# Patient Record
Sex: Female | Born: 1960
Health system: Southern US, Community
[De-identification: ages and names within clinical notes are randomized; demographics above are authoritative.]

## PROBLEM LIST (undated history)

## (undated) DIAGNOSIS — K222 Esophageal obstruction: Secondary | ICD-10-CM

## (undated) DIAGNOSIS — Z973 Presence of spectacles and contact lenses: Secondary | ICD-10-CM

## (undated) DIAGNOSIS — F32A Depression, unspecified: Secondary | ICD-10-CM

## (undated) DIAGNOSIS — J45909 Unspecified asthma, uncomplicated: Secondary | ICD-10-CM

## (undated) DIAGNOSIS — M549 Dorsalgia, unspecified: Secondary | ICD-10-CM

## (undated) DIAGNOSIS — K219 Gastro-esophageal reflux disease without esophagitis: Secondary | ICD-10-CM

## (undated) DIAGNOSIS — K6389 Other specified diseases of intestine: Secondary | ICD-10-CM

## (undated) DIAGNOSIS — K224 Dyskinesia of esophagus: Secondary | ICD-10-CM

## (undated) DIAGNOSIS — M419 Scoliosis, unspecified: Secondary | ICD-10-CM

## (undated) DIAGNOSIS — K638219 Small intestinal bacterial overgrowth, unspecified: Secondary | ICD-10-CM

## (undated) DIAGNOSIS — N200 Calculus of kidney: Secondary | ICD-10-CM

## (undated) DIAGNOSIS — R51 Headache: Secondary | ICD-10-CM

## (undated) DIAGNOSIS — K200 Eosinophilic esophagitis: Secondary | ICD-10-CM

## (undated) DIAGNOSIS — F419 Anxiety disorder, unspecified: Secondary | ICD-10-CM

## (undated) DIAGNOSIS — M810 Age-related osteoporosis without current pathological fracture: Secondary | ICD-10-CM

## (undated) DIAGNOSIS — K2970 Gastritis, unspecified, without bleeding: Secondary | ICD-10-CM

## (undated) DIAGNOSIS — M8580 Other specified disorders of bone density and structure, unspecified site: Secondary | ICD-10-CM

## (undated) DIAGNOSIS — R519 Headache, unspecified: Secondary | ICD-10-CM

## (undated) DIAGNOSIS — E559 Vitamin D deficiency, unspecified: Secondary | ICD-10-CM

## (undated) DIAGNOSIS — H1010 Acute atopic conjunctivitis, unspecified eye: Secondary | ICD-10-CM

## (undated) DIAGNOSIS — T7840XA Allergy, unspecified, initial encounter: Secondary | ICD-10-CM

## (undated) DIAGNOSIS — G8929 Other chronic pain: Secondary | ICD-10-CM

## (undated) DIAGNOSIS — K589 Irritable bowel syndrome without diarrhea: Secondary | ICD-10-CM

## (undated) DIAGNOSIS — J302 Other seasonal allergic rhinitis: Secondary | ICD-10-CM

## (undated) DIAGNOSIS — F329 Major depressive disorder, single episode, unspecified: Secondary | ICD-10-CM

## (undated) HISTORY — DX: Vitamin D deficiency, unspecified: E55.9

## (undated) HISTORY — DX: Dyskinesia of esophagus: K22.4

## (undated) HISTORY — DX: Other specified disorders of bone density and structure, unspecified site: M85.80

## (undated) HISTORY — PX: APPENDECTOMY: SHX54

## (undated) HISTORY — PX: ESOPHAGEAL DILATION: SHX303

## (undated) HISTORY — DX: Headache: R51

## (undated) HISTORY — PX: BREAST EXCISIONAL BIOPSY: SUR124

## (undated) HISTORY — DX: Depression, unspecified: F32.A

## (undated) HISTORY — DX: Acute atopic conjunctivitis, unspecified eye: H10.10

## (undated) HISTORY — DX: Esophageal obstruction: K22.2

## (undated) HISTORY — DX: Allergy, unspecified, initial encounter: T78.40XA

## (undated) HISTORY — DX: Headache, unspecified: R51.9

## (undated) HISTORY — DX: Presence of spectacles and contact lenses: Z97.3

## (undated) HISTORY — DX: Other seasonal allergic rhinitis: J30.2

## (undated) HISTORY — DX: Age-related osteoporosis without current pathological fracture: M81.0

## (undated) HISTORY — PX: LITHOTRIPSY: SUR834

## (undated) HISTORY — DX: Dorsalgia, unspecified: M54.9

## (undated) HISTORY — DX: Anxiety disorder, unspecified: F41.9

## (undated) HISTORY — DX: Other specified diseases of intestine: K63.89

## (undated) HISTORY — DX: Other chronic pain: G89.29

## (undated) HISTORY — DX: Scoliosis, unspecified: M41.9

## (undated) HISTORY — DX: Gastritis, unspecified, without bleeding: K29.70

## (undated) HISTORY — DX: Eosinophilic esophagitis: K20.0

## (undated) HISTORY — DX: Small intestinal bacterial overgrowth, unspecified: K63.8219

---
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
## 1984-03-24 HISTORY — PX: KIDNEY STONE SURGERY: SHX686

## 2004-06-18 ENCOUNTER — Emergency Department (HOSPITAL_COMMUNITY): Admission: EM | Admit: 2004-06-18 | Discharge: 2004-06-18 | Payer: Self-pay | Admitting: Emergency Medicine

## 2005-03-17 ENCOUNTER — Emergency Department (HOSPITAL_COMMUNITY): Admission: EM | Admit: 2005-03-17 | Discharge: 2005-03-17 | Payer: Self-pay | Admitting: Emergency Medicine

## 2010-12-28 ENCOUNTER — Emergency Department (HOSPITAL_COMMUNITY)
Admission: EM | Admit: 2010-12-28 | Discharge: 2010-12-28 | Disposition: A | Discharge status: Home / Self Care | Payer: 59 | Attending: Emergency Medicine | Admitting: Emergency Medicine

## 2010-12-28 DIAGNOSIS — R42 Dizziness and giddiness: Secondary | ICD-10-CM | POA: Insufficient documentation

## 2010-12-28 DIAGNOSIS — R55 Syncope and collapse: Secondary | ICD-10-CM | POA: Insufficient documentation

## 2010-12-28 DIAGNOSIS — F411 Generalized anxiety disorder: Secondary | ICD-10-CM | POA: Insufficient documentation

## 2010-12-28 LAB — POCT I-STAT, CHEM 8
Calcium, Ion: 1.19 mmol/L (ref 1.12–1.32)
Creatinine, Ser: 0.9 mg/dL (ref 0.50–1.10)
Hemoglobin: 12.6 g/dL (ref 12.0–15.0)
Sodium: 138 mEq/L (ref 135–145)
TCO2: 23 mmol/L (ref 0–100)

## 2010-12-28 LAB — URINALYSIS, ROUTINE W REFLEX MICROSCOPIC
Bilirubin Urine: NEGATIVE
Ketones, ur: 15 mg/dL — AB
Protein, ur: NEGATIVE mg/dL
Urobilinogen, UA: 0.2 mg/dL (ref 0.0–1.0)

## 2010-12-28 LAB — URINE MICROSCOPIC-ADD ON

## 2011-03-25 HISTORY — PX: COLONOSCOPY: SHX174

## 2013-03-24 DIAGNOSIS — N200 Calculus of kidney: Secondary | ICD-10-CM

## 2013-03-24 HISTORY — DX: Calculus of kidney: N20.0

## 2013-06-03 ENCOUNTER — Other Ambulatory Visit (HOSPITAL_COMMUNITY): Payer: Self-pay | Admitting: Chiropractic Medicine

## 2013-06-03 ENCOUNTER — Ambulatory Visit (HOSPITAL_COMMUNITY)
Admission: RE | Admit: 2013-06-03 | Discharge: 2013-06-03 | Disposition: A | Discharge status: Home / Self Care | Payer: BC Managed Care – PPO | Source: Ambulatory Visit | Attending: Chiropractic Medicine | Admitting: Chiropractic Medicine

## 2013-06-03 DIAGNOSIS — M419 Scoliosis, unspecified: Secondary | ICD-10-CM

## 2013-06-03 DIAGNOSIS — M4120 Other idiopathic scoliosis, site unspecified: Secondary | ICD-10-CM | POA: Insufficient documentation

## 2013-06-30 ENCOUNTER — Emergency Department (HOSPITAL_BASED_OUTPATIENT_CLINIC_OR_DEPARTMENT_OTHER)
Admission: EM | Admit: 2013-06-30 | Discharge: 2013-06-30 | Disposition: A | Discharge status: Home / Self Care | Payer: BC Managed Care – PPO | Attending: Emergency Medicine | Admitting: Emergency Medicine

## 2013-06-30 ENCOUNTER — Encounter (HOSPITAL_BASED_OUTPATIENT_CLINIC_OR_DEPARTMENT_OTHER): Payer: Self-pay | Admitting: Emergency Medicine

## 2013-06-30 DIAGNOSIS — K219 Gastro-esophageal reflux disease without esophagitis: Secondary | ICD-10-CM | POA: Insufficient documentation

## 2013-06-30 DIAGNOSIS — R1013 Epigastric pain: Secondary | ICD-10-CM | POA: Insufficient documentation

## 2013-06-30 DIAGNOSIS — R197 Diarrhea, unspecified: Secondary | ICD-10-CM | POA: Insufficient documentation

## 2013-06-30 DIAGNOSIS — R319 Hematuria, unspecified: Secondary | ICD-10-CM | POA: Insufficient documentation

## 2013-06-30 DIAGNOSIS — R110 Nausea: Secondary | ICD-10-CM | POA: Insufficient documentation

## 2013-06-30 DIAGNOSIS — Z87442 Personal history of urinary calculi: Secondary | ICD-10-CM | POA: Insufficient documentation

## 2013-06-30 DIAGNOSIS — J45909 Unspecified asthma, uncomplicated: Secondary | ICD-10-CM | POA: Insufficient documentation

## 2013-06-30 DIAGNOSIS — Z3202 Encounter for pregnancy test, result negative: Secondary | ICD-10-CM | POA: Insufficient documentation

## 2013-06-30 DIAGNOSIS — Z79899 Other long term (current) drug therapy: Secondary | ICD-10-CM | POA: Insufficient documentation

## 2013-06-30 HISTORY — DX: Calculus of kidney: N20.0

## 2013-06-30 HISTORY — DX: Gastro-esophageal reflux disease without esophagitis: K21.9

## 2013-06-30 HISTORY — DX: Irritable bowel syndrome, unspecified: K58.9

## 2013-06-30 HISTORY — DX: Unspecified asthma, uncomplicated: J45.909

## 2013-06-30 LAB — CBC
HEMATOCRIT: 36.4 % (ref 36.0–46.0)
HEMOGLOBIN: 12.6 g/dL (ref 12.0–15.0)
MCH: 32.8 pg (ref 26.0–34.0)
MCHC: 34.6 g/dL (ref 30.0–36.0)
MCV: 94.8 fL (ref 78.0–100.0)
Platelets: 325 10*3/uL (ref 150–400)
RBC: 3.84 MIL/uL — AB (ref 3.87–5.11)
RDW: 11.7 % (ref 11.5–15.5)
WBC: 11.9 10*3/uL — ABNORMAL HIGH (ref 4.0–10.5)

## 2013-06-30 LAB — COMPREHENSIVE METABOLIC PANEL
ALT: 13 U/L (ref 0–35)
AST: 19 U/L (ref 0–37)
Albumin: 4 g/dL (ref 3.5–5.2)
Alkaline Phosphatase: 73 U/L (ref 39–117)
BUN: 13 mg/dL (ref 6–23)
CALCIUM: 9.4 mg/dL (ref 8.4–10.5)
CO2: 26 meq/L (ref 19–32)
CREATININE: 0.7 mg/dL (ref 0.50–1.10)
Chloride: 102 mEq/L (ref 96–112)
GLUCOSE: 111 mg/dL — AB (ref 70–99)
Potassium: 4.5 mEq/L (ref 3.7–5.3)
SODIUM: 140 meq/L (ref 137–147)
Total Bilirubin: 0.5 mg/dL (ref 0.3–1.2)
Total Protein: 7.1 g/dL (ref 6.0–8.3)

## 2013-06-30 LAB — URINALYSIS, ROUTINE W REFLEX MICROSCOPIC
Bilirubin Urine: NEGATIVE
Glucose, UA: NEGATIVE mg/dL
KETONES UR: 15 mg/dL — AB
LEUKOCYTES UA: NEGATIVE
NITRITE: NEGATIVE
PROTEIN: NEGATIVE mg/dL
Specific Gravity, Urine: 1.026 (ref 1.005–1.030)
UROBILINOGEN UA: 0.2 mg/dL (ref 0.0–1.0)
pH: 5.5 (ref 5.0–8.0)

## 2013-06-30 LAB — PREGNANCY, URINE: Preg Test, Ur: NEGATIVE

## 2013-06-30 LAB — URINE MICROSCOPIC-ADD ON

## 2013-06-30 LAB — LIPASE, BLOOD: Lipase: 32 U/L (ref 11–59)

## 2013-06-30 MED ORDER — GI COCKTAIL ~~LOC~~
30.0000 mL | Freq: Once | ORAL | Status: AC
Start: 2013-06-30 — End: 2013-06-30
  Administered 2013-06-30: 30 mL via ORAL
  Filled 2013-06-30: qty 30

## 2013-06-30 NOTE — ED Notes (Signed)
Patient asked to provide clean catch urine sample & to change into a gown.

## 2013-06-30 NOTE — ED Notes (Signed)
Pt c/o upper abdominal pain since this morning that radiated around right flank "one time". Pt reports 1 episode of diarrhea today, denies n/v. Reports h/o kidney stones and reflux. GI Dr is [facility].

## 2013-06-30 NOTE — ED Provider Notes (Signed)
CSN: [id number]     Arrival date & time 06/30/13  1209 History   First MD Initiated Contact with Patient 06/30/13 1335     Chief Complaint  Patient presents with  . Abdominal Pain     (Consider location/radiation/quality/duration/timing/severity/associated sxs/prior Treatment) HPI 53 year old female presents with acute epigastric abdominal pain that started this morning. She states that she woke up normal and then after her couple sips of coffee had acute epigastric pain. She states the pain is been constant since then. His mostly been mild, like a 1/10 pain, but fluctuates into a severe 7/10 pain. Patient states that when the pain is bad she's been getting nauseous. She denies any constipation. She did have one loose, watery bowel movements today but otherwise has not had any diarrhea. She states one time the pain was bad seem to radiate across her abdomen to the right. She currently has no right upper quadrant pain now. No urinary symptoms. She got off her period last week. She has a history of kidney stones states this is totally different than her normal kidney stones. At this time the pain is very mild 1/10 and she's not sure she wants any medicines. Denies any current nausea.  Past Medical History  Diagnosis Date  . IBS (irritable bowel syndrome)   . Asthma   . GERD (gastroesophageal reflux disease)   . Kidney stone    Past Surgical History  Procedure Laterality Date  . Appendectomy     No family history on file. History  Substance Use Topics  . Smoking status: Never Smoker   . Smokeless tobacco: Not on file  . Alcohol Use: 0.6 oz/week    1 Glasses of wine per week   OB History   Grav Para Term Preterm Abortions TAB SAB Ect Mult Living                 Review of Systems  Constitutional: Negative for fever.  Respiratory: Negative for shortness of breath.   Cardiovascular: Negative for chest pain.  Gastrointestinal: Positive for nausea, abdominal pain and diarrhea. Negative  for vomiting and constipation.  Genitourinary: Negative for dysuria, hematuria, decreased urine volume and menstrual problem.  Musculoskeletal: Negative for back pain.  All other systems reviewed and are negative.     Allergies  Review of patient's allergies indicates no known allergies.  Home Medications   Current Outpatient Rx  Name  Route  Sig  Dispense  Refill  . albuterol (PROVENTIL HFA;VENTOLIN HFA) 108 (90 BASE) MCG/ACT inhaler   Inhalation   Inhale into the lungs every 6 (six) hours as needed for wheezing or shortness of breath.         . Esomeprazole Magnesium (NEXIUM PO)   Oral   Take by mouth.          BP 120/64  Pulse 70  Temp(Src) 97.9 F (36.6 C) (Oral)  Resp 16  Ht 5\' 2"  (1.575 m)  Wt 105 lb (47.628 kg)  BMI 19.20 kg/m2  SpO2 98%  LMP 06/22/2013 Physical Exam  Nursing note and vitals reviewed. Constitutional: She is oriented to person, place, and time. She appears well-developed and well-nourished. No distress.  HENT:  Head: Normocephalic and atraumatic.  Right Ear: External ear normal.  Left Ear: External ear normal.  Nose: Nose normal.  Eyes: Right eye exhibits no discharge. Left eye exhibits no discharge.  Cardiovascular: Normal rate, regular rhythm and normal heart sounds.   Pulmonary/Chest: Effort normal and breath sounds normal.  Abdominal: Soft.  She exhibits no distension. There is tenderness (very mild).  Neurological: She is alert and oriented to person, place, and time.  Skin: Skin is warm and dry.    ED Course  Procedures (including critical care time) Labs Review Labs Reviewed  URINALYSIS, ROUTINE W REFLEX MICROSCOPIC - Abnormal; Notable for the following:    Hgb urine dipstick TRACE (*)    Ketones, ur 15 (*)    All other components within normal limits  URINE MICROSCOPIC-ADD ON - Abnormal; Notable for the following:    Squamous Epithelial / LPF FEW (*)    Bacteria, UA MANY (*)    All other components within normal limits   CBC - Abnormal; Notable for the following:    WBC 11.9 (*)    RBC 3.84 (*)    All other components within normal limits  COMPREHENSIVE METABOLIC PANEL - Abnormal; Notable for the following:    Glucose, Bld 111 (*)    All other components within normal limits  PREGNANCY, URINE  LIPASE, BLOOD   Imaging Review No results found.   EKG Interpretation None      MDM   Final diagnoses:  Epigastric abdominal pain    Patient has trace hematuria on urinalysis but no other signs of a kidney stone. She states she's had multiple ureteral stones and not similar. Her abdominal exam is benign with minimal to no tenderness on exam. Her pain could have been coming from gallbladder but as she has no right upper quadrant tenderness now do not feel imaging is needed. She has no urinary symptoms. GI cocktail provided minimal relief. At this time she feels well to go home and there is no significant or obvious cause of her pain. As she is somewhat tender but her symptoms started right after eating and drinking I have very low suspicion that this of chest pathology. Discussed strict return precautions.    Ephraim Hamburger, MD 06/30/13 [phone]

## 2013-06-30 NOTE — Discharge Instructions (Signed)

## 2014-03-09 LAB — HM MAMMOGRAPHY

## 2014-05-30 ENCOUNTER — Telehealth: Payer: Self-pay | Admitting: Medical

## 2014-05-30 ENCOUNTER — Encounter: Payer: Self-pay | Admitting: Medical

## 2014-05-30 ENCOUNTER — Ambulatory Visit (INDEPENDENT_AMBULATORY_CARE_PROVIDER_SITE_OTHER): Payer: 59 | Admitting: Medical

## 2014-05-30 VITALS — BP 100/80 | HR 59 | Resp 16 | Wt 106.0 lb

## 2014-05-30 DIAGNOSIS — M545 Low back pain, unspecified: Secondary | ICD-10-CM

## 2014-05-30 DIAGNOSIS — G8929 Other chronic pain: Secondary | ICD-10-CM

## 2014-05-30 DIAGNOSIS — M4120 Other idiopathic scoliosis, site unspecified: Secondary | ICD-10-CM

## 2014-05-30 DIAGNOSIS — M8580 Other specified disorders of bone density and structure, unspecified site: Secondary | ICD-10-CM | POA: Diagnosis not present

## 2014-05-30 MED ORDER — NAPROXEN 375 MG PO TABS: ORAL_TABLET | ORAL | Status: DC

## 2014-05-30 MED ORDER — METHOCARBAMOL 500 MG PO TABS: ORAL_TABLET | ORAL | Status: DC

## 2014-05-30 NOTE — Telephone Encounter (Signed)
Refer to [facility] for scoliosis, chronic back pain, but worse of recent months.   Send copy of my notes, 2015 xray in chart

## 2014-05-30 NOTE — Telephone Encounter (Signed)
I fax over the patients information to [facility]. The new patient coordinator will contact the patient for the appointment  [facility] [address] [place], [place] # [phone]

## 2014-05-30 NOTE — Progress Notes (Signed)
Subjective Here as a new patient to establish.  Was seeing primary care in [facility], but PCM retired.   Usually not sick much, so no major prior medical problems.    Here for c/o chronic back pain, hx/o scoliosis.  As she has gotten older, back gets worse, interferes with sleep and Yoga.  Does Yoga 2 times per week,works out with trainer twice weekly.  Lately the pain is so bad can't do what she wants.  Pain even aggravates her ability to do Yoga.  Mattress is relatively new.  Not a medication person, but sometimes uses Advil.  Saw back specialist years ago.   Last time for specialist 1990s.   Works as an Optometrist, Engineer, maintenance (IT), sits a lot.  Gets massage, Lenore Manner, body therapy routinely.  Never has seen PT.  Has seen chiropractor from time to time.  Wants to avoid medication daily, and wants to get some new recommendations.  No other aggravating or relieving factors.   Eats healthy, nonsmoker.  Gets mild asthma flares in spring and fall, uses Flovent during seasons, occasional use of albuterol.  No other complaint.   Objective: BP 100/80 mmHg  Pulse 59  Resp 16  Wt 106 lb (48.081 kg)  Gen: wd, wn, lean white female Skin: warm, dry Back: rightward scoliosis, mild tenderness along right paraspinal region, full ROM but with some pain with ROM MSK: legs nontneder, normal ROM, no deformity Ext: no edema Pulses normal  Neuro: normal LE strength, sensation and DTRs    Assessment:  Encounter Diagnoses  Name Primary?  . Idiopathic scoliosis Yes  . Chronic low back pain   . Osteopenia     Plan: Reviewed 2015 xray.  Discussed her concerns.  Given her degrees of scoliosis, long hx/o pain, and desire for options, referral to [facility].  Gave robaxin and Naprosyn for prn use.  discussed risks/benefits of medication.   F/u soon for physical.

## 2014-06-01 ENCOUNTER — Telehealth: Payer: Self-pay | Admitting: Medical

## 2014-06-01 NOTE — Telephone Encounter (Signed)
Records received from previous provider. I am sending back for review.

## 2014-06-02 NOTE — Telephone Encounter (Signed)
Records was sorted and given to [employer]

## 2014-06-05 ENCOUNTER — Telehealth: Payer: Self-pay | Admitting: Family Medicine

## 2014-06-05 NOTE — Telephone Encounter (Signed)
Patient has an appointment at [facility] on 06/20/14 @ 200 pm. Patient is aware of her appointment. [address] [place], [place] [address] [phone]

## 2014-06-06 NOTE — Telephone Encounter (Signed)
error 

## 2014-06-20 ENCOUNTER — Telehealth: Payer: Self-pay | Admitting: Medical

## 2014-06-20 ENCOUNTER — Encounter: Payer: Self-pay | Admitting: Medical

## 2014-06-20 ENCOUNTER — Ambulatory Visit (INDEPENDENT_AMBULATORY_CARE_PROVIDER_SITE_OTHER): Payer: 59 | Admitting: Medical

## 2014-06-20 VITALS — BP 120/80 | HR 63 | Temp 97.7°F | Wt 109.0 lb

## 2014-06-20 DIAGNOSIS — M8580 Other specified disorders of bone density and structure, unspecified site: Secondary | ICD-10-CM | POA: Diagnosis not present

## 2014-06-20 DIAGNOSIS — J309 Allergic rhinitis, unspecified: Secondary | ICD-10-CM | POA: Diagnosis not present

## 2014-06-20 DIAGNOSIS — K219 Gastro-esophageal reflux disease without esophagitis: Secondary | ICD-10-CM

## 2014-06-20 DIAGNOSIS — Z9889 Other specified postprocedural states: Secondary | ICD-10-CM | POA: Diagnosis not present

## 2014-06-20 DIAGNOSIS — E559 Vitamin D deficiency, unspecified: Secondary | ICD-10-CM

## 2014-06-20 DIAGNOSIS — Z0000 Encounter for general adult medical examination without abnormal findings: Secondary | ICD-10-CM

## 2014-06-20 DIAGNOSIS — J4530 Mild persistent asthma, uncomplicated: Secondary | ICD-10-CM | POA: Diagnosis not present

## 2014-06-20 DIAGNOSIS — Z2821 Immunization not carried out because of patient refusal: Secondary | ICD-10-CM

## 2014-06-20 LAB — LIPID PANEL
Cholesterol: 209 mg/dL — ABNORMAL HIGH (ref 0–200)
HDL: 80 mg/dL (ref >=46)
LDL CALC: 110 mg/dL — AB (ref 0–99)
TRIGLYCERIDES: 93 mg/dL (ref <150)
Total CHOL/HDL Ratio: 2.6 Ratio
VLDL: 19 mg/dL (ref 0–40)

## 2014-06-20 LAB — POCT URINALYSIS DIPSTICK
Bilirubin, UA: NEGATIVE
Glucose, UA: NEGATIVE
KETONES UA: NEGATIVE
Leukocytes, UA: NEGATIVE
Nitrite, UA: NEGATIVE
Protein, UA: NEGATIVE
Spec Grav, UA: 1.025
Urobilinogen, UA: NEGATIVE
pH, UA: 6

## 2014-06-20 NOTE — Telephone Encounter (Signed)
Set up for bone density scan, send copy of 2010 study

## 2014-06-20 NOTE — Progress Notes (Signed)
Subjective:   HPI  [name] is a 54 y.o. female who presents for a complete physical.  Medical care team includes:  Dr. Margaretmary Bayley, GI  Dr. Posey Pronto, gynecology  Dr. Denton Lank, optometry  Dr. Jeannetta Ellis, dentist Glade Lloyd, DAVID Audelia Acton, PA-C here for primary care   Preventative care: Last ophthalmology visit:yes Dr. Denton Lank Last dental visit:yes Dr. Jeannetta Ellis Last colonoscopy:yes 3 to 5 years  Last mammogram:yes 11/ 2015 Last gynecological exam: Dr. Posey Pronto, 09/2014 Last EKG:12/2010 Last labs: 07/2013  Prior vaccinations: TD or Tdap:with in the last 10 years Influenza:never Pneumococcal:n/a Shingles/Zostavax:n/a  Concerns: None in particular.  Her history is significant this past year for hospitalization for esophageal stricture, dilatated and then subsequent rupture with repair, long history of GERD, long history of intermittent kidney stones. The past year has been in good health without much problem  Last period 4 months ago, starting slow down, reaching menopause age. no current significant other  No recent flare of asthma but typically gets some flareup in spring or fall, uses albuterol intermittently, will start Zyrtec soon for allergies. Has used Flonase and Flovent in the past Reviewed their medical, surgical, family, social, medication, and allergy history and updated chart as appropriate.  Past Medical History  Diagnosis Date  . IBS (irritable bowel syndrome)   . Asthma   . GERD (gastroesophageal reflux disease)   . Scoliosis   . Kidney stone 2015    gets every few years, hx/o calcium oxalate stones  . Seasonal allergic rhinitis   . Seasonal allergic conjunctivitis   . Wears glasses   . Anxiety   . Headache     occasional  . Chronic back pain   . Osteopenia   . Vitamin D deficiency     Past Surgical History  Procedure Laterality Date  . Appendectomy    . Esophageal dilation      [place], [place]  . Esophagoplasty  06/7338    complication of  esophageal dilitation, hospitalization at [facility]  . Colonoscopy  2013    [place], [place]  . Kidney stone surgery  1986  . Lithotripsy      4x prior as of 05/2014    History   Social History  . Marital Status: Single    Spouse Name: N/A  . Number of Children: N/A  . Years of Education: N/A   Occupational History  . Not on file.   Social History Main Topics  . Smoking status: Never Smoker   . Smokeless tobacco: Not on file  . Alcohol Use: 4.2 oz/week    7 Glasses of wine per week  . Drug Use: No  . Sexual Activity: Not on file   Other Topics Concern  . 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 Diabetes Father   . Kidney Stones Father   . Diabetes Sister   . Kidney Stones Brother   . Migraines Brother   . Heart disease Maternal Grandfather   . Cancer Neg Hx   . Stroke Neg Hx   . Hypertension Neg Hx      Current outpatient prescriptions:  .  albuterol (PROVENTIL HFA;VENTOLIN HFA) 108 (90 BASE) MCG/ACT inhaler, Inhale into the lungs every 6 (six) hours as needed for wheezing or shortness of breath., Disp: , Rfl:  .  methocarbamol (ROBAXIN) 500  MG tablet, 1 tablet up to TID prn, Disp: 30 tablet, Rfl: 0 .  naproxen (NAPROSYN) 375 MG tablet, 1 tablet po up to BID prn, Disp: 60 tablet, Rfl: 0  No Known Allergies   Review of Systems Constitutional: -fever, -chills, -sweats, -unexpected weight change, -decreased appetite, -fatigue Allergy: -sneezing, +itching, -congestion Dermatology: -changing moles, --rash, -lumps ENT: -runny nose, -ear pain, -sore throat, -hoarseness, -sinus pain, -teeth pain, +ringing in ears(seen ear doctor), -hearing loss, -nosebleeds Cardiology: -chest pain, -palpitations, -swelling, -difficulty breathing when lying flat, -waking up short of breath Respiratory: -cough, -shortness of breath, -difficulty  breathing with exercise or exertion, -wheezing, -coughing up blood Gastroenterology: -abdominal pain, -nausea, -vomiting, -diarrhea, -constipation, -blood in stool, -changes in bowel movement, -difficulty swallowing or eating Hematology: -bleeding, -bruising  Musculoskeletal: -joint aches, -muscle aches, -joint swelling, +back pain, -neck pain, -cramping, -changes in gait Ophthalmology: denies vision changes, eye redness, itching, discharge Urology: -burning with urination, -difficulty urinating, -blood in urine, -urinary frequency, -urgency, -incontinence Neurology: -headache, -weakness, -tingling, -numbness, -memory loss, -falls, -dizziness Psychology: -depressed mood, -agitation, -sleep problems     Objective:   Physical Exam  Blood pressure 120/80, pulse 63, temperature 97.7 F (36.5 C), temperature source Oral, weight 109 lb (49.442 kg).   General appearance: alert, no distress, WD/WN, lean white female Skin: Scattered macules no particular worrisome lesions HEENT: normocephalic, conjunctiva/corneas normal, sclerae anicteric, PERRLA, EOMi, nares patent, no discharge or erythema, pharynx normal Oral cavity: MMM, tongue normal, teeth normal Neck: supple, no lymphadenopathy, no thyromegaly, no masses, normal ROM, no bruits Chest: non tender, normal shape and expansion Heart: RRR, normal S1, S2, no murmurs Lungs: CTA bilaterally, no wheezes, rhonchi, or rales Abdomen: +bs, soft, non tender, non distended, no masses, no hepatomegaly, no splenomegaly, no bruits Back: rightward scoliosis, mild tenderness along right paraspinal region, full ROM but with some pain with ROM Musculoskeletal: upper extremities non tender, no obvious deformity, normal ROM throughout, lower extremities non tender, no obvious deformity, normal ROM throughout Extremities: no edema, no cyanosis, no clubbing Pulses: 2+ symmetric, upper and lower extremities, normal cap refill Neurological: alert, oriented x 3, CN2-12  intact, strength normal upper extremities and lower extremities, sensation normal throughout, DTRs 2+ throughout, no cerebellar signs, gait normal Psychiatric: normal affect, behavior normal, pleasant  Breast/gyn/rectal-deferred to gynecology     Assessment and Plan :    Encounter Diagnoses  Name Primary?  . Encounter for health maintenance examination in adult Yes  . Asthma, mild persistent, uncomplicated   . Allergic rhinitis, unspecified allergic rhinitis type   . Gastroesophageal reflux disease without esophagitis   . Vitamin D deficiency   . Osteopenia   . 23-polyvalent pneumococcal polysaccharide vaccine declined   . Influenza vaccination declined     Physical exam - discussed healthy lifestyle, diet, exercise, preventative care, vaccinations, and addressed their concerns.  She is up to date on mammogram, pap, colonsocopy.   We will refer back for updated bone density scan.  Last one was 2010 showing osteopenia. Reviewed 2015 and prior comprehensive labs. Lipid and Vit D level today Will update chart record, reviewed length prior records and old chart. Asthma - albuterol prn.   She has used flovent in the past, usually allergies are a trigger call return if worsening symptoms Allergic rhinitis and conjunctiva - restart Zyrtec GERD-discussed risk and benefits of neck him, and she will try Zantac over-the-counter for the next several weeks to see if this works okay to potentially come off Nexium Osteopenia-discussed the need for continued  good healthy diet and exercise including weight-bearing exercise, needs to get vitamin D in the diet Declines flu and pneumococcal vaccines Follow-up pending labs

## 2014-06-20 NOTE — Telephone Encounter (Signed)
Fax orders over to [facility] and they will contact the patient

## 2014-06-21 LAB — VITAMIN D 25 HYDROXY (VIT D DEFICIENCY, FRACTURES): Vit D, 25-Hydroxy: 60 ng/mL (ref 30–100)

## 2014-06-29 LAB — HM DEXA SCAN

## 2014-07-03 ENCOUNTER — Other Ambulatory Visit: Payer: Self-pay | Admitting: Orthopaedic Surgery

## 2014-07-03 DIAGNOSIS — M47817 Spondylosis without myelopathy or radiculopathy, lumbosacral region: Secondary | ICD-10-CM

## 2014-07-05 ENCOUNTER — Encounter: Payer: Self-pay | Admitting: Internal Medicine

## 2014-07-14 ENCOUNTER — Ambulatory Visit
Admission: RE | Admit: 2014-07-14 | Discharge: 2014-07-14 | Disposition: A | Discharge status: Home / Self Care | Payer: 59 | Source: Ambulatory Visit | Attending: Orthopaedic Surgery | Admitting: Orthopaedic Surgery

## 2014-07-14 DIAGNOSIS — M47817 Spondylosis without myelopathy or radiculopathy, lumbosacral region: Secondary | ICD-10-CM

## 2014-07-19 ENCOUNTER — Telehealth: Payer: Self-pay | Admitting: Family Medicine

## 2014-07-19 NOTE — Telephone Encounter (Signed)
I don't see results in the computer so please check the scan file or find the result

## 2014-07-19 NOTE — Telephone Encounter (Signed)
I called over over to [facility] to get another copy of her bone density scan because I look up front and i didn't see a copy anywhwere I called the patient and told her that I got her message and that I was calling [facility] to get a copy of the bone density and we would contact her before the week is out

## 2014-07-19 NOTE — Telephone Encounter (Signed)
Patient called inquiring about her Bone density scan she had done a few weeks ago.

## 2014-07-20 NOTE — Telephone Encounter (Signed)
I'll await the result

## 2014-07-23 HISTORY — PX: ESOPHAGOPLASTY: SUR459

## 2014-07-25 ENCOUNTER — Telehealth: Payer: Self-pay | Admitting: Medical

## 2014-07-25 ENCOUNTER — Encounter: Payer: Self-pay | Admitting: Medical

## 2014-07-25 NOTE — Telephone Encounter (Signed)
LMOM TO CB. CLS 

## 2014-07-25 NOTE — Telephone Encounter (Signed)
I reviewed the new bone density scan from 06/29/14.   Although it shows low bone mass/osteopenia, the 10 year risk of major osteoporosis fracture is 6.4% .  So the treatment recommendations at this point is to maintain a normal or stable weight, eat a good variety of foods, get 1200mg  of calcium daily, get at least 1000 IU of Vitamin D daily, exercise regularly including weight bearing exercise, and consider repeat testing in 5 years.

## 2014-07-25 NOTE — Telephone Encounter (Signed)
Patient is aware of her BOne density results in detail and she understood.

## 2014-07-31 ENCOUNTER — Encounter: Payer: Self-pay | Admitting: Internal Medicine

## 2014-08-16 ENCOUNTER — Ambulatory Visit: Payer: Self-pay | Admitting: Internal Medicine

## 2014-09-01 ENCOUNTER — Telehealth: Payer: Self-pay | Admitting: Medical

## 2014-09-01 NOTE — Telephone Encounter (Signed)
Pt. Called requesting referrals for a  Dermatologist and a chiropractor. Pt. Last OV was 06-20-14.    Dermatologist: Dr. Wende Bushy [id number] [address] Phone# [phone]   Chiropractor:  Dr. Estill Batten [address] [facility] Phone # [phone]

## 2014-09-05 NOTE — Telephone Encounter (Signed)
Are these 2 referral okay to do?

## 2014-09-06 NOTE — Telephone Encounter (Signed)
pls handle referrals. I believe she already sees these people but needs the official referral.

## 2014-09-06 NOTE — Telephone Encounter (Signed)
Both referAL's are done and complete.  Dr. Bobby Rumpf REFERENCE # [id number] FAX TO [facility]  DR. Gilpin #[id number] FAX # [phone] [employer] ONLINE  PATIENT IS AWARE THAT BOTH REFERRALS ARE DONE AND FAX OVER

## 2014-10-06 ENCOUNTER — Encounter: Payer: Self-pay | Admitting: Internal Medicine

## 2014-10-18 ENCOUNTER — Encounter: Payer: Self-pay | Admitting: *Deleted

## 2015-02-14 ENCOUNTER — Telehealth: Payer: Self-pay

## 2015-02-14 MED ORDER — ALBUTEROL SULFATE HFA 108 (90 BASE) MCG/ACT IN AERS: 2.0000 | INHALATION_SPRAY | Freq: Four times a day (QID) | RESPIRATORY_TRACT | Status: DC | PRN

## 2015-02-14 NOTE — Telephone Encounter (Signed)
Tell her that I refill the Proventil but I see no use of Flovent recently. If she needs a medication, then have her set up an appointment

## 2015-02-14 NOTE — Telephone Encounter (Signed)
I will call in the albuterol  However I do not see Flovent been used recently. Recommend she return here for follow-up visit if she needs that refilled

## 2015-02-14 NOTE — Telephone Encounter (Signed)
Pt said she will call next week and make appt about other inhaler

## 2015-02-14 NOTE — Telephone Encounter (Signed)
Refill requested for flovent and proventil inhaler. [name] is not in and she said they are empty

## 2015-03-13 ENCOUNTER — Encounter: Payer: Self-pay | Admitting: Family Medicine

## 2015-03-13 ENCOUNTER — Ambulatory Visit (INDEPENDENT_AMBULATORY_CARE_PROVIDER_SITE_OTHER): Payer: 59 | Admitting: Family Medicine

## 2015-03-13 VITALS — BP 118/68 | HR 64 | Resp 16 | Wt 118.6 lb

## 2015-03-13 DIAGNOSIS — J4531 Mild persistent asthma with (acute) exacerbation: Secondary | ICD-10-CM

## 2015-03-13 DIAGNOSIS — J45909 Unspecified asthma, uncomplicated: Secondary | ICD-10-CM

## 2015-03-13 MED ORDER — FLUTICASONE PROPIONATE HFA 220 MCG/ACT IN AERO: 2.0000 | INHALATION_SPRAY | Freq: Two times a day (BID) | RESPIRATORY_TRACT | Status: DC

## 2015-03-13 MED ORDER — ALBUTEROL SULFATE (2.5 MG/3ML) 0.083% IN NEBU
2.5000 mg | INHALATION_SOLUTION | Freq: Once | RESPIRATORY_TRACT | Status: AC
  Administered 2015-03-13: 2.5 mg via RESPIRATORY_TRACT

## 2015-03-13 NOTE — Progress Notes (Signed)
   Subjective:    Patient ID: [name], female    DOB: [birth year], 54 y.o.   MRN: [id number]  HPI Chief Complaint  Patient presents with  . asthma    asthma- having to use inhaler more. coughing some.    She is here with an acute complaint. She reports having an asthma flare that has worsened over the past 4-5 days. She has been coughing, tightness in chest and wheezing. States she is having to use her albuterol approximately twice daily starting a week ago and has been having to use it every 4 hours for past 2 days.  States she typically has a flare in the month of November but she did not have one last month. Denies recent illness, fever, chills, body aches, congestion, sore throat. She does not smoke. Unknown  States she was diagnosed with asthma at age 82. Mildew, mold, dust are triggers and sometimes in spring and fall (November). Has taken Flovent in past as needed during those times. She has been out of Flovent inhaler for past several weeks but states she has not needed it until this past week. States she does not take ICS daily, only when she notices that she needs to use her rescue inhaler.    Reviewed allergies, medications, past medical and social history.   Review of Systems Pertinent positives and negatives in the history of present illness.    Objective:   Physical Exam BP 118/68 mmHg  Pulse 64  Resp 16  Wt 118 lb 9.6 oz (53.797 kg)  SpO2 97% Alert and in no distress. Tympanic membranes and canals are normal. Pharyngeal area is normal. Neck is supple without adenopathy.  Cardiac exam shows a regular sinus rhythm without murmurs or gallops. Lung fields diffuse wheezing and diminished breath sounds, right lung fields greater than left. Patient unable to take deep breaths prior to breathing treatment without coughing. Post breathing treatment lungs clear without wheezing.   Peak flow meter:  220, 235, 250 prior to breathing treatment 330, 340, 330 after breathing  treatment     Assessment & Plan:  Asthma with exacerbation, mild persistent - Plan: fluticasone (FLOVENT HFA) 220 MCG/ACT inhaler, albuterol (PROVENTIL) (2.5 MG/3ML) 0.083% nebulizer solution 2.5 mg  Discussed that she is moving air much better after the breathing treatment and is no longer wheezing. Offered steroid injection, patient states she does not feel like she needs it. Offered oral steroids, patient declined and states she would like to try using her Flovent and will call if she is not improving or continues to need her albuterol inhaler. Encouraged her to start using her peak flow meter at home so she would know when she is having a flare.

## 2015-03-13 NOTE — Patient Instructions (Signed)
Asthma, Adult Asthma is a recurring condition in which the airways tighten and narrow. Asthma can make it difficult to breathe. It can cause coughing, wheezing, and shortness of breath. Asthma episodes, also called asthma attacks, range from minor to life-threatening. Asthma cannot be cured, but medicines and lifestyle changes can help control it. CAUSES Asthma is believed to be caused by inherited (genetic) and environmental factors, but its exact cause is unknown. Asthma may be triggered by allergens, lung infections, or irritants in the air. Asthma triggers are different for each person. Common triggers include:   Animal dander.  Dust mites.  Cockroaches.  Pollen from trees or grass.  Mold.  Smoke.  Air pollutants such as dust, household cleaners, hair sprays, aerosol sprays, paint fumes, strong chemicals, or strong odors.  Cold air, weather changes, and winds (which increase molds and pollens in the air).  Strong emotional expressions such as crying or laughing hard.  Stress.  Certain medicines (such as aspirin) or types of drugs (such as beta-blockers).  Sulfites in foods and drinks. Foods and drinks that may contain sulfites include dried fruit, potato chips, and sparkling grape juice.  Infections or inflammatory conditions such as the flu, a cold, or an inflammation of the nasal membranes (rhinitis).  Gastroesophageal reflux disease (GERD).  Exercise or strenuous activity. SYMPTOMS Symptoms may occur immediately after asthma is triggered or many hours later. Symptoms include:  Wheezing.  Excessive nighttime or early morning coughing.  Frequent or severe coughing with a common cold.  Chest tightness.  Shortness of breath. DIAGNOSIS  The diagnosis of asthma is made by a review of your medical history and a physical exam. Tests may also be performed. These may include:  Lung function studies. These tests show how much air you breathe in and out.  Allergy  tests.  Imaging tests such as X-rays. TREATMENT  Asthma cannot be cured, but it can usually be controlled. Treatment involves identifying and avoiding your asthma triggers. It also involves medicines. There are 2 classes of medicine used for asthma treatment:   Controller medicines. These prevent asthma symptoms from occurring. They are usually taken every day.  Reliever or rescue medicines. These quickly relieve asthma symptoms. They are used as needed and provide short-term relief. Your health care provider will help you create an asthma action plan. An asthma action plan is a written plan for managing and treating your asthma attacks. It includes a list of your asthma triggers and how they may be avoided. It also includes information on when medicines should be taken and when their dosage should be changed. An action plan may also involve the use of a device called a peak flow meter. A peak flow meter measures how well the lungs are working. It helps you monitor your condition. HOME CARE INSTRUCTIONS   Take medicines only as directed by your health care provider. Speak with your health care provider if you have questions about how or when to take the medicines.  Use a peak flow meter as directed by your health care provider. Record and keep track of readings.  Understand and use the action plan to help minimize or stop an asthma attack without needing to seek medical care.  Control your home environment in the following ways to help prevent asthma attacks:  Do not smoke. Avoid being exposed to secondhand smoke.  Change your heating and air conditioning filter regularly.  Limit your use of fireplaces and wood stoves.  Get rid of pests (such as roaches   and mice) and their droppings.  Throw away plants if you see mold on them.  Clean your floors and dust regularly. Use unscented cleaning products.  Try to have someone else vacuum for you regularly. Stay out of rooms while they are  being vacuumed and for a short while afterward. If you vacuum, use a dust mask from a hardware store, a double-layered or microfilter vacuum cleaner bag, or a vacuum cleaner with a HEPA filter.  Replace carpet with wood, tile, or vinyl flooring. Carpet can trap dander and dust.  Use allergy-proof pillows, mattress covers, and box spring covers.  Wash bed sheets and blankets every week in hot water and dry them in a dryer.  Use blankets that are made of polyester or cotton.  Clean bathrooms and kitchens with bleach. If possible, have someone repaint the walls in these rooms with mold-resistant paint. Keep out of the rooms that are being cleaned and painted.  Wash hands frequently. SEEK MEDICAL CARE IF:   You have wheezing, shortness of breath, or a cough even if taking medicine to prevent attacks.  The colored mucus you cough up (sputum) is thicker than usual.  Your sputum changes from clear or white to yellow, green, gray, or bloody.  You have any problems that may be related to the medicines you are taking (such as a rash, itching, swelling, or trouble breathing).  You are using a reliever medicine more than 2-3 times per week.  Your peak flow is still at 50-79% of your personal best after following your action plan for 1 hour.  You have a fever. SEEK IMMEDIATE MEDICAL CARE IF:   You seem to be getting worse and are unresponsive to treatment during an asthma attack.  You are short of breath even at rest.  You get short of breath when doing very little physical activity.  You have difficulty eating, drinking, or talking due to asthma symptoms.  You develop chest pain.  You develop a fast heartbeat.  You have a bluish color to your lips or fingernails.  You are light-headed, dizzy, or faint.  Your peak flow is less than 50% of your personal best.   This information is not intended to replace advice given to you by your health care provider. Make sure you discuss any  questions you have with your health care provider.   Document Released: 03/10/2005 Document Revised: 11/29/2014 Document Reviewed: 10/07/2012 Elsevier Interactive Patient Education 2016 [employer]  

## 2015-03-14 ENCOUNTER — Telehealth: Payer: Self-pay | Admitting: Internal Medicine

## 2015-03-14 MED ORDER — PREDNISONE 10 MG (21) PO TBPK: ORAL_TABLET | ORAL | Status: DC

## 2015-03-14 NOTE — Telephone Encounter (Signed)
Patient called stating she was not able to pick up flovent and pharmacy had to order it but she wanted to go ahead and get a steriod dose pack to help not this out. Vickie, NP-C oked this and i have sent this in to pharmacy

## 2015-09-13 ENCOUNTER — Encounter (HOSPITAL_COMMUNITY): Payer: Self-pay | Admitting: Family Medicine

## 2015-09-13 ENCOUNTER — Emergency Department (HOSPITAL_COMMUNITY): Payer: BLUE CROSS/BLUE SHIELD

## 2015-09-13 ENCOUNTER — Emergency Department (HOSPITAL_COMMUNITY)
Admission: EM | Admit: 2015-09-13 | Discharge: 2015-09-13 | Disposition: A | Discharge status: Home / Self Care | Payer: BLUE CROSS/BLUE SHIELD | Attending: Emergency Medicine | Admitting: Emergency Medicine

## 2015-09-13 DIAGNOSIS — W540XXA Bitten by dog, initial encounter: Secondary | ICD-10-CM | POA: Insufficient documentation

## 2015-09-13 DIAGNOSIS — Z79899 Other long term (current) drug therapy: Secondary | ICD-10-CM | POA: Diagnosis not present

## 2015-09-13 DIAGNOSIS — S6991XA Unspecified injury of right wrist, hand and finger(s), initial encounter: Secondary | ICD-10-CM | POA: Diagnosis present

## 2015-09-13 DIAGNOSIS — S61552A Open bite of left wrist, initial encounter: Secondary | ICD-10-CM | POA: Diagnosis not present

## 2015-09-13 DIAGNOSIS — Y999 Unspecified external cause status: Secondary | ICD-10-CM | POA: Insufficient documentation

## 2015-09-13 DIAGNOSIS — S92514A Nondisplaced fracture of proximal phalanx of right lesser toe(s), initial encounter for closed fracture: Secondary | ICD-10-CM | POA: Insufficient documentation

## 2015-09-13 DIAGNOSIS — Y929 Unspecified place or not applicable: Secondary | ICD-10-CM | POA: Insufficient documentation

## 2015-09-13 DIAGNOSIS — S61551A Open bite of right wrist, initial encounter: Secondary | ICD-10-CM | POA: Diagnosis not present

## 2015-09-13 DIAGNOSIS — Y939 Activity, unspecified: Secondary | ICD-10-CM | POA: Insufficient documentation

## 2015-09-13 DIAGNOSIS — J45909 Unspecified asthma, uncomplicated: Secondary | ICD-10-CM | POA: Diagnosis not present

## 2015-09-13 MED ORDER — LIDOCAINE HCL 2 % IJ SOLN
5.0000 mL | Freq: Once | INTRAMUSCULAR | Status: AC
  Administered 2015-09-13: 100 mg via INTRADERMAL
  Filled 2015-09-13: qty 20

## 2015-09-13 MED ORDER — BACITRACIN ZINC 500 UNIT/GM EX OINT: TOPICAL_OINTMENT | Freq: Two times a day (BID) | CUTANEOUS | Status: DC

## 2015-09-13 MED ORDER — AMOXICILLIN-POT CLAVULANATE 875-125 MG PO TABS: 1.0000 | ORAL_TABLET | Freq: Two times a day (BID) | ORAL | Status: DC

## 2015-09-13 MED ORDER — AMOXICILLIN-POT CLAVULANATE 875-125 MG PO TABS
1.0000 | ORAL_TABLET | Freq: Once | ORAL | Status: AC
  Administered 2015-09-13: 1 via ORAL
  Filled 2015-09-13: qty 1

## 2015-09-13 MED ORDER — ACETAMINOPHEN 500 MG PO TABS
1000.0000 mg | ORAL_TABLET | Freq: Once | ORAL | Status: AC
  Administered 2015-09-13: 1000 mg via ORAL
  Filled 2015-09-13: qty 2

## 2015-09-13 MED ORDER — HYDROCODONE-ACETAMINOPHEN 5-325 MG PO TABS: 1.0000 | ORAL_TABLET | Freq: Four times a day (QID) | ORAL | Status: DC | PRN

## 2015-09-13 MED ORDER — TETANUS-DIPHTH-ACELL PERTUSSIS 5-2.5-18.5 LF-MCG/0.5 IM SUSP
0.5000 mL | Freq: Once | INTRAMUSCULAR | Status: AC
  Administered 2015-09-13: 0.5 mL via INTRAMUSCULAR
  Filled 2015-09-13: qty 0.5

## 2015-09-13 NOTE — Progress Notes (Signed)
Orthopedic Tech Progress Note Patient Details:  [name] [birth year] [id number]  Ortho Devices Type of Ortho Device: Finger splint Ortho Device/Splint Location: rue 5th finger Ortho Device/Splint Interventions: Ordered, Application   Karolee Stamps 09/13/2015, 10:41 PM

## 2015-09-13 NOTE — Discharge Instructions (Signed)
Remove the bandages and wash your wounds daily with soap and water and place a thin layer of bacitracin ointment over the wounds and cover with a sterile bandage. Signs of infection include redness drainage from the wounds, more pain or swelling. Return or see your doctor if you feel you may be developing an infection. Make sure that you take the antibiotic as prescribed. Take Tylenol for mild pain or the pain medicine prescribed for bad pain. Don't take Tylenol together with the pain medicine prescribed as the combination can be dangerous to your liver. Call Dr. Levell July office tomorrow to schedule appointment within the next 7-10 days in his office to recheck your broken finger and splint.

## 2015-09-13 NOTE — ED Notes (Addendum)
Pt here for multiple dog bites/puncture wounds to bilateral arms. sts that the dog got its rabies shot today and was a shelter dog. Pt having pain and numbness in right hand into pinky finger. Pt left arm pain radiating into FA above the puncture wound

## 2015-09-13 NOTE — ED Provider Notes (Addendum)
CSN: [id number]     Arrival date & time 09/13/15  1612 History   First MD Initiated Contact with Patient 09/13/15 2008     Chief Complaint  Patient presents with  . Animal Bite     (Consider location/radiation/quality/duration/timing/severity/associated sxs/prior Treatment) HPI Patient was bitten by dog on both wrists this afternoon. The dog was adopted today from an [employer]. He received his rabies vaccine earlier today. It was an unprovoked attack. Patient complains of pain at site of bite wounds at bilateral wrists she reports pain in her right fifth finger the denies numbness. No other injuries. No other associated symptoms. She treated herself by washing her wounds immediate prior to coming here. Past Medical History  Diagnosis Date  . IBS (irritable bowel syndrome)   . Asthma   . GERD (gastroesophageal reflux disease)   . Scoliosis   . Kidney stone 2015    gets every few years, hx/o calcium oxalate stones  . Seasonal allergic rhinitis   . Seasonal allergic conjunctivitis   . Wears glasses   . Anxiety   . Headache     occasional  . Chronic back pain   . Osteopenia   . Vitamin D deficiency    Past Surgical History  Procedure Laterality Date  . Appendectomy    . Esophageal dilation      [place], [place]  . Esophagoplasty  0000000    complication of esophageal dilitation, hospitalization at [facility]  . Colonoscopy  2013    [place], [place]  . Kidney stone surgery  1986  . Lithotripsy      4x prior as of 05/2014   Family History  Problem Relation Age of Onset  . Diabetes Father   . Kidney Stones Father   . Diabetes Sister   . Kidney Stones Brother   . Migraines Brother   . Heart disease Maternal Grandfather   . Cancer Neg Hx   . Stroke Neg Hx   . Hypertension Neg Hx    Social History  Substance Use Topics  . Smoking status: Never Smoker   . Smokeless tobacco: None  . Alcohol Use: 4.2 oz/week    7 Glasses of wine per week   OB History    No data  available     Review of Systems  Constitutional: Negative.   Skin: Positive for wound.  Allergic/Immunologic: Negative.       Allergies  Review of patient's allergies indicates no known allergies.  Home Medications   Prior to Admission medications   Medication Sig Start Date End Date Taking? Authorizing Provider  albuterol (PROVENTIL HFA;VENTOLIN HFA) 108 (90 BASE) MCG/ACT inhaler Inhale 2 puffs into the lungs every 6 (six) hours as needed for wheezing or shortness of breath. 02/14/15  Yes Denita Lung, MD  fluticasone (FLOVENT HFA) 220 MCG/ACT inhaler Inhale 2 puffs into the lungs 2 (two) times daily. 03/13/15  Yes Vickie Hedy Camara, NP  LORazepam (ATIVAN) 0.5 MG tablet Take 0.5 mg by mouth every 8 (eight) hours as needed for anxiety.   Yes Historical Provider, MD  progesterone (PROMETRIUM) 200 MG capsule Take 200 mg by mouth daily. 06/21/15 06/20/16 Yes Historical Provider, MD  predniSONE (STERAPRED UNI-PAK 21 TAB) 10 MG (21) TBPK tablet Use as directed 03/14/15   Girtha Rm, NP   BP 121/70 mmHg  Pulse 71  Temp(Src) 98.3 F (36.8 C) (Oral)  Resp 18  SpO2 100% Physical Exam  Constitutional: She appears well-developed and well-nourished. No distress.  HENT:  Head: Normocephalic and atraumatic.  Eyes: EOM are normal. Pupils are equal, round, and reactive to light.  Neck: Neck supple.  Cardiovascular: Normal rate.   Pulmonary/Chest: Effort normal. No respiratory distress.  Abdominal: Soft. She exhibits no distension.  Musculoskeletal: Normal range of motion. She exhibits no edema or tenderness.  Right upper extremity with 3 puncture wounds on the wrist. With corresponding tenderness. Radial pulse 2+. Fifth finger slightly ecchymotic at proximal phalanx. No swolling, minimally tender. Wrist and hand with full range of motion. Radial pulse 2+. Good capillary refill. Left upper extremity with 2 puncture wounds at wrist. With corresponding tenderness. DP pulse 2+. Hand and wrist  with full range of motion. Good capillary refill. Radial pulse 2+.  Neurological: She is alert. Coordination normal.  Skin: Skin is warm and dry. No rash noted.  Psychiatric: She has a normal mood and affect.  Nursing note and vitals reviewed.   ED Course  Procedures (including critical care time) Labs Review Labs Reviewed - No data to display  Imaging Review No results found. I have personally reviewed and evaluated these images and lab results as part of my medical decision-making.   EKG Interpretation None     Procedure puncture wounds on bilateral wrists were numbed locally with 2% lidocaine, total lidocaine used 2 ml. Copious irrigated with tap water. Bacitracin ointment placed on wounds with sterile bandages. Tdap administered Finger Splint placed on patient's right fifth finger by orthopedic technician is comfortable for patient. 10:40 PM states pain is under control after treatment with Tylenol X-rays viewed by me Results for orders placed or performed in visit on 10/18/14  [facility]  Result Value Ref Range   [facility]    Dg Wrist Complete Left  09/13/2015  CLINICAL DATA:  Multiple dog bites/puncture wounds to bilateral arms and right hand. EXAM: LEFT WRIST - COMPLETE 3+ VIEW COMPARISON:  None. FINDINGS: There is no evidence of fracture or dislocation. There is no evidence of arthropathy or other focal bone abnormality. Soft tissues are unremarkable. IMPRESSION: Negative. Electronically Signed   By: Marin Olp M.D.   On: 09/13/2015 21:42   Dg Wrist Complete Right  09/13/2015  CLINICAL DATA:  Multiple dog bites with puncture wounds bilateral arms and right hand. EXAM: RIGHT WRIST - COMPLETE 3+ VIEW COMPARISON:  None. FINDINGS: There is no evidence of fracture or dislocation. There is no evidence of arthropathy or other focal bone abnormality. Soft tissues are unremarkable. IMPRESSION: Negative. Electronically Signed   By: Marin Olp M.D.   On:  09/13/2015 21:41   Dg Finger Little Right  09/13/2015  CLINICAL DATA:  Multiple dog bites/puncture wounds to bilateral arms and right hand today. EXAM: RIGHT LITTLE FINGER 2+V COMPARISON:  None. FINDINGS: There is an oblique nondisplaced fracture involving the fifth proximal phalanx. Remainder the exam is within normal. IMPRESSION: Nondisplaced oblique fracture of the fifth proximal phalanx. Electronically Signed   By: Marin Olp M.D.   On: 09/13/2015 21:44    MDM  Plan prescription Norco, Augmentin. Referral Dr. Fredna Dow, hand surgeon for follow-up of finger fracture on the local wound care for Diagnosis #1 multiple dog bites #2 closed fracture to right fifth finger Final diagnoses:  None        Orlie Dakin, MD 09/13/15 [place], MD 10/11/15 1404

## 2015-09-14 ENCOUNTER — Ambulatory Visit (HOSPITAL_BASED_OUTPATIENT_CLINIC_OR_DEPARTMENT_OTHER): Payer: BLUE CROSS/BLUE SHIELD | Admitting: Anesthesiology

## 2015-09-14 ENCOUNTER — Other Ambulatory Visit: Payer: Self-pay | Admitting: Orthopedic Surgery

## 2015-09-14 ENCOUNTER — Encounter (HOSPITAL_BASED_OUTPATIENT_CLINIC_OR_DEPARTMENT_OTHER): Payer: Self-pay | Admitting: *Deleted

## 2015-09-14 ENCOUNTER — Ambulatory Visit (HOSPITAL_BASED_OUTPATIENT_CLINIC_OR_DEPARTMENT_OTHER)
Admission: RE | Admit: 2015-09-14 | Discharge: 2015-09-14 | Disposition: A | Discharge status: Home / Self Care | Payer: BLUE CROSS/BLUE SHIELD | Source: Ambulatory Visit | Attending: Orthopedic Surgery | Admitting: Orthopedic Surgery

## 2015-09-14 ENCOUNTER — Encounter (HOSPITAL_BASED_OUTPATIENT_CLINIC_OR_DEPARTMENT_OTHER)
Admission: RE | Disposition: A | Discharge status: Home / Self Care | Payer: Self-pay | Source: Ambulatory Visit | Attending: Orthopedic Surgery

## 2015-09-14 DIAGNOSIS — S51852A Open bite of left forearm, initial encounter: Secondary | ICD-10-CM | POA: Insufficient documentation

## 2015-09-14 DIAGNOSIS — S62646A Nondisplaced fracture of proximal phalanx of right little finger, initial encounter for closed fracture: Secondary | ICD-10-CM | POA: Insufficient documentation

## 2015-09-14 DIAGNOSIS — F419 Anxiety disorder, unspecified: Secondary | ICD-10-CM | POA: Insufficient documentation

## 2015-09-14 DIAGNOSIS — Z79890 Hormone replacement therapy (postmenopausal): Secondary | ICD-10-CM | POA: Diagnosis not present

## 2015-09-14 DIAGNOSIS — W540XXA Bitten by dog, initial encounter: Secondary | ICD-10-CM | POA: Diagnosis not present

## 2015-09-14 DIAGNOSIS — J45909 Unspecified asthma, uncomplicated: Secondary | ICD-10-CM | POA: Insufficient documentation

## 2015-09-14 DIAGNOSIS — S61451A Open bite of right hand, initial encounter: Secondary | ICD-10-CM | POA: Diagnosis not present

## 2015-09-14 DIAGNOSIS — S51851A Open bite of right forearm, initial encounter: Secondary | ICD-10-CM | POA: Diagnosis not present

## 2015-09-14 DIAGNOSIS — Z79899 Other long term (current) drug therapy: Secondary | ICD-10-CM | POA: Insufficient documentation

## 2015-09-14 HISTORY — PX: INCISION AND DRAINAGE ABSCESS: SHX5864

## 2015-09-14 SURGERY — INCISION AND DRAINAGE, ABSCESS
Anesthesia: General | Site: Arm Lower | Laterality: Bilateral

## 2015-09-14 MED ORDER — MIDAZOLAM HCL 2 MG/2ML IJ SOLN
INTRAMUSCULAR | Status: AC
  Filled 2015-09-14: qty 2

## 2015-09-14 MED ORDER — CHLORHEXIDINE GLUCONATE 4 % EX LIQD: 60.0000 mL | Freq: Once | CUTANEOUS | Status: DC

## 2015-09-14 MED ORDER — HYDROCODONE-ACETAMINOPHEN 5-325 MG PO TABS: ORAL_TABLET | ORAL | Status: DC

## 2015-09-14 MED ORDER — AMPICILLIN-SULBACTAM SODIUM 3 (2-1) G IJ SOLR
INTRAMUSCULAR | Status: AC
  Filled 2015-09-14: qty 3

## 2015-09-14 MED ORDER — BUPIVACAINE HCL (PF) 0.25 % IJ SOLN
INTRAMUSCULAR | Status: DC | PRN
  Administered 2015-09-14: 15 mL

## 2015-09-14 MED ORDER — FENTANYL CITRATE (PF) 100 MCG/2ML IJ SOLN
INTRAMUSCULAR | Status: DC | PRN
  Administered 2015-09-14: 100 ug via INTRAVENOUS

## 2015-09-14 MED ORDER — PROMETHAZINE HCL 25 MG/ML IJ SOLN: 6.2500 mg | INTRAMUSCULAR | Status: DC | PRN

## 2015-09-14 MED ORDER — EPHEDRINE SULFATE 50 MG/ML IJ SOLN
INTRAMUSCULAR | Status: DC | PRN
  Administered 2015-09-14: 10 mg via INTRAVENOUS

## 2015-09-14 MED ORDER — HYDROMORPHONE HCL 1 MG/ML IJ SOLN: 0.2500 mg | INTRAMUSCULAR | Status: DC | PRN

## 2015-09-14 MED ORDER — LIDOCAINE 2% (20 MG/ML) 5 ML SYRINGE
INTRAMUSCULAR | Status: DC | PRN
  Administered 2015-09-14: 80 mg via INTRAVENOUS

## 2015-09-14 MED ORDER — AMPICILLIN-SULBACTAM SODIUM 3 (2-1) G IJ SOLR
3.0000 g | INTRAMUSCULAR | Status: DC | PRN
  Administered 2015-09-14: 3 g via INTRAVENOUS

## 2015-09-14 MED ORDER — DEXAMETHASONE SODIUM PHOSPHATE 4 MG/ML IJ SOLN
INTRAMUSCULAR | Status: DC | PRN
  Administered 2015-09-14: 10 mg via INTRAVENOUS

## 2015-09-14 MED ORDER — LACTATED RINGERS IV SOLN
INTRAVENOUS | Status: DC
  Administered 2015-09-14: 16:00:00 via INTRAVENOUS
  Administered 2015-09-14: 10 mL/h via INTRAVENOUS

## 2015-09-14 MED ORDER — PROPOFOL 10 MG/ML IV BOLUS
INTRAVENOUS | Status: DC | PRN
  Administered 2015-09-14: 160 mg via INTRAVENOUS

## 2015-09-14 MED ORDER — MIDAZOLAM HCL 5 MG/5ML IJ SOLN
INTRAMUSCULAR | Status: DC | PRN
  Administered 2015-09-14: 2 mg via INTRAVENOUS

## 2015-09-14 MED ORDER — FENTANYL CITRATE (PF) 100 MCG/2ML IJ SOLN
INTRAMUSCULAR | Status: AC
  Filled 2015-09-14: qty 2

## 2015-09-14 SURGICAL SUPPLY — 51 items
BAG DECANTER FOR FLEXI CONT (MISCELLANEOUS) IMPLANT
BANDAGE ACE 3X5.8 VEL STRL LF (GAUZE/BANDAGES/DRESSINGS) ×2 IMPLANT
BLADE MINI RND TIP GREEN BEAV (BLADE) IMPLANT
BLADE SURG 15 STRL LF DISP TIS (BLADE) ×2 IMPLANT
BLADE SURG 15 STRL SS (BLADE) ×4
BNDG CMPR 9X4 STRL LF SNTH (GAUZE/BANDAGES/DRESSINGS) ×1
BNDG COHESIVE 1X5 TAN STRL LF (GAUZE/BANDAGES/DRESSINGS) IMPLANT
BNDG ELASTIC 2X5.8 VLCR STR LF (GAUZE/BANDAGES/DRESSINGS) IMPLANT
BNDG ESMARK 4X9 LF (GAUZE/BANDAGES/DRESSINGS) ×1 IMPLANT
BNDG GAUZE 1X2.1 STRL (MISCELLANEOUS) IMPLANT
BNDG GAUZE ELAST 4 BULKY (GAUZE/BANDAGES/DRESSINGS) ×1 IMPLANT
CHLORAPREP W/TINT 26ML (MISCELLANEOUS) ×2 IMPLANT
CORDS BIPOLAR (ELECTRODE) ×2 IMPLANT
COVER BACK TABLE 60X90IN (DRAPES) ×2 IMPLANT
COVER MAYO STAND STRL (DRAPES) ×4 IMPLANT
CUFF TOURNIQUET SINGLE 18IN (TOURNIQUET CUFF) ×3 IMPLANT
DRAPE EXTREMITY T 121X128X90 (DRAPE) ×4 IMPLANT
DRAPE SURG 17X23 STRL (DRAPES) ×4 IMPLANT
GAUZE IODOFORM PACK 1/2 7832 (GAUZE/BANDAGES/DRESSINGS) ×1 IMPLANT
GAUZE PACKING IODOFORM 1/4X15 (GAUZE/BANDAGES/DRESSINGS) IMPLANT
GAUZE SPONGE 4X4 12PLY STRL (GAUZE/BANDAGES/DRESSINGS) ×2 IMPLANT
GAUZE XEROFORM 1X8 LF (GAUZE/BANDAGES/DRESSINGS) ×2 IMPLANT
GLOVE BIO SURGEON STRL SZ7.5 (GLOVE) ×2 IMPLANT
GLOVE BIOGEL PI IND STRL 7.0 (GLOVE) IMPLANT
GLOVE BIOGEL PI IND STRL 8 (GLOVE) ×1 IMPLANT
GLOVE BIOGEL PI INDICATOR 7.0 (GLOVE) ×1
GLOVE BIOGEL PI INDICATOR 8 (GLOVE) ×1
GLOVE ECLIPSE 6.5 STRL STRAW (GLOVE) ×1 IMPLANT
GOWN STRL REUS W/ TWL LRG LVL3 (GOWN DISPOSABLE) ×1 IMPLANT
GOWN STRL REUS W/TWL LRG LVL3 (GOWN DISPOSABLE) ×2
GOWN STRL REUS W/TWL XL LVL3 (GOWN DISPOSABLE) ×2 IMPLANT
LOOP VESSEL MAXI BLUE (MISCELLANEOUS) IMPLANT
NDL HYPO 25X1 1.5 SAFETY (NEEDLE) IMPLANT
NEEDLE HYPO 25X1 1.5 SAFETY (NEEDLE) ×2 IMPLANT
NS IRRIG 1000ML POUR BTL (IV SOLUTION) ×2 IMPLANT
PACK BASIN DAY SURGERY FS (CUSTOM PROCEDURE TRAY) ×2 IMPLANT
PAD CAST 3X4 CTTN HI CHSV (CAST SUPPLIES) IMPLANT
PADDING CAST ABS 4INX4YD NS (CAST SUPPLIES) ×1
PADDING CAST ABS COTTON 4X4 ST (CAST SUPPLIES) ×1 IMPLANT
PADDING CAST COTTON 3X4 STRL (CAST SUPPLIES)
SPLINT PLASTER CAST XFAST 3X15 (CAST SUPPLIES) IMPLANT
SPLINT PLASTER XTRA FASTSET 3X (CAST SUPPLIES) ×10
STOCKINETTE 4X48 STRL (DRAPES) ×4 IMPLANT
SUT ETHILON 4 0 PS 2 18 (SUTURE) ×1 IMPLANT
SWAB COLLECTION DEVICE MRSA (MISCELLANEOUS) ×1 IMPLANT
SWAB CULTURE ESWAB REG 1ML (MISCELLANEOUS) ×1 IMPLANT
SYR BULB 3OZ (MISCELLANEOUS) ×2 IMPLANT
SYR CONTROL 10ML LL (SYRINGE) ×1 IMPLANT
TOWEL OR 17X24 6PK STRL BLUE (TOWEL DISPOSABLE) ×4 IMPLANT
TUBE FEEDING 5FR 15 INCH (TUBING) IMPLANT
UNDERPAD 30X30 (UNDERPADS AND DIAPERS) ×4 IMPLANT

## 2015-09-14 NOTE — Anesthesia Procedure Notes (Signed)
Procedure Name: LMA Insertion Date/Time: 09/14/2015 2:56 PM Performed by: Lyndee Leo Pre-anesthesia Checklist: Patient identified, Emergency Drugs available, Suction available and Patient being monitored Patient Re-evaluated:Patient Re-evaluated prior to inductionOxygen Delivery Method: Circle system utilized Preoxygenation: Pre-oxygenation with 100% oxygen Intubation Type: IV induction Ventilation: Mask ventilation without difficulty LMA: LMA inserted LMA Size: 3.0 Number of attempts: 1 Airway Equipment and Method: Bite block Placement Confirmation: positive ETCO2 Tube secured with: Tape Dental Injury: Teeth and Oropharynx as per pre-operative assessment

## 2015-09-14 NOTE — Anesthesia Preprocedure Evaluation (Addendum)
Anesthesia Evaluation  Patient identified by MRN, date of birth, ID band Patient awake    Reviewed: Allergy & Precautions, NPO status , Patient's Chart, lab work & pertinent test results  History of Anesthesia Complications Negative for: history of anesthetic complications  Airway Mallampati: I  TM Distance: >3 FB Neck ROM: Full    Dental  (+) Teeth Intact, Dental Advisory Given   Pulmonary asthma ,    breath sounds clear to auscultation       Cardiovascular negative cardio ROS   Rhythm:Regular Rate:Normal     Neuro/Psych  Headaches,    GI/Hepatic GERD  ,  Endo/Other  negative endocrine ROS  Renal/GU      Musculoskeletal   Abdominal   Peds  Hematology negative hematology ROS (+)   Anesthesia Other Findings   Reproductive/Obstetrics negative OB ROS                            Anesthesia Physical Anesthesia Plan  ASA: II and emergent  Anesthesia Plan: General   Post-op Pain Management:    Induction: Intravenous  Airway Management Planned: LMA  Additional Equipment:   Intra-op Plan:   Post-operative Plan: Extubation in OR  Informed Consent: I have reviewed the patients History and Physical, chart, labs and discussed the procedure including the risks, benefits and alternatives for the proposed anesthesia with the patient or authorized representative who has indicated his/her understanding and acceptance.     Plan Discussed with:   Anesthesia Plan Comments:        Anesthesia Quick Evaluation

## 2015-09-14 NOTE — H&P (Signed)
Sarath Talavera is an 55 y.o. female.   Chief Complaint: dog bites HPI: 55 yo female states she was bitten by rescue dog yesterday on bilateral forearms.  Seen at [facility] and wounds cleaned and started on antibiotics.  No fevers, chills, night sweats.  Worsening pain and swelling of right wrist wound.  Allergies: No Known Allergies  Past Medical History  Diagnosis Date  . IBS (irritable bowel syndrome)   . Asthma   . GERD (gastroesophageal reflux disease)   . Scoliosis   . Kidney stone 2015    gets every few years, hx/o calcium oxalate stones  . Seasonal allergic rhinitis   . Seasonal allergic conjunctivitis   . Wears glasses   . Anxiety   . Headache     occasional  . Chronic back pain   . Osteopenia   . Vitamin D deficiency     Past Surgical History  Procedure Laterality Date  . Appendectomy    . Esophageal dilation      [place], [place]  . Esophagoplasty  0000000    complication of esophageal dilitation, hospitalization at [facility]  . Colonoscopy  2013    [place], [place]  . Kidney stone surgery  1986  . Lithotripsy      4x prior as of 05/2014    Family History: Family History  Problem Relation Age of Onset  . Diabetes Father   . Kidney Stones Father   . Diabetes Sister   . Kidney Stones Brother   . Migraines Brother   . Heart disease Maternal Grandfather   . Cancer Neg Hx   . Stroke Neg Hx   . Hypertension Neg Hx     Social History:   reports that she has never smoked. She does not have any smokeless tobacco history on file. She reports that she drinks about 4.2 oz of alcohol per week. She reports that she does not use illicit drugs.  Medications: Medications Prior to Admission  Medication Sig Dispense Refill  . albuterol (PROVENTIL HFA;VENTOLIN HFA) 108 (90 BASE) MCG/ACT inhaler Inhale 2 puffs into the lungs every 6 (six) hours as needed for wheezing or shortness of breath. 1 Inhaler 0  . amoxicillin-clavulanate (AUGMENTIN) 875-125 MG tablet Take 1 tablet by  mouth 2 (two) times daily. One po bid x 7 days 14 tablet 0  . fluticasone (FLOVENT HFA) 220 MCG/ACT inhaler Inhale 2 puffs into the lungs 2 (two) times daily. 1 Inhaler 2  . HYDROcodone-acetaminophen (NORCO) 5-325 MG tablet Take 1-2 tablets by mouth every 6 (six) hours as needed for moderate pain or severe pain. 10 tablet 0  . LORazepam (ATIVAN) 0.5 MG tablet Take 0.5 mg by mouth every 8 (eight) hours as needed for anxiety.    . predniSONE (STERAPRED UNI-PAK 21 TAB) 10 MG (21) TBPK tablet Use as directed 21 tablet 0  . progesterone (PROMETRIUM) 200 MG capsule Take 200 mg by mouth daily.      No results found for this or any previous visit (from the past 48 hour(s)).  Dg Wrist Complete Left  09/13/2015  CLINICAL DATA:  Multiple dog bites/puncture wounds to bilateral arms and right hand. EXAM: LEFT WRIST - COMPLETE 3+ VIEW COMPARISON:  None. FINDINGS: There is no evidence of fracture or dislocation. There is no evidence of arthropathy or other focal bone abnormality. Soft tissues are unremarkable. IMPRESSION: Negative. Electronically Signed   By: Marin Olp M.D.   On: 09/13/2015 21:42   Dg Wrist Complete Right  09/13/2015  CLINICAL DATA:  Multiple dog bites with puncture wounds bilateral arms and right hand. EXAM: RIGHT WRIST - COMPLETE 3+ VIEW COMPARISON:  None. FINDINGS: There is no evidence of fracture or dislocation. There is no evidence of arthropathy or other focal bone abnormality. Soft tissues are unremarkable. IMPRESSION: Negative. Electronically Signed   By: Marin Olp M.D.   On: 09/13/2015 21:41   Dg Finger Little Right  09/13/2015  CLINICAL DATA:  Multiple dog bites/puncture wounds to bilateral arms and right hand today. EXAM: RIGHT LITTLE FINGER 2+V COMPARISON:  None. FINDINGS: There is an oblique nondisplaced fracture involving the fifth proximal phalanx. Remainder the exam is within normal. IMPRESSION: Nondisplaced oblique fracture of the fifth proximal phalanx. Electronically  Signed   By: Marin Olp M.D.   On: 09/13/2015 21:44     A comprehensive review of systems was negative.  There were no vitals taken for this visit.  General appearance: alert, cooperative and appears stated age Head: Normocephalic, without obvious abnormality, atraumatic Neck: supple, symmetrical, trachea midline Resp: clear to auscultation bilaterally Cardio: regular rate and rhythm GI: non-tender Extremities: Intact sensation and capillary refill all digits.  +epl/fpl/io.  Wounds on bilateral forearms.  Swelling, erythema, and ttp radial side right wrist. Pulses: 2+ and symmetric Skin: Skin color, texture, turgor normal. No rashes or lesions Neurologic: Grossly normal Incision/Wound:as above  Assessment/Plan Bilateral forearm dog bite wounds with concern for infection of 1st dorsal compartment on right.  Recommend incision and drainage of bilateral wounds and opening of 1st dorsal compartment on right.  Risks, benefits, and alternatives of surgery were discussed and the patient agrees with the plan of care.   Belladonna Lubinski R 09/14/2015, 1:39 PM

## 2015-09-14 NOTE — Transfer of Care (Signed)
Immediate Anesthesia Transfer of Care Note  Patient: [name]  Procedure(s) Performed: Procedure(s): INCISION AND DRAINAGE ABSCESS (Bilateral)  Patient Location: PACU  Anesthesia Type:General  Level of Consciousness: awake, sedated and patient cooperative  Airway & Oxygen Therapy: Patient Spontanous Breathing and Patient connected to face mask oxygen  Post-op Assessment: Report given to RN and Post -op Vital signs reviewed and stable  Post vital signs: Reviewed and stable  Last Vitals:  Filed Vitals:   09/14/15 1348  BP: 121/71  Pulse: 68  Temp: 36.4 C  Resp: 20    Last Pain:  Filed Vitals:   09/14/15 1349  PainSc: 3          Complications: No apparent anesthesia complications

## 2015-09-14 NOTE — Discharge Instructions (Addendum)

## 2015-09-14 NOTE — Anesthesia Postprocedure Evaluation (Signed)
Anesthesia Post Note  Patient: [name]  Procedure(s) Performed: Procedure(s) (LRB): INCISION AND DRAINAGE ABSCESS (Bilateral)  Patient location during evaluation: PACU Anesthesia Type: General Level of consciousness: awake and alert Pain management: pain level controlled Vital Signs Assessment: post-procedure vital signs reviewed and stable Respiratory status: spontaneous breathing, nonlabored ventilation, respiratory function stable and patient connected to nasal cannula oxygen Cardiovascular status: blood pressure returned to baseline and stable Postop Assessment: no signs of nausea or vomiting Anesthetic complications: no    Last Vitals:  Filed Vitals:   09/14/15 1615 09/14/15 1630  BP: 105/65 117/70  Pulse: 84 78  Temp:    Resp: 12 12    Last Pain:  Filed Vitals:   09/14/15 1634  PainSc: 0-No pain                 Ennifer Harston,JAMES TERRILL

## 2015-09-14 NOTE — Op Note (Signed)
[id number] 

## 2015-09-14 NOTE — Brief Op Note (Signed)
09/14/2015  3:51 PM  PATIENT:  [name]  55 y.o. female  PRE-OPERATIVE DIAGNOSIS:  Bilateral hand laceration from dog bites  POST-OPERATIVE DIAGNOSIS:  Bilateral hand laceration from dog bites  PROCEDURE:  Procedure(s): INCISION AND DRAINAGE ABSCESS (Bilateral)  SURGEON:  Surgeon(s) and Role:    * Leanora Cover, MD - Primary  PHYSICIAN ASSISTANT:   ASSISTANTS: none   ANESTHESIA:   general  EBL:  Total I/O In: 1000 [I.V.:1000] Out: -   BLOOD ADMINISTERED:none  DRAINS: iodoform packing  LOCAL MEDICATIONS USED:  MARCAINE     SPECIMEN:  Source of Specimen:  left wrist/forearm  DISPOSITION OF SPECIMEN:  micro  COUNTS:  YES  TOURNIQUET:   Total Tourniquet Time Documented: Upper Arm (Right) - 16 minutes Total: Upper Arm (Right) - 16 minutes   DICTATION: .Other Dictation: Dictation Number [id number]  PLAN OF CARE: Discharge to home after PACU  PATIENT DISPOSITION:  PACU - hemodynamically stable.

## 2015-09-17 ENCOUNTER — Encounter (HOSPITAL_BASED_OUTPATIENT_CLINIC_OR_DEPARTMENT_OTHER): Payer: Self-pay | Admitting: Orthopedic Surgery

## 2015-09-17 NOTE — Op Note (Addendum)
[name], [name]               ACCOUNT NO.:  [id number]  MEDICAL RECORD NO.:  [id number]  LOCATION:                                 FACILITY:  PHYSICIAN:  Leanora Cover, MD        DATE OF BIRTH:  [birth year]  DATE OF PROCEDURE:  09/14/2015 DATE OF DISCHARGE:                              OPERATIVE REPORT   PREOPERATIVE DIAGNOSIS:  Bilateral forearm dog bites with infection.  Right small finger proximal phalanx nondisplaced oblique fracture  POSTOPERATIVE DIAGNOSIS:  Bilateral forearm dog bites with infection.  Right small finger proximal phalanx nondisplaced oblique fracture.  PROCEDURES: Incision and drainage of bilateral forearm dog bites including:      1. Incision and drainage first dorsal compartment of right wrist including a removal of a fragment of bone from distal radius. 2. Incision and drainage wounds into subcutaneous tissues at the dorsal ulnar side of the right forearm 3. Incision and drainage of left forearm wounds into muscle on the     dorsum of the forearm 4. Incision and drainage left volar forearm into the subcutaneous tissues 5. Closed treatment of right small finger proximal phalanx shaft     fracture.  SURGEON:  Leanora Cover, MD  ASSISTANT:  None.  ANESTHESIA:  General.  IV FLUIDS:  Per anesthesia flow sheet.  ESTIMATED BLOOD LOSS:  Minimal.  COMPLICATIONS:  None.  SPECIMENS:  Cultures to Micro.  TOURNIQUET TIME:  16 minutes on the right, none on the left.  DISPOSITION:  Stable to PACU.  INDICATIONS:  [name] is a 55 year old female who was bitten by a rescue dog last evening.  She was sent to the [facility] Department where the wounds were irrigated and debrided and she was started on Augmentin.  She presented to the office today for further followup.  She noted worsening of pain, swelling and erythema at the radial aspect of the right wrist.  She was also found to have a fracture of the proximal phalanx of the right small  finger by radiographs in the emergency department.  I recommended to [name] going to the operating room for incision and drainage of the dog bite wounds.  Risks, benefits and alternatives of the surgery were discussed including the risk of blood loss; infection; damage to nerves, vessels, tendons, ligaments, bone; failure of surgery; need for additional surgery; complications with wound healing; continued pain; continued infection; need for repeat irrigation and debridement.  She voiced understanding of these risks and elected to proceed.  OPERATIVE COURSE:  After being identified preoperatively by myself, the patient and I agreed upon the procedure and site of procedure.  Surgical site was marked.  Risks, benefits and alternatives of the surgery were reviewed and she wished to proceed.  Surgical consent had been signed. Antibiotics were held for cultures.  She was transferred to the operating room and placed on the operating table in supine position with bilateral upper extremity on armboard.  General anesthesia was induced by Anesthesiology.  Right upper extremity was prepped and draped in normal sterile orthopedic fashion.  A surgical pause was performed between the surgeons, anesthesia and operating room staff, and all were in  agreement as to the patient, procedure, and site of procedure. Tourniquet at the proximal aspect of the extremity was inflated to 250 mmHg after exsanguination of the hand and forearm with an Esmarch bandage.  The wounds were explored.  There was no gross purulence.  One transverse wound on the dorsum of the wrist did not go through the dermis.  There were two wounds on the dorsal ulnar aspect of the forearm, which went into the subcutaneous tissues.  These were extended with the knife.  They were both less than a cm in size initially.  The puncture wound on the radial side of the wrist was extended in a zigzag fashion. There was a small branch of the  dorsal branch of the radial nerve, which had been damaged.  This was taken off as it was a very small branch going to the skin.  The main trunk of the nerve was intact without apparent damage.  The bite wounds coursed down to the dorsal radial aspect of the radius where there was a chipped fragment of bone with exposed cancellous bone.  The chipped fragment was removed sharply with the scissors.  The first dorsal compartment was opened.  The EPB and EPL tendons were all within one compartment.  There was synovitis, but no gross purulence.  The wounds were all copiously irrigated with sterile saline and then packed with 0.25-inch iodoform gauze.  A single 4-0 nylon suture was placed in the radial wound to prevent wound retraction. This did not reapproximate the skin edges.  The wounds were injected with 0.25% plain Marcaine to aid in postoperative analgesia.  They were dressed with sterile 4x4s and wrapped with a Kerlix bandage.  At the end of the case, a volar and dorsal slab splint including the long, ring, and small fingers was placed and wrapped with Ace bandage.  Tourniquet was deflated at 16 minutes.  All fingertips were pink with brisk capillary refill after deflation of the tourniquet.  Attention was turned to the left upper extremity.  The left upper extremity was prepped and draped in normal sterile orthopedic fashion.  A surgical pause was performed between the surgeons, anesthesia, and operating room staff, and all were in agreement as to the patient, procedure and site of procedure.  There were two wounds on the left forearm.  One volarly, that went into the subcutaneous tissues, which was less than a cm in length.  One dorsally, it was approximately 1.5 cm in length.  This went into the muscle.  No gross purulence was noted.  The wounds were extended sharply with the scissors.  They were then copiously irrigated with sterile saline and packed with 0.5-inch iodoform gauze.   They were dressed with sterile 4x4s and wrapped with Kerlix and Ace bandage. There was no tourniquet.  She tolerated the procedure well.  Operative drapes were broken down, she was awoken from anesthesia safely.  She was transferred back to stretcher and taken to PACU in stable condition.  I will see her back in the office in 3 days for postoperative followup. She will continue on Augmentin 875 mg p.o. b.i.d. x7 days.  I also gave her a prescription for Norco 5/325, 1-2 p.o. q.6 hours p.r.n. pain, dispensed #20.     Leanora Cover, MD   ______________________________ Leanora Cover, MD   KK/MEDQ  D:  09/14/2015  T:  09/14/2015  Job:  [id number]  Addendum (09/20/15): To clarify infected nature of wounds.

## 2015-09-19 LAB — AEROBIC/ANAEROBIC CULTURE W GRAM STAIN (SURGICAL/DEEP WOUND): Culture: NO GROWTH

## 2015-09-21 ENCOUNTER — Emergency Department (HOSPITAL_COMMUNITY): Payer: BLUE CROSS/BLUE SHIELD

## 2015-09-21 ENCOUNTER — Ambulatory Visit
Admission: RE | Admit: 2015-09-21 | Discharge: 2015-09-21 | Disposition: A | Discharge status: Home / Self Care | Payer: BLUE CROSS/BLUE SHIELD | Source: Ambulatory Visit | Attending: Family Medicine | Admitting: Family Medicine

## 2015-09-21 ENCOUNTER — Emergency Department (HOSPITAL_COMMUNITY)
Admission: EM | Admit: 2015-09-21 | Discharge: 2015-09-21 | Disposition: A | Discharge status: Home / Self Care | Payer: BLUE CROSS/BLUE SHIELD | Attending: Emergency Medicine | Admitting: Emergency Medicine

## 2015-09-21 ENCOUNTER — Ambulatory Visit (INDEPENDENT_AMBULATORY_CARE_PROVIDER_SITE_OTHER): Payer: BLUE CROSS/BLUE SHIELD | Admitting: Family Medicine

## 2015-09-21 ENCOUNTER — Encounter: Payer: Self-pay | Admitting: Family Medicine

## 2015-09-21 ENCOUNTER — Encounter (HOSPITAL_COMMUNITY): Payer: Self-pay | Admitting: Emergency Medicine

## 2015-09-21 VITALS — BP 116/70 | HR 62 | Temp 98.0°F | Resp 16 | Wt 111.4 lb

## 2015-09-21 DIAGNOSIS — Z792 Long term (current) use of antibiotics: Secondary | ICD-10-CM | POA: Diagnosis not present

## 2015-09-21 DIAGNOSIS — R079 Chest pain, unspecified: Secondary | ICD-10-CM | POA: Diagnosis not present

## 2015-09-21 DIAGNOSIS — Z79899 Other long term (current) drug therapy: Secondary | ICD-10-CM | POA: Diagnosis not present

## 2015-09-21 DIAGNOSIS — R0602 Shortness of breath: Secondary | ICD-10-CM | POA: Diagnosis not present

## 2015-09-21 DIAGNOSIS — J45909 Unspecified asthma, uncomplicated: Secondary | ICD-10-CM | POA: Diagnosis not present

## 2015-09-21 DIAGNOSIS — R791 Abnormal coagulation profile: Secondary | ICD-10-CM | POA: Diagnosis not present

## 2015-09-21 DIAGNOSIS — R7989 Other specified abnormal findings of blood chemistry: Secondary | ICD-10-CM

## 2015-09-21 DIAGNOSIS — R0789 Other chest pain: Secondary | ICD-10-CM | POA: Insufficient documentation

## 2015-09-21 DIAGNOSIS — J4520 Mild intermittent asthma, uncomplicated: Secondary | ICD-10-CM

## 2015-09-21 DIAGNOSIS — Z7951 Long term (current) use of inhaled steroids: Secondary | ICD-10-CM | POA: Insufficient documentation

## 2015-09-21 DIAGNOSIS — M419 Scoliosis, unspecified: Secondary | ICD-10-CM | POA: Diagnosis not present

## 2015-09-21 DIAGNOSIS — Z8719 Personal history of other diseases of the digestive system: Secondary | ICD-10-CM | POA: Diagnosis not present

## 2015-09-21 LAB — CBC WITH DIFFERENTIAL/PLATELET
BASOS ABS: 60 {cells}/uL (ref 0–200)
BASOS ABS: 0.1 10*3/uL (ref 0.0–0.1)
BASOS PCT: 1 %
Basophils Relative: 1 %
EOS ABS: 360 {cells}/uL (ref 15–500)
EOS ABS: 0.4 10*3/uL (ref 0.0–0.7)
EOS PCT: 5 %
Eosinophils Relative: 6 %
HCT: 38 % (ref 35.0–45.0)
HCT: 37.3 % (ref 36.0–46.0)
HEMOGLOBIN: 13.2 g/dL (ref 11.7–15.5)
Hemoglobin: 13.1 g/dL (ref 12.0–15.0)
LYMPHS ABS: 2220 {cells}/uL (ref 850–3900)
Lymphocytes Relative: 37 %
Lymphocytes Relative: 34 %
Lymphs Abs: 2.4 10*3/uL (ref 0.7–4.0)
MCH: 31.9 pg (ref 27.0–33.0)
MCH: 31.6 pg (ref 26.0–34.0)
MCHC: 34.7 g/dL (ref 32.0–36.0)
MCHC: 35.1 g/dL (ref 30.0–36.0)
MCV: 91.8 fL (ref 80.0–100.0)
MCV: 89.9 fL (ref 78.0–100.0)
MONO ABS: 420 {cells}/uL (ref 200–950)
MONO ABS: 0.5 10*3/uL (ref 0.1–1.0)
MONOS PCT: 7 %
MPV: 9.2 fL (ref 7.5–12.5)
Monocytes Relative: 7 %
NEUTROS ABS: 2940 {cells}/uL (ref 1500–7800)
Neutro Abs: 3.7 10*3/uL (ref 1.7–7.7)
Neutrophils Relative %: 49 %
Neutrophils Relative %: 53 %
PLATELETS: 400 10*3/uL (ref 140–400)
PLATELETS: 368 10*3/uL (ref 150–400)
RBC: 4.14 MIL/uL (ref 3.80–5.10)
RBC: 4.15 MIL/uL (ref 3.87–5.11)
RDW: 12.9 % (ref 11.0–15.0)
RDW: 12 % (ref 11.5–15.5)
WBC: 6 10*3/uL (ref 4.0–10.5)
WBC: 7.1 10*3/uL (ref 4.0–10.5)

## 2015-09-21 LAB — BASIC METABOLIC PANEL
ANION GAP: 8 (ref 5–15)
BUN: 23 mg/dL — AB (ref 6–20)
CALCIUM: 9.4 mg/dL (ref 8.9–10.3)
CO2: 27 mmol/L (ref 22–32)
Chloride: 105 mmol/L (ref 101–111)
Creatinine, Ser: 0.84 mg/dL (ref 0.44–1.00)
GFR calc Af Amer: >60 mL/min (ref >60)
Glucose, Bld: 91 mg/dL (ref 65–99)
POTASSIUM: 4.1 mmol/L (ref 3.5–5.1)
SODIUM: 140 mmol/L (ref 135–145)

## 2015-09-21 LAB — COMPREHENSIVE METABOLIC PANEL
ALBUMIN: 4.6 g/dL (ref 3.6–5.1)
ALK PHOS: 91 U/L (ref 33–130)
ALT: 30 U/L — AB (ref 6–29)
AST: 24 U/L (ref 10–35)
BUN: 15 mg/dL (ref 7–25)
CO2: 27 mmol/L (ref 20–31)
CREATININE: 0.87 mg/dL (ref 0.50–1.05)
Calcium: 9.6 mg/dL (ref 8.6–10.4)
Chloride: 102 mmol/L (ref 98–110)
Glucose, Bld: 106 mg/dL — ABNORMAL HIGH (ref 65–99)
POTASSIUM: 4.2 mmol/L (ref 3.5–5.3)
Sodium: 138 mmol/L (ref 135–146)
TOTAL PROTEIN: 7.2 g/dL (ref 6.1–8.1)
Total Bilirubin: 0.5 mg/dL (ref 0.2–1.2)

## 2015-09-21 LAB — I-STAT TROPONIN, ED: TROPONIN I, POC: 0 ng/mL (ref 0.00–0.08)

## 2015-09-21 LAB — D-DIMER, QUANTITATIVE (NOT AT ARMC): D DIMER QUANT: 0.72 ug{FEU}/mL — AB (ref <0.50)

## 2015-09-21 LAB — I-STAT BETA HCG BLOOD, ED (MC, WL, AP ONLY)

## 2015-09-21 MED ORDER — IOPAMIDOL (ISOVUE-370) INJECTION 76%
100.0000 mL | Freq: Once | INTRAVENOUS | Status: AC | PRN
  Administered 2015-09-21: 100 mL via INTRAVENOUS

## 2015-09-21 NOTE — Discharge Instructions (Signed)
Your CT scan today was negative for pulmonary embolus. Recommend that you follow-up with your primary care doctor next week. Return here for any new or worsening symptoms.

## 2015-09-21 NOTE — Patient Instructions (Signed)
We will call you with lab results.       

## 2015-09-21 NOTE — ED Notes (Signed)
Patient states she would prefer to not have repeat CBC and BMP done here - patient had both tests completed earlier today with her PCP.  PA notified.

## 2015-09-21 NOTE — ED Notes (Signed)
Pt declined bp reading for comfort reasons

## 2015-09-21 NOTE — ED Provider Notes (Signed)
CSN: [id number]     Arrival date & time 09/21/15  1654 History   First MD Initiated Contact with Patient 09/21/15 1658     Chief Complaint  Patient presents with  . Chest Pain  . Shortness of Breath  . Abnormal Lab     (Consider location/radiation/quality/duration/timing/severity/associated sxs/prior Treatment) Patient is a 55 y.o. female presenting with chest pain and shortness of breath. The history is provided by the patient and medical records.  Chest Pain Associated symptoms: shortness of breath   Shortness of Breath Associated symptoms: chest pain      55 year old female with history of IBS, asthma, GERD, kidney stents, anxiety, chronic back pain, vitamin D deficiency, presenting to the ED from PCP office for elevated d-dimer.  Patient states for the past 3 weeks she has intermittently had chest pain and shortness of breath. She states it does not seem exacerbated by anything, better with sitting still. She denies any cough, fever, body aches, or other URI type symptoms. She does admit that she recently traveled to [place] and back 3 weeks ago. She also recently had surgery on her right hand from a dog bite. States her hand has been healing well. She initially did have some left calf pain several weeks ago, but this resolved spontaneously. She denies any current chest pain or swelling. She has no history of DVT or PE in the past. States her sister had a DVT in the setting of surgery, no history of clotting disorder. Patient has no cardiac history. She is not a smoker. She does have history of asthma but states her current symptoms seem different than her prior asthma exacerbations. She saw her PCP earlier today and had labs as well as a chest x-ray performed. Chest x-ray was clear, d-dimer was elevated at 0.72.  Thus she was referred to the ED for further evaluation. Patient was initially tachycardic on arrival.  Past Medical History  Diagnosis Date  . IBS (irritable bowel syndrome)   .  Asthma   . GERD (gastroesophageal reflux disease)   . Scoliosis   . Kidney stone 2015    gets every few years, hx/o calcium oxalate stones  . Seasonal allergic rhinitis   . Seasonal allergic conjunctivitis   . Wears glasses   . Anxiety   . Headache     occasional  . Chronic back pain   . Osteopenia   . Vitamin D deficiency    Past Surgical History  Procedure Laterality Date  . Appendectomy    . Esophageal dilation      [place], [place]  . Esophagoplasty  0000000    complication of esophageal dilitation, hospitalization at [facility]  . Colonoscopy  2013    [place], [place]  . Kidney stone surgery  1986  . Lithotripsy      4x prior as of 05/2014  . Incision and drainage abscess Bilateral 09/14/2015    Procedure: INCISION AND DRAINAGE ABSCESS;  Surgeon: Leanora Cover, MD;  Location: [place];  Service: Orthopedics;  Laterality: Bilateral;   Family History  Problem Relation Age of Onset  . Diabetes Father   . Kidney Stones Father   . Diabetes Sister   . Kidney Stones Brother   . Migraines Brother   . Heart disease Maternal Grandfather   . Cancer Neg Hx   . Stroke Neg Hx   . Hypertension Neg Hx    Social History  Substance Use Topics  . Smoking status: Never Smoker   .  Smokeless tobacco: None  . Alcohol Use: 4.2 oz/week    7 Glasses of wine per week   OB History    No data available     Review of Systems  Respiratory: Positive for shortness of breath.   Cardiovascular: Positive for chest pain.  All other systems reviewed and are negative.     Allergies  Review of patient's allergies indicates no known allergies.  Home Medications   Prior to Admission medications   Medication Sig Start Date End Date Taking? Authorizing Provider  acetaminophen (TYLENOL) 500 MG tablet Take 1,000 mg by mouth every 6 (six) hours as needed for mild pain, moderate pain, fever or headache.   Yes Historical Provider, MD  albuterol (PROVENTIL HFA;VENTOLIN HFA) 108 (90  BASE) MCG/ACT inhaler Inhale 2 puffs into the lungs every 6 (six) hours as needed for wheezing or shortness of breath. 02/14/15  Yes Denita Lung, MD  amoxicillin-clavulanate (AUGMENTIN) 875-125 MG tablet Take 1 tablet by mouth 2 (two) times daily. Started 06/23 for 7 days 09/14/15  Yes Historical Provider, MD  estradiol (CLIMARA - DOSED IN MG/24 HR) 0.05 mg/24hr patch Place 1 patch onto the skin every Sunday. 08/31/15  Yes Historical Provider, MD  fluticasone (FLOVENT HFA) 220 MCG/ACT inhaler Inhale 2 puffs into the lungs 2 (two) times daily. Patient taking differently: Inhale 2 puffs into the lungs 2 (two) times daily as needed (for shortness of breath).  03/13/15  Yes Vickie Hedy Camara, NP  LORazepam (ATIVAN) 0.5 MG tablet Take 0.5 mg by mouth every 8 (eight) hours as needed for anxiety.   Yes Historical Provider, MD  progesterone (PROMETRIUM) 100 MG capsule Take 100 mg by mouth daily.   Yes Historical Provider, MD  Tdap (BOOSTRIX) 5-2.5-18.5 LF-MCG/0.5 injection Inject 0.5 mLs into the muscle once.   Yes Historical Provider, MD   BP 118/79 mmHg  Pulse 109  Temp(Src) 97.8 F (36.6 C) (Oral)  Resp 21  Ht 5\' 2"  (1.575 m)  Wt 50.349 kg  BMI 20.30 kg/m2  SpO2 100%   Physical Exam  Constitutional: She is oriented to person, place, and time. She appears well-developed and well-nourished.  HENT:  Head: Normocephalic and atraumatic.  Mouth/Throat: Oropharynx is clear and moist.  Eyes: Conjunctivae and EOM are normal. Pupils are equal, round, and reactive to light.  Neck: Normal range of motion.  Cardiovascular: Normal rate, regular rhythm and normal heart sounds.   NSR, HR 72 during exam  Pulmonary/Chest: Effort normal and breath sounds normal.  Abdominal: Soft. Bowel sounds are normal.  Musculoskeletal: Normal range of motion.  Right hand in ulnar gutter splint No calf asymmetry, tenderness, or palpable cords, no overlying skin changes or warmth to touch, DP pulses intact bilaterally   Neurological: She is alert and oriented to person, place, and time.  Skin: Skin is warm and dry.  Psychiatric: She has a normal mood and affect.  Nursing note and vitals reviewed.   ED Course  Procedures (including critical care time) Labs Review Labs Reviewed  BASIC METABOLIC PANEL - Abnormal; Notable for the following:    BUN 23 (*)    All other components within normal limits  CBC WITH DIFFERENTIAL/PLATELET  I-STAT TROPOININ, ED  I-STAT BETA HCG BLOOD, ED ([facility], [facility], [facility] ONLY)    Imaging Review Dg Chest 2 View  09/21/2015  CLINICAL DATA:  Chest pain.  Shortness of breath . EXAM: CHEST  2 VIEW COMPARISON:  CT report 08/11/2013.  Chest x-ray 06/04/2015. FINDINGS: Mediastinum hilar structures  are normal. Lungs are clear of acute infiltrates. Biapical pleural thickening noted consistent with scarring. Heart size normal. Thoracic spine scoliosis again noted. IMPRESSION: 1. No acute cardiopulmonary disease. 2. Thoracic spine scoliosis. Electronically Signed   By: Marcello Moores  Register   On: 09/21/2015 16:21   Ct Angio Chest Pe W/cm &/or Wo Cm  09/21/2015  CLINICAL DATA:  Chest pain and shortness of breath. Elevated D-dimer. EXAM: CT ANGIOGRAPHY CHEST WITH CONTRAST TECHNIQUE: Multidetector CT imaging of the chest was performed using the standard protocol during bolus administration of intravenous contrast. Multiplanar CT image reconstructions and MIPs were obtained to evaluate the vascular anatomy. CONTRAST:  100 mL Isovue 370 COMPARISON:  08/11/2013 FINDINGS: Cardiovascular: Satisfactory opacification of pulmonary arteries is seen, and there is no evidence of pulmonary embolism. No evidence of thoracic aortic aneurysm or dissection. Mediastinum/Lymph Nodes: No masses or pathologically enlarged lymph nodes identified. Lungs/Pleura: No pulmonary mass, infiltrate, or effusion. Upper abdomen: No acute findings. Musculoskeletal: No chest wall mass or suspicious bone lesions identified. Review of the MIP  images confirms the above findings. IMPRESSION: Negative. No evidence of pulmonary embolism or other active disease. Electronically Signed   By: Earle Gell M.D.   On: 09/21/2015 19:08   I have personally reviewed and evaluated these images and lab results as part of my medical decision-making.   EKG Interpretation   Date/Time:  Friday September 21 2015 16:59:52 EDT Ventricular Rate:  65 PR Interval:    QRS Duration: 84 QT Interval:  381 QTC Calculation: 397 R Axis:   -18 Text Interpretation:  Sinus rhythm Borderline left axis deviation  Confirmed by Hazle Coca [phone]) on 09/21/2015 5:03:03 PM      MDM   Final diagnoses:  Chest pain, unspecified chest pain type  Shortness of breath  Elevated d-dimer   55 year old female here with intermittent chest pain and shortness of breath over the past 3 weeks. Seen by PCP earlier today with an elevated d-dimer of 0.72 and sent here for further evaluation. Patient is afebrile, nontoxic. She is in no acute distress. She was initially tachycardic, heart rate normal by time of my evaluation. She has no evidence of DVT on exam. Labs and chest x-ray from earlier today were reviewed, no acute findings aside from elevated d-dimer. EKG sinus rhythm, no acute ischemia. CT images obtained, no evidence of acute PE.  Findings discussed with patient, she acknowledged understanding. At this time, do not suspect acute ACS, PE, dissection, or other acute cardiac event and feel patient is stable for discharge. She will follow-up with her primary care doctor next week.  Discussed plan with patient, he/she acknowledged understanding and agreed with plan of care.  Return precautions given for new or worsening symptoms.  Larene Pickett, PA-C 09/21/15 2054  Gareth Morgan, MD 09/24/15 1224

## 2015-09-21 NOTE — ED Notes (Signed)
Per PCP-patient had recent surgery and recent air travel-D-Dimer positive-intermitent SOB/CP

## 2015-09-21 NOTE — Progress Notes (Signed)
Subjective:    Patient ID: [name], female    DOB: Jul 29, 1960, 55 y.o.   MRN: [id number]  HPI Chief Complaint  Patient presents with  . SOB-    SOB- 3 week history, SOB- pain from her chest doing to her mid back.    She is here today with complaints of a 3 week history of shortness of breath that she describes as not being able to get a "good deep breath" and a week long history of intermittent sharp pain to center of her chest with sitting in the car or sitting on sofa watching TV. States she cannot reproduce the pain, it comes and goes And does not seem to be associated with any particular movement.  Pain lasts about 30 minutes to an hour. Has 3-4 episodes per day. Notices pressure in chest occasionally.  Reports history of recent airplane travel overseas. Recent trauma to her right arm and surgery. No recent history of being immobilized.  Also reports having left calf pain for 2 days last week. Was not aware of her leg being warm, red or swollen or tender. States pain moved upward in her leg and then went away.  She has asthma and has been using her albuterol inhaler without any relief of symptoms.   Denies fever, chills, headache,dizziness, palpitations, abdominal pain, nausea, vomiting, LE edema.  Denies personal history of blood clot. Sister had a blood clot in her leg a few years ago.  She is not a smoker.  Reviewed allergies, medications, past medical, surgical and social history.    Review of Systems Pertinent positives and negatives in the history of present illness.     Objective:   Physical Exam  Constitutional: She is oriented to person, place, and time. She appears well-developed and well-nourished. No distress.  Neck: Normal range of motion. Neck supple. No thyromegaly present.  Cardiovascular: Normal rate, regular rhythm, normal heart sounds and intact distal pulses.  Exam reveals no gallop and no friction rub.   No murmur heard. Pulmonary/Chest: Effort  normal and breath sounds normal. She has no decreased breath sounds. She has no wheezes. She exhibits no tenderness and no deformity.  Musculoskeletal:  Splint to right wrist  Lymphadenopathy:    She has no cervical adenopathy.  Neurological: She is alert and oriented to person, place, and time. She has normal strength.  Skin: Skin is warm and dry. No rash noted. No cyanosis. No pallor. Nails show no clubbing.  Psychiatric: She has a normal mood and affect. Her speech is normal and behavior is normal. Judgment and thought content normal. Cognition and memory are normal.   BP 116/70 mmHg  Pulse 62  Temp(Src) 98 F (36.7 C)  Resp 16  Wt 111 lb 6.4 oz (50.531 kg)  SpO2 98%   ECG for chest pain and shortness of breath. No known risk factors. Sinus bradycardia. Unremarkable otherwise. No acute abnormalities.     Assessment & Plan:  Shortness of breath - Plan: CBC with Differential/Platelet, Comprehensive metabolic panel, D-dimer, quantitative (not at [facility]), EKG 12-Lead, DG Chest 2 View  Chest pain, unspecified chest pain type - Plan: CBC with Differential/Platelet, Comprehensive metabolic panel, D-dimer, quantitative (not at [facility]), EKG 12-Lead, DG Chest 2 View  Asthma, mild intermittent, uncomplicated  Discussed with patient that symptoms do not appear to be related to asthma. she does have some risk factors for blood clot such as recent travel and trauma with surgery however she is at low risk per Wells criteria.  Cannot entirely rule out blood clot but exam does not reveal anything worrisome. Discussed that we are ordering stat labs including a D-dimer. If D-dimer is negative, plan to send for chest XR to rule out underlying etiology to explain symptoms. Discussed with patient that if D-dimer is positive then she will need further testing such as CT of her chest. Discussed other possible etiologies for symptoms including musculoskeletal. Anxiety does not appear to be playing a role.  Audelia Acton,  [place] also discussed symptoms with patient and in agreement with plan of care.  Chest x-ray does not show any acute cardiopulmonary disease. D-dimer is mildly elevated. Spoke with patient regarding result and gave her the option of waiting until Monday to have CT or to go to the emergency department if she has worsening symptoms. she plans to go to the [facility] emergency department this evening for further evaluation and probably CT scan to rule out PE. Called and spoke with triage nurse regarding patient's soon arrival.

## 2015-09-21 NOTE — ED Notes (Signed)
Pt complaint of central chest tightness, shortness of breath/inability to take deep breath, and abnormal lab. Pt sent per PCP to have CT from positive dimer.

## 2015-10-16 ENCOUNTER — Encounter: Payer: Self-pay | Admitting: Internal Medicine

## 2015-10-16 ENCOUNTER — Encounter: Payer: Self-pay | Admitting: Medical

## 2015-10-16 ENCOUNTER — Ambulatory Visit (INDEPENDENT_AMBULATORY_CARE_PROVIDER_SITE_OTHER): Payer: BLUE CROSS/BLUE SHIELD | Admitting: Medical

## 2015-10-16 VITALS — BP 110/68 | HR 52 | Ht 62.0 in | Wt 111.0 lb

## 2015-10-16 DIAGNOSIS — E559 Vitamin D deficiency, unspecified: Secondary | ICD-10-CM | POA: Diagnosis not present

## 2015-10-16 DIAGNOSIS — M8580 Other specified disorders of bone density and structure, unspecified site: Secondary | ICD-10-CM | POA: Insufficient documentation

## 2015-10-16 DIAGNOSIS — J4530 Mild persistent asthma, uncomplicated: Secondary | ICD-10-CM | POA: Diagnosis not present

## 2015-10-16 DIAGNOSIS — K219 Gastro-esophageal reflux disease without esophagitis: Secondary | ICD-10-CM | POA: Diagnosis not present

## 2015-10-16 DIAGNOSIS — R0602 Shortness of breath: Secondary | ICD-10-CM | POA: Diagnosis not present

## 2015-10-16 DIAGNOSIS — R0600 Dyspnea, unspecified: Secondary | ICD-10-CM | POA: Insufficient documentation

## 2015-10-16 DIAGNOSIS — R51 Headache: Secondary | ICD-10-CM

## 2015-10-16 DIAGNOSIS — J4531 Mild persistent asthma with (acute) exacerbation: Secondary | ICD-10-CM

## 2015-10-16 DIAGNOSIS — M4120 Other idiopathic scoliosis, site unspecified: Secondary | ICD-10-CM

## 2015-10-16 DIAGNOSIS — K589 Irritable bowel syndrome without diarrhea: Secondary | ICD-10-CM | POA: Diagnosis not present

## 2015-10-16 DIAGNOSIS — M549 Dorsalgia, unspecified: Secondary | ICD-10-CM

## 2015-10-16 DIAGNOSIS — R0609 Other forms of dyspnea: Secondary | ICD-10-CM | POA: Insufficient documentation

## 2015-10-16 DIAGNOSIS — G8929 Other chronic pain: Secondary | ICD-10-CM | POA: Insufficient documentation

## 2015-10-16 DIAGNOSIS — Z0000 Encounter for general adult medical examination without abnormal findings: Secondary | ICD-10-CM | POA: Diagnosis not present

## 2015-10-16 DIAGNOSIS — R519 Headache, unspecified: Secondary | ICD-10-CM | POA: Insufficient documentation

## 2015-10-16 LAB — TSH: TSH: 1.48 mIU/L

## 2015-10-16 LAB — POCT URINALYSIS DIPSTICK
Bilirubin, UA: NEGATIVE
GLUCOSE UA: NEGATIVE
KETONES UA: NEGATIVE
Leukocytes, UA: NEGATIVE
Nitrite, UA: NEGATIVE
Protein, UA: NEGATIVE
UROBILINOGEN UA: NEGATIVE
pH, UA: 5.5

## 2015-10-16 MED ORDER — ALBUTEROL SULFATE HFA 108 (90 BASE) MCG/ACT IN AERS: 2.0000 | INHALATION_SPRAY | Freq: Four times a day (QID) | RESPIRATORY_TRACT | 2 refills | Status: DC | PRN

## 2015-10-16 MED ORDER — FLUTICASONE PROPIONATE HFA 220 MCG/ACT IN AERO: 2.0000 | INHALATION_SPRAY | Freq: Two times a day (BID) | RESPIRATORY_TRACT | 11 refills | Status: DC

## 2015-10-16 NOTE — Progress Notes (Signed)
Subjective:   HPI  [name] is a 55 y.o. female who presents for a complete physical.  Medical care team includes:  [place] doctor and dentist  Crisoforo Oxford, PA-C here for primary care  Concerns: Gets winded easily with exercise.   Wasn't this way a year ago.   Has asthma, but feels like major flares spring and fall, but does get some wheezing every few weeks.  She just notes in the past year, any level of exercise fatigues her.  Wants referral to GI here in [place].  Use to see GI in [place], [place] but that doctor went into research.   Has chronic back pian, exercises regularly though  Has f/u today with hand surgeon from dog bite and abscess last month.  No other recent issues.  Reviewed their medical, surgical, family, social, medication, and allergy history and updated chart as appropriate.  Past Medical History:  Diagnosis Date  . Anxiety   . Asthma   . Chronic back pain   . GERD (gastroesophageal reflux disease)   . Headache    occasional  . IBS (irritable bowel syndrome)   . Kidney stone 2015   gets every few years, hx/o calcium oxalate stones  . Osteopenia   . Scoliosis   . Seasonal allergic conjunctivitis   . Seasonal allergic rhinitis   . Vitamin D deficiency   . Wears glasses     Past Surgical History:  Procedure Laterality Date  . APPENDECTOMY    . COLONOSCOPY  2013   [place], [place]  . ESOPHAGEAL DILATION     several times prior, [place], [place]  . ESOPHAGOPLASTY  0000000   complication of esophageal dilitation, hospitalization at [facility] Bilateral 09/14/2015   Procedure: INCISION AND DRAINAGE ABSCESS;  Surgeon: Leanora Cover, MD;  Location: [facility];  Service: Orthopedics;  Laterality: Bilateral;  . [place]  . LITHOTRIPSY     4x prior as of 05/2014    Social History   Social History  . Marital status: Single    Spouse name: N/A  . Number of  children: N/A  . Years of education: N/A   Occupational History  . Not on file.   Social History Main Topics  . Smoking status: Never Smoker  . Smokeless tobacco: Not on file  . Alcohol use 4.2 oz/week    7 Glasses of wine per week  . Drug use: No  . Sexual activity: Not on file   Other Topics Concern  . Not on file   Social History Narrative   2 dogs, muts [place] and [place], exercise 3-4 days per week with walking, Yoga, weights, CPA.  Customer service manager at [employer]    Family History  Problem Relation Age of Onset  . Diabetes Father   . Kidney Stones Father   . Diabetes Sister   . Kidney Stones Brother   . Migraines Brother   . Heart disease Maternal Grandfather   . Cancer Neg Hx   . Stroke Neg Hx   . Hypertension Neg Hx      Current Outpatient Prescriptions:  .  estradiol (CLIMARA - DOSED IN MG/24 HR) 0.05 mg/24hr patch, Place 1 patch onto the skin every Sunday., Disp: , Rfl: 1 .  LORazepam (ATIVAN) 0.5 MG tablet, Take 0.5 mg by mouth every 8 (eight) hours as needed for anxiety., Disp: , Rfl:  .  progesterone ([place])  100 MG capsule, Take 100 mg by mouth daily., Disp: , Rfl:  .  acetaminophen (TYLENOL) 500 MG tablet, Take 1,000 mg by mouth every 6 (six) hours as needed for mild pain, moderate pain, fever or headache., Disp: , Rfl:  .  albuterol (PROVENTIL HFA;VENTOLIN HFA) 108 (90 BASE) MCG/ACT inhaler, Inhale 2 puffs into the lungs every 6 (six) hours as needed for wheezing or shortness of breath. (Patient not taking: Reported on 10/16/2015), Disp: 1 Inhaler, Rfl: 0 .  fluticasone (FLOVENT HFA) 220 MCG/ACT inhaler, Inhale 2 puffs into the lungs 2 (two) times daily. (Patient not taking: Reported on 10/16/2015), Disp: 1 Inhaler, Rfl: 2  No Known Allergies   Review of Systems Constitutional: -fever, -chills, -sweats, -unexpected weight change, -decreased appetite, -fatigue Allergy: -sneezing, -itching, -congestion Dermatology: -changing moles, --rash,  -lumps ENT: -runny nose, -ear pain, -sore throat, -hoarseness, -sinus pain, -teeth pain, - ringing in ears, -hearing loss, -nosebleeds Cardiology: -chest pain, -palpitations, -swelling, -difficulty breathing when lying flat, -waking up short of breath Respiratory: -cough, +shortness of breath, -difficulty breathing with exercise or exertion, -wheezing, -coughing up blood Gastroenterology: +abdominal pain, -nausea, -vomiting, -diarrhea, -constipation, -blood in stool, -changes in bowel movement, -difficulty swallowing or eating Hematology: -bleeding, -bruising  Musculoskeletal: -joint aches, -muscle aches, -joint swelling, +back pain, -neck pain, -cramping, -changes in gait Ophthalmology: denies vision changes, eye redness, itching, discharge Urology: -burning with urination, -difficulty urinating, -blood in urine, -urinary frequency, -urgency, -incontinence Neurology: -headache, -weakness, -tingling, -numbness, -memory loss, -falls, -dizziness Psychology: -depressed mood, -agitation, -sleep problems     Objective:   Physical Exam  BP 110/68   Pulse (!) 52   Ht 5\' 2"  (1.575 m)   Wt 111 lb (50.3 kg)   BMI 20.30 kg/m   Wt Readings from Last 3 Encounters:  10/16/15 111 lb (50.3 kg)  09/21/15 111 lb (50.3 kg)  09/21/15 111 lb 6.4 oz (50.5 kg)   General appearance: alert, no distress, WD/WN, lean white female Skin: scattered macules, no worrisome lesions HEENT: normocephalic, conjunctiva/corneas normal, sclerae anicteric, PERRLA, EOMi, nares patent, no discharge or erythema, pharynx normal Oral cavity: MMM, tongue normal, teeth normal Neck: supple, no lymphadenopathy, no thyromegaly, no masses, normal ROM, no bruits Chest: non tender, normal shape and expansion Heart: RRR, normal S1, S2, no murmurs Lungs: CTA bilaterally, no wheezes, rhonchi, or rales Abdomen: +bs, port surgical scar inferior umbilicus, soft, non tender, non distended, no masses, no hepatomegaly, no splenomegaly, no  bruits Back: non tender, normal ROM, +moderate scoliosis Musculoskeletal: splint on right hand from recent surgery, 2 healing puncture wounds left distal forearm posterior and anterior, otherwise upper extremities non tender, no obvious deformity, normal ROM throughout, lower extremities non tender, no obvious deformity, normal ROM throughout Extremities: no edema, no cyanosis, no clubbing Pulses: 2+ symmetric, upper and lower extremities, normal cap refill Neurological: alert, oriented x 3, CN2-12 intact, strength normal upper extremities and lower extremities, sensation normal throughout, DTRs 2+ throughout, no cerebellar signs, gait normal Psychiatric: normal affect, behavior normal, pleasant  Breast/gyn/rectal - deferred to gyn    Assessment and Plan :    Encounter Diagnoses  Name Primary?  . Routine general medical examination at a health care facility Yes  . Gastroesophageal reflux disease without esophagitis   . IBS (irritable bowel syndrome)   . Vitamin D deficiency   . Asthma, mild persistent, uncomplicated   . Chronic back pain   . Idiopathic scoliosis   . Osteopenia   . Nonintractable episodic headache, unspecified headache type   .  SOB (shortness of breath)     Physical exam - discussed healthy lifestyle, diet, exercise, preventative care, vaccinations, and addressed their concerns.  Handout given. Referral to GI here in [facility] today, reviewed recent CMET, CBC from last month.  reviewed normal lipid panel from 2016 See your eye doctor yearly for routine vision care. See your dentist yearly for routine dental care including hygiene visits twice yearly. See your gynecologist yearly for routine gynecological care. Asthma - PFT abnormal.   Advised she take her Flovent daily, c/t albuterol prn, f/u in 40mo Osteopenia - reviewed 2016 Bone Density scan.   C/t routine exercise, healthy diet, and plan to repeat bone density scan in 1-2 years. Risk factors include weight  less than 27 lb, hx/o fracture as adult, Vit D deficiency, white female, on GERD medication, menopausal. Headaches - f/u prn Follow-up pending labs  Char was seen today for annual exam.  Diagnoses and all orders for this visit:  Routine general medical examination at a health care facility -     POCT urinalysis dipstick -     VITAMIN D 25 Hydroxy (Vit-D Deficiency, Fractures) -     TSH  Gastroesophageal reflux disease without esophagitis -     Ambulatory referral to Gastroenterology  IBS (irritable bowel syndrome) -     Ambulatory referral to Gastroenterology  Vitamin D deficiency -     VITAMIN D 25 Hydroxy (Vit-D Deficiency, Fractures)  Asthma, mild persistent, uncomplicated -     Spirometry with Graph  Chronic back pain  Idiopathic scoliosis  Osteopenia -     TSH  Nonintractable episodic headache, unspecified headache type  SOB (shortness of breath) -     TSH -     Spirometry with Graph  Asthma with exacerbation, mild persistent -     fluticasone (FLOVENT HFA) 220 MCG/ACT inhaler; Inhale 2 puffs into the lungs 2 (two) times daily.  Other orders -     albuterol (PROVENTIL HFA;VENTOLIN HFA) 108 (90 Base) MCG/ACT inhaler; Inhale 2 puffs into the lungs every 6 (six) hours as needed for wheezing or shortness of breath.

## 2015-10-17 LAB — VITAMIN D 25 HYDROXY (VIT D DEFICIENCY, FRACTURES): VIT D 25 HYDROXY: 32 ng/mL (ref 30–100)

## 2015-11-20 ENCOUNTER — Encounter: Payer: Self-pay | Admitting: Medical

## 2015-11-20 ENCOUNTER — Ambulatory Visit (INDEPENDENT_AMBULATORY_CARE_PROVIDER_SITE_OTHER): Payer: BLUE CROSS/BLUE SHIELD | Admitting: Medical

## 2015-11-20 VITALS — BP 110/64 | HR 53 | Wt 110.0 lb

## 2015-11-20 DIAGNOSIS — J309 Allergic rhinitis, unspecified: Secondary | ICD-10-CM

## 2015-11-20 DIAGNOSIS — R070 Pain in throat: Secondary | ICD-10-CM | POA: Diagnosis not present

## 2015-11-20 DIAGNOSIS — Z87442 Personal history of urinary calculi: Secondary | ICD-10-CM | POA: Diagnosis not present

## 2015-11-20 DIAGNOSIS — R3129 Other microscopic hematuria: Secondary | ICD-10-CM | POA: Diagnosis not present

## 2015-11-20 DIAGNOSIS — J4540 Moderate persistent asthma, uncomplicated: Secondary | ICD-10-CM

## 2015-11-20 DIAGNOSIS — K219 Gastro-esophageal reflux disease without esophagitis: Secondary | ICD-10-CM

## 2015-11-20 LAB — POCT URINALYSIS DIPSTICK
Bilirubin, UA: 2
Glucose, UA: NEGATIVE
KETONES UA: NEGATIVE
Leukocytes, UA: NEGATIVE
Nitrite, UA: NEGATIVE
PH UA: 6
PROTEIN UA: NEGATIVE
RBC UA: 1
UROBILINOGEN UA: NEGATIVE

## 2015-11-20 LAB — POCT UA - MICROSCOPIC ONLY: RBC, urine, microscopic: POSITIVE

## 2015-11-20 NOTE — Progress Notes (Signed)
Subjective: Chief Complaint  Patient presents with  . Follow-up    on asthma and allergies. spirometry done at last visit.    Here for f/u on asthma and allergies.  Last visit she complained of begin winded with activity.   Been using athletic trainer 5 years, but just noticed that she wasn't able to do deep breaths and been having decreased exercise tolerance in the last year or two.   wasn't specifically having wheezing, edema, calf pain, SOB, cough.    Last visit had microscopic hematuria - Has hx/o kidney stones, and chronic hematuria on urinalysis.   Last urology visit was 4 years ago.   Has past a few stones since lithotripsy 4 years ago.  Long term nonsmoker.  No concern for UTI.   Allergies: Currently using some Flonase OTC nasal spray for allergies.  Dog sitting currently, has her dogs and 2 other extra dogs in the house currently.    Only new c/o is irritation in throat.  Not sure if esophagus vs windpipe.  Does take Nexium OTC. Sees GI for chronic stomach issues.     Past Medical History:  Diagnosis Date  . Anxiety   . Asthma   . Chronic back pain   . GERD (gastroesophageal reflux disease)   . Headache    occasional  . IBS (irritable bowel syndrome)   . Kidney stone 2015   gets every few years, hx/o calcium oxalate stones  . Osteopenia   . Scoliosis   . Seasonal allergic conjunctivitis   . Seasonal allergic rhinitis   . Vitamin D deficiency   . Wears glasses    Current Outpatient Prescriptions on File Prior to Visit  Medication Sig Dispense Refill  . albuterol (PROVENTIL HFA;VENTOLIN HFA) 108 (90 Base) MCG/ACT inhaler Inhale 2 puffs into the lungs every 6 (six) hours as needed for wheezing or shortness of breath. 1 Inhaler 2  . estradiol (CLIMARA - DOSED IN MG/24 HR) 0.05 mg/24hr patch Place 1 patch onto the skin every Sunday.  1  . fluticasone (FLOVENT HFA) 220 MCG/ACT inhaler Inhale 2 puffs into the lungs 2 (two) times daily. 1 Inhaler 11  . LORazepam (ATIVAN) 0.5  MG tablet Take 0.5 mg by mouth every 8 (eight) hours as needed for anxiety.    . progesterone (PROMETRIUM) 100 MG capsule Take 100 mg by mouth daily.     No current facility-administered medications on file prior to visit.    ROS as in subjective   Objective: BP 110/64   Pulse (!) 53   Wt 110 lb (49.9 kg)   SpO2 94%   BMI 20.12 kg/m   General appearance: alert, no distress, WD/WN HEENT: normocephalic, sclerae anicteric, TMs pearly, nares patent, no discharge or erythema, pharynx normal Oral cavity: MMM, no lesions Neck: supple, no lymphadenopathy, no thyromegaly, no masses Heart: RRR, normal S1, S2, no murmurs Lungs: CTA bilaterally, no wheezes, rhonchi, or rales Ext: no edema Pulses: 2+ symmetric, upper and lower extremities, normal cap refill    Assessment: Encounter Diagnoses  Name Primary?  Marland Kitchen Asthma, moderate persistent, uncomplicated Yes  . Microscopic hematuria   . Allergic rhinitis, unspecified allergic rhinitis type   . Throat discomfort   . History of renal calculi   . Gastroesophageal reflux disease without esophagitis     Plan: Asthma - PFTs improved.  C/t Flovent BID for now, albuterol prn, and call report in 2-3 weeks regarding exercise tolerance. If needed change to Symbicort or similar combo Medication, potentially add  Singulair. Rinse mouth out with water after Flovent use  allergic rhinitis - in light of the recent throat discomfort, begin daily antihistamine for the next 3-4 weeks..  C/t Flonase nasal.  GERD - use samples of Dexilant the next 10 days to see if throat discomfort improves.   microscopic hematuria - UA still shows microscopic hematuria.   Urine Micro shows no RBC.  False +

## 2015-11-26 ENCOUNTER — Emergency Department (HOSPITAL_COMMUNITY)
Admission: EM | Admit: 2015-11-26 | Discharge: 2015-11-26 | Disposition: A | Discharge status: Home / Self Care | Payer: BLUE CROSS/BLUE SHIELD | Attending: Emergency Medicine | Admitting: Emergency Medicine

## 2015-11-26 ENCOUNTER — Encounter (HOSPITAL_COMMUNITY): Payer: Self-pay | Admitting: Emergency Medicine

## 2015-11-26 ENCOUNTER — Emergency Department (HOSPITAL_COMMUNITY): Payer: BLUE CROSS/BLUE SHIELD

## 2015-11-26 DIAGNOSIS — J4540 Moderate persistent asthma, uncomplicated: Secondary | ICD-10-CM | POA: Insufficient documentation

## 2015-11-26 DIAGNOSIS — Z79899 Other long term (current) drug therapy: Secondary | ICD-10-CM | POA: Diagnosis not present

## 2015-11-26 DIAGNOSIS — Y929 Unspecified place or not applicable: Secondary | ICD-10-CM | POA: Diagnosis not present

## 2015-11-26 DIAGNOSIS — Y999 Unspecified external cause status: Secondary | ICD-10-CM | POA: Insufficient documentation

## 2015-11-26 DIAGNOSIS — Y9389 Activity, other specified: Secondary | ICD-10-CM | POA: Insufficient documentation

## 2015-11-26 DIAGNOSIS — S62639B Displaced fracture of distal phalanx of unspecified finger, initial encounter for open fracture: Secondary | ICD-10-CM

## 2015-11-26 DIAGNOSIS — Z7951 Long term (current) use of inhaled steroids: Secondary | ICD-10-CM | POA: Insufficient documentation

## 2015-11-26 DIAGNOSIS — S62661B Nondisplaced fracture of distal phalanx of left index finger, initial encounter for open fracture: Secondary | ICD-10-CM | POA: Insufficient documentation

## 2015-11-26 DIAGNOSIS — W540XXA Bitten by dog, initial encounter: Secondary | ICD-10-CM | POA: Diagnosis not present

## 2015-11-26 DIAGNOSIS — S6992XA Unspecified injury of left wrist, hand and finger(s), initial encounter: Secondary | ICD-10-CM | POA: Diagnosis present

## 2015-11-26 MED ORDER — AMOXICILLIN-POT CLAVULANATE 875-125 MG PO TABS: 1.0000 | ORAL_TABLET | Freq: Two times a day (BID) | ORAL | 0 refills | Status: DC

## 2015-11-26 NOTE — ED Provider Notes (Addendum)
[facility] DEPT Provider Note   CSN: [id number] Arrival date & time: 11/26/15  0845     History   Chief Complaint Chief Complaint  Patient presents with  . Animal Bite    HPI [name] is a 55 y.o. female who presents emergency Department with chief complaint of dog bite. The patient broke up a scuffle between her dog and another dog and was bitten on her left index finger last night. She states that there was an open flap of skin at the distal tip. She states that she washed it thoroughly. Bandaged it and went to bed. She is up-to-date on her tetanus vaccination. This morning the patient woke up and there is that her finger was very bruised and painful. She also wanted make sure that she didn't have a fracture and got the appropriate medications. She was bitten by her dog, her dog is up-to-date on its rabies vaccinations. She denies any pain with flexion or extension of the joints of the finger. No numbness or tingling.  HPI  Past Medical History:  Diagnosis Date  . Anxiety   . Asthma   . Chronic back pain   . GERD (gastroesophageal reflux disease)   . Headache    occasional  . IBS (irritable bowel syndrome)   . Kidney stone 2015   gets every few years, hx/o calcium oxalate stones  . Osteopenia   . Scoliosis   . Seasonal allergic conjunctivitis   . Seasonal allergic rhinitis   . Vitamin D deficiency   . Wears glasses     Patient Active Problem List   Diagnosis Date Noted  . Throat discomfort 11/20/2015  . Rhinitis, allergic 11/20/2015  . Microscopic hematuria 11/20/2015  . Asthma, moderate persistent 11/20/2015  . History of renal calculi 11/20/2015  . Routine general medical examination at a health care facility 10/16/2015  . Gastroesophageal reflux disease without esophagitis 10/16/2015  . IBS (irritable bowel syndrome) 10/16/2015  . Cephalalgia 10/16/2015  . Idiopathic scoliosis 10/16/2015  . Chronic back pain 10/16/2015  . Asthma, mild persistent  10/16/2015  . Vitamin D deficiency 10/16/2015  . Osteopenia 10/16/2015  . SOB (shortness of breath) 10/16/2015    Past Surgical History:  Procedure Laterality Date  . APPENDECTOMY    . COLONOSCOPY  2013   [place], [place]  . ESOPHAGEAL DILATION     several times prior, [place],   . ESOPHAGOPLASTY  0000000   complication of esophageal dilitation, hospitalization at [facility] Bilateral 09/14/2015   Procedure: INCISION AND DRAINAGE ABSCESS;  Surgeon: Leanora Cover, MD;  Location: [facility];  Service: Orthopedics;  Laterality: Bilateral;  . [place]  . LITHOTRIPSY     4x prior as of 05/2014    OB History    No data available       Home Medications    Prior to Admission medications   Medication Sig Start Date End Date Taking? Authorizing Provider  albuterol (PROVENTIL HFA;VENTOLIN HFA) 108 (90 Base) MCG/ACT inhaler Inhale 2 puffs into the lungs every 6 (six) hours as needed for wheezing or shortness of breath. 10/16/15  Yes Camelia Eng Tysinger, PA-C  estradiol (CLIMARA - DOSED IN MG/24 HR) 0.05 mg/24hr patch Place 1 patch onto the skin every Sunday. 08/31/15  Yes Historical Provider, MD  fluticasone (FLOVENT HFA) 220 MCG/ACT inhaler Inhale 2 puffs into the lungs 2 (two) times daily. 10/16/15  Yes Carlena Hurl, PA-C  LORazepam (ATIVAN) 0.5 MG tablet Take 0.5 mg by mouth every 8 (eight) hours as needed for anxiety.   Yes Historical Provider, MD  progesterone (PROMETRIUM) 100 MG capsule Take 100 mg by mouth daily.   Yes Historical Provider, MD  amoxicillin-clavulanate (AUGMENTIN) 875-125 MG tablet Take 1 tablet by mouth 2 (two) times daily. One po bid x 7 days 11/26/15   Margarita Mail, PA-C    Family History Family History  Problem Relation Age of Onset  . Diabetes Father   . Kidney Stones Father   . Diabetes Sister   . Kidney Stones Brother   . Migraines Brother   . Heart disease Maternal Grandfather   . Cancer  Neg Hx   . Stroke Neg Hx   . Hypertension Neg Hx     Social History Social History  Substance Use Topics  . Smoking status: Never Smoker  . Smokeless tobacco: Never Used  . Alcohol use 4.2 oz/week    7 Glasses of wine per week     Allergies   Review of patient's allergies indicates no known allergies.   Review of Systems Review of Systems  Ten systems reviewed and are negative for acute change, except as noted in the HPI.   Physical Exam Updated Vital Signs BP 142/78 (BP Location: Left Arm)   Pulse (!) 55   Temp 97.7 F (36.5 C) (Oral)   SpO2 100%   Physical Exam Physical Exam  Nursing note and vitals reviewed. Constitutional: She is oriented to person, place, and time. She appears well-developed and well-nourished. No distress.  HENT:  Head: Normocephalic and atraumatic.  Eyes: Conjunctivae normal and EOM are normal. Pupils are equal, round, and reactive to light. No scleral icterus.  Neck: Normal range of motion.  Cardiovascular: Normal rate, regular rhythm and normal heart sounds.  Exam reveals no gallop and no friction rub.   No murmur heard. Pulmonary/Chest: Effort normal and breath sounds normal. No respiratory distress.  Abdominal: Soft. Bowel sounds are normal. She exhibits no distension and no mass. There is no tenderness. There is no guarding.  Neurological: She is alert and oriented to person, place, and time.  Musculoskeletal: Left index finger with elliptical avulsion of the distal tip. There is clot formation and the tip is well sealed. The bruising is noted from the DIP distally with bruising under the nail. Tender to palpation. No tenderness along the flexor or extensor tendons. Full range of motion. Skin: Skin is warm and dry. She is not diaphoretic.     ED Treatments / Results  Labs (all labs ordered are listed, but only abnormal results are displayed) Labs Reviewed - No data to display  EKG  EKG Interpretation None       Radiology Dg  Finger Index Left  Result Date: 11/26/2015 CLINICAL DATA:  Dog bite to distal index finger. EXAM: LEFT INDEX FINGER 2+V COMPARISON:  None. FINDINGS: A nondisplaced tuft fracture is identified. No subluxation or dislocation noted. No radiopaque foreign bodies are identified. IMPRESSION: Nondisplaced tuft fracture.  No radiopaque foreign bodies Electronically Signed   By: Margarette Canada M.D.   On: 11/26/2015 09:43    Procedures Procedures (including critical care time)  Medications Ordered in ED Medications - No data to display  SPLINT APPLICATION Date/Time: 99991111 AM Authorized by: Margarita Mail Consent: Verbal consent obtained. Risks and benefits: risks, benefits and alternatives were discussed Consent given by: patient Splint applied by: Nursing staff Location details: Left index  Splint type: static finger Neurovascularly  unchanged following the procedure. Patient tolerance: Patient tolerated the procedure well with no immediate complications.    Initial Impression / Assessment and Plan / ED Course  I have reviewed the triage vital signs and the nursing notes.  Pertinent labs & imaging results that were available during my care of the patient were reviewed by me and considered in my medical decision making (see chart for details).  Clinical Course  Comment By Time  Patient with dog bite to the left index finger, up-to-date on tetanus vaccination. I've ordered a plain film of the finger to rule out open fracture. Margarita Mail, PA-C 09/04 [phone]   Patient with open fracture of the left index finger distal tuft. I have ordered bandaging,and splint. Discharge with Augmentin. She will follow up with Dr. Caralyn Guile.   Final Clinical Impressions(s) / ED Diagnoses   Final diagnoses:  Dog bite  Open fracture of tuft of distal phalanx of finger, initial encounter    New Prescriptions New Prescriptions   AMOXICILLIN-CLAVULANATE (AUGMENTIN) 875-125 MG TABLET    Take 1 tablet by mouth 2  (two) times daily. One po bid x 7 days  Dictation #1 [id number]  [id number]    Margarita Mail, PA-C 11/26/15 1000  Medical screening examination/treatment/procedure(s) were performed by non-physician practitioner and as supervising physician I was immediately available for consultation/collaboration.   EKG Interpretation None         Deno Etienne, DO 12/06/15 [place], DO 12/06/15 [phone]

## 2015-11-26 NOTE — ED Triage Notes (Signed)
Pt c/o L pointer finger pain and puncture wound from dog bite last night. Pt sts her dogs got into a scuffle and she went to break it up and ended up bitten. Pt sts dogs are up to date on rabies vaccinations. Pt able to move finger with pain. Bruising and swelling noted to tip of finger. Bleeding controlled at this time. A&Ox4 and ambulatory.

## 2015-12-17 ENCOUNTER — Telehealth: Payer: Self-pay

## 2015-12-17 ENCOUNTER — Other Ambulatory Visit: Payer: Self-pay | Admitting: Medical

## 2015-12-17 MED ORDER — METHOCARBAMOL 500 MG PO TABS: ORAL_TABLET | ORAL | 0 refills | Status: DC

## 2015-12-17 NOTE — Telephone Encounter (Signed)
Pt called requesting refill Robaxin to [facility].

## 2015-12-24 ENCOUNTER — Encounter: Payer: Self-pay | Admitting: *Deleted

## 2015-12-24 NOTE — Telephone Encounter (Signed)
Pt also needs refill of lorazepam 0.5 mg to [facility] [place].

## 2015-12-24 NOTE — Telephone Encounter (Signed)
pls call pharmacy and see who was filling this and what was the quantity?  I don't think I've ever had to refill this for her yet.

## 2015-12-25 ENCOUNTER — Other Ambulatory Visit: Payer: Self-pay | Admitting: *Deleted

## 2015-12-25 MED ORDER — LORAZEPAM 0.5 MG PO TABS: 0.5000 mg | ORAL_TABLET | Freq: Two times a day (BID) | ORAL | 0 refills | Status: DC | PRN

## 2015-12-25 NOTE — Telephone Encounter (Signed)
pls call out the same, order electronically for #60, no refill.   I think she uses this periodically.  Make sure she is aware this is for prn use, not daily use as I can't certainly refill this quantity regularly such as monthly.  Given her last refill in 2015, she is obviously not overusing it.

## 2015-12-25 NOTE — Telephone Encounter (Signed)
[facility] states that pt was given Lorazapam 0.5mg  1 tab BID prn #60 - October of 2015 by Dr Sharol Roussel

## 2015-12-25 NOTE — Telephone Encounter (Signed)
Rx printed and waiting for signature 

## 2015-12-26 ENCOUNTER — Encounter: Payer: Self-pay | Admitting: Internal Medicine

## 2015-12-26 ENCOUNTER — Ambulatory Visit (INDEPENDENT_AMBULATORY_CARE_PROVIDER_SITE_OTHER): Payer: BLUE CROSS/BLUE SHIELD | Admitting: Internal Medicine

## 2015-12-26 VITALS — BP 108/72 | HR 64 | Ht 62.0 in | Wt 108.5 lb

## 2015-12-26 DIAGNOSIS — K200 Eosinophilic esophagitis: Secondary | ICD-10-CM | POA: Diagnosis not present

## 2015-12-26 DIAGNOSIS — K589 Irritable bowel syndrome without diarrhea: Secondary | ICD-10-CM | POA: Diagnosis not present

## 2015-12-26 DIAGNOSIS — R140 Abdominal distension (gaseous): Secondary | ICD-10-CM | POA: Diagnosis not present

## 2015-12-26 DIAGNOSIS — K219 Gastro-esophageal reflux disease without esophagitis: Secondary | ICD-10-CM

## 2015-12-26 MED ORDER — ESOMEPRAZOLE MAGNESIUM 40 MG PO CPDR: 40.0000 mg | DELAYED_RELEASE_CAPSULE | Freq: Every day | ORAL | 3 refills | Status: DC

## 2015-12-26 NOTE — Progress Notes (Signed)
HPI: [name] is a 55 year old female with a past medical history of eosinophilic esophagitis, GERD, and IBS who is seen in consultation at the request of Dorothea Ogle, PA-C to establish care for her GI issues. She is here alone today. She previously followed in [facility] with [facility].  She reports currently that she is doing well. She states that she has "flares" of atypical chest pain which is associated with some heartburn symptoms and trouble swallowing. She is aware of the diagnosis of EoE however is not currently on therapy. Right now the symptoms are well-controlled and not bothering her. In the past she was treated with swallowed fluticasone but found this hard to take an never very beneficial. She estimates that flares occur 2-3 times per year and during this time she will use Nexium 20 mg twice a day for 2-3 weeks which seems to help her symptoms. She reports having gone through prior allergy testing and even tried an elimination diet but was never really able to find a definitive trigger. Currently she denies dysphagia and odynophagia. No nausea or vomiting. Good appetite.  She does report issues with chronic lower abdominal bloating worse after eating. This is been present for many years. There was a question of possible bacterial overgrowth but she doesn't recall any specific treatment. She does avoid lactose. She has 1 bowel movement per day though can skip a day. She tried Bentyl without much relief and it seemed to make her "loopy". She denies diarrhea. She denies blood in her stool and melena. She has tried probiotics in the past which actually cause her to have loose stools if not diarrhea.  She recalls an upper endoscopy performed in [id number] which was complicated by esophageal tear and possibly a minor perforation. She was transferred to [facility] where she recalls being nothing by mouth for several days. She recalls waking up after EGD with dilation with significant  chest pain.  She also reports prior colonoscopy which she was told was normal.  She denies a family history of colorectal cancer.   She works as a Engineer, maintenance (IT). She is single and has no children. She has never used tobacco and averages about 1 alcoholic drink per day. No illicit drugs.  Past Medical History:  Diagnosis Date  . Acute pancreatitis   . Anxiety   . Asthma   . Chronic back pain   . Eosinophilic esophagitis   . Esophageal spasm   . Esophageal stricture   . GERD (gastroesophageal reflux disease)   . Headache    occasional  . IBS (irritable bowel syndrome)   . Kidney stone 2015   gets every few years, hx/o calcium oxalate stones  . Osteopenia   . Scoliosis   . Seasonal allergic conjunctivitis   . Seasonal allergic rhinitis   . Vitamin D deficiency   . Wears glasses     Past Surgical History:  Procedure Laterality Date  . APPENDECTOMY    . COLONOSCOPY  2013   [place], [place]  . ESOPHAGEAL DILATION     several times prior, [place], [place]  . ESOPHAGOPLASTY  0000000   complication of esophageal dilitation, hospitalization at [place] Bilateral 09/14/2015   Procedure: INCISION AND DRAINAGE ABSCESS;  Surgeon: Leanora Cover, MD;  Location: [facility];  Service: Orthopedics;  Laterality: Bilateral;  . [place]  . LITHOTRIPSY     4x prior as of 05/2014  Outpatient Medications Prior to Visit  Medication Sig Dispense Refill  . albuterol (PROVENTIL HFA;VENTOLIN HFA) 108 (90 Base) MCG/ACT inhaler Inhale 2 puffs into the lungs every 6 (six) hours as needed for wheezing or shortness of breath. 1 Inhaler 2  . estradiol (CLIMARA - DOSED IN MG/24 HR) 0.05 mg/24hr patch Place 1 patch onto the skin every Sunday.  1  . fluticasone (FLOVENT HFA) 220 MCG/ACT inhaler Inhale 2 puffs into the lungs 2 (two) times daily. 1 Inhaler 11  . LORazepam (ATIVAN) 0.5 MG tablet Take 1 tablet (0.5 mg total) by mouth 2 (two) times daily as  needed for anxiety. 60 tablet 0  . methocarbamol (ROBAXIN) 500 MG tablet 1 tablet up to TID prn 30 tablet 0  . progesterone (PROMETRIUM) 100 MG capsule Take 100 mg by mouth daily.    Marland Kitchen amoxicillin-clavulanate (AUGMENTIN) 875-125 MG tablet Take 1 tablet by mouth 2 (two) times daily. One po bid x 7 days 14 tablet 0   No facility-administered medications prior to visit.     No Known Allergies  Family History  Problem Relation Age of Onset  . Diabetes Father   . Kidney Stones Father   . Diabetes Sister   . Kidney Stones Brother   . Migraines Brother   . Heart disease Maternal Grandfather   . Cancer Neg Hx   . Stroke Neg Hx   . Hypertension Neg Hx     Social History  Substance Use Topics  . Smoking status: Never Smoker  . Smokeless tobacco: Never Used  . Alcohol use 4.2 oz/week    7 Glasses of wine per week    ROS: As per history of present illness, otherwise negative  BP 108/72   Pulse 64   Ht 5\' 2"  (1.575 m)   Wt 108 lb 8 oz (49.2 kg)   BMI 19.84 kg/m  Constitutional: Well-developed and well-nourished. No distress. HEENT: Normocephalic and atraumatic. Oropharynx is clear and moist. No oropharyngeal exudate. Conjunctivae are normal.  No scleral icterus. Neck: Neck supple. Trachea midline. Cardiovascular: Normal rate, regular rhythm and intact distal pulses. No M/R/G Pulmonary/chest: Effort normal and breath sounds normal. No wheezing, rales or rhonchi. Abdominal: Soft, nontender, nondistended. Bowel sounds active throughout. There are no masses palpable. No hepatosplenomegaly. Extremities: no clubbing, cyanosis, or edema Lymphadenopathy: No cervical adenopathy noted. Neurological: Alert and oriented to person place and time. Skin: Skin is warm and dry. No rashes noted. Psychiatric: Normal mood and affect. Behavior is normal.  RELEVANT LABS AND IMAGING: CBC    Component Value Date/Time   WBC 7.1 09/21/2015 1839   RBC 4.15 09/21/2015 1839   HGB 13.1 09/21/2015 1839    HCT 37.3 09/21/2015 1839   PLT 368 09/21/2015 1839   MCV 89.9 09/21/2015 1839   MCH 31.6 09/21/2015 1839   MCHC 35.1 09/21/2015 1839   RDW 12.0 09/21/2015 1839   LYMPHSABS 2.4 09/21/2015 1839   MONOABS 0.5 09/21/2015 1839   EOSABS 0.4 09/21/2015 1839   BASOSABS 0.1 09/21/2015 1839    CMP     Component Value Date/Time   NA 140 09/21/2015 1839   K 4.1 09/21/2015 1839   CL 105 09/21/2015 1839   CO2 27 09/21/2015 1839   GLUCOSE 91 09/21/2015 1839   BUN 23 (H) 09/21/2015 1839   CREATININE 0.84 09/21/2015 1839   CREATININE 0.87 09/21/2015 1154   CALCIUM 9.4 09/21/2015 1839   PROT 7.2 09/21/2015 1154   ALBUMIN 4.6 09/21/2015 1154   AST 24 09/21/2015  1154   ALT 30 (H) 09/21/2015 1154   ALKPHOS 91 09/21/2015 1154   BILITOT 0.5 09/21/2015 1154   GFRNONAA >60 09/21/2015 1839   GFRAA >60 09/21/2015 1839    Review of Records: EGD Dr. Rolm Bookbinder -- 08/11/2013 -- Z line 40 cm normal and crisp. Nonocclusive stricture. Whitish small plaques in the mid and distal esophagus which was biopsied. Normal stomach. Normal duodenum. Savory dilator 18 mm with minimal resistance. --Pathology results abundant eosinophilic infiltrates, greater than 100/high power field, and eosinophilic microabscesses consistent with EoE  Colonoscopy 08/24/2008 -- 1 internal hemorrhoid, otherwise normal colonoscopy including 15 cm of terminal ileum. Random biopsies were benign  ASSESSMENT/PLAN:  55 year old female with a past medical history of eosinophilic esophagitis, GERD, and IBS who is seen in consultation at the request of Dorothea Ogle, PA-C to establish care for her GI issues.   1. EoE/GERD -- we discussed her diagnosis of eosinophilic esophagitis at length today. Currently she is asymptomatic. I reviewed records from [facility] which showed mediastinal air by CT after that dilation in 2015. This was treated conservatively to improvement. Currently she denies dysphagia. We discussed how about a  third of EoE patients are responsive to PPI. She does not have frequent heartburn. I'm going to give her prescription for Nexium 40 mg to be used for 2-4 weeks when heartburn or EoE symptoms again. If symptoms fail to improve with PPI alone, I would recommend budesonide slurry. At present without dysphagia, I do not feel she needs repeat EGD.  2. IBS with bloating -- long-standing. Question of possible bacterial overgrowth. I recommended repeat hydrogen breath testing which we will arrange [facility]. If present she would likely respond very well to rifaximin. We've also reviewed and I have recommended the FODMAP diet to see if this helps with her bloating and intestinal gas.  3. Colorectal cancer screening -- normal colonoscopy June 2010, repeat recommended June 2020  Six-month follow-up, sooner if necessary    [id number], [place], [place] [id number]

## 2015-12-26 NOTE — Patient Instructions (Addendum)
We have sent the following medications to your pharmacy for you to pick up at your convenience: Nexium 40 mg daily  We have given you a FODMAP diet to look over and follow.  You have been scheduled for a hydrogen breath test (to check for bacterial overgrowth) at [facility].  Your appointment is scheduled for 01/01/16 at 7:30 am. Please arrive 30 minutes prior to your scheduled appointment for registration purposes (You will register at short stay entrance). Follow the preparation instructions below:  1) Do NOT eat slowly digesting foods like beans, bran or other high fiber cereals the day before your test.  2) You should not have anything to eat or drink 12 hours prior to the test with the exception of water.  3) Do not smoke, sleep, or exercise vigorously for at least 30 minutes before the test or at any time during the test.  4) Plan to be at the hospital for up to 3 hours for this test.  Should you have questions, you may call [phone] [facility]), [phone] [facility]) or [phone].  If you are age 54 or older, your body mass index should be between 23-30. Your Body mass index is 19.84 kg/m. If this is out of the aforementioned range listed, please consider follow up with your Primary Care Provider.  If you are age 54 or younger, your body mass index should be between 19-25. Your Body mass index is 19.84 kg/m. If this is out of the aformentioned range listed, please consider follow up with your Primary Care Provider.

## 2016-01-01 ENCOUNTER — Ambulatory Visit (HOSPITAL_COMMUNITY)
Admission: RE | Admit: 2016-01-01 | Discharge: 2016-01-01 | Disposition: A | Discharge status: Home / Self Care | Payer: BLUE CROSS/BLUE SHIELD | Source: Ambulatory Visit | Attending: Internal Medicine | Admitting: Internal Medicine

## 2016-01-01 ENCOUNTER — Encounter (HOSPITAL_COMMUNITY)
Admission: RE | Disposition: A | Discharge status: Home / Self Care | Payer: Self-pay | Source: Ambulatory Visit | Attending: Internal Medicine

## 2016-01-01 DIAGNOSIS — Z538 Procedure and treatment not carried out for other reasons: Secondary | ICD-10-CM | POA: Insufficient documentation

## 2016-01-01 DIAGNOSIS — R140 Abdominal distension (gaseous): Secondary | ICD-10-CM | POA: Diagnosis present

## 2016-01-01 SURGERY — BREATH TEST, FOR INTESTINAL BACTERIAL OVERGROWTH

## 2016-01-01 MED ORDER — LACTULOSE 10 GM/15ML PO SOLN
ORAL | Status: AC
  Filled 2016-01-01: qty 60

## 2016-01-01 MED ORDER — LACTULOSE 10 GM/15ML PO SOLN: 25.0000 g | Freq: Once | ORAL | Status: DC

## 2016-01-01 NOTE — OR Nursing (Signed)
No beans, bran or high fiber cereal the day before the procedure? NO NPO except for water 12 hours before procedure? YES No smoking, sleeping or vigorous exercising for at least 30 before procedure? NO Recent antibiotic use and/or diarrhea? NO   If yes, physician notified.   Dr. Hilarie Fredrickson notified of patient taking a Metronidizale last night that was prescribed by her [place]. Dr. Hilarie Fredrickson ordered to cancel patient's procedure today and to complete antibiotic that was prescribed. Patient to notify the office to have procedure rescheduled. Patient verbalized understanding.  Time Baseline 15 mins 30 mins 45 mins 60 mins 75 mins 90 mins 105 mins 120 mins 135 mins 150 mins 165 mins 180 mins  H2-ppm

## 2016-01-08 ENCOUNTER — Emergency Department (HOSPITAL_COMMUNITY)
Admission: EM | Admit: 2016-01-08 | Discharge: 2016-01-08 | Disposition: A | Discharge status: Home / Self Care | Payer: BLUE CROSS/BLUE SHIELD | Attending: Emergency Medicine | Admitting: Emergency Medicine

## 2016-01-08 ENCOUNTER — Emergency Department (HOSPITAL_COMMUNITY): Payer: BLUE CROSS/BLUE SHIELD

## 2016-01-08 ENCOUNTER — Encounter (HOSPITAL_COMMUNITY): Payer: Self-pay | Admitting: *Deleted

## 2016-01-08 DIAGNOSIS — J45909 Unspecified asthma, uncomplicated: Secondary | ICD-10-CM | POA: Insufficient documentation

## 2016-01-08 DIAGNOSIS — N201 Calculus of ureter: Secondary | ICD-10-CM

## 2016-01-08 DIAGNOSIS — Z7951 Long term (current) use of inhaled steroids: Secondary | ICD-10-CM | POA: Insufficient documentation

## 2016-01-08 DIAGNOSIS — Z79899 Other long term (current) drug therapy: Secondary | ICD-10-CM | POA: Insufficient documentation

## 2016-01-08 DIAGNOSIS — R109 Unspecified abdominal pain: Secondary | ICD-10-CM | POA: Diagnosis present

## 2016-01-08 LAB — CBC WITH DIFFERENTIAL/PLATELET
BASOS ABS: 0 10*3/uL (ref 0.0–0.1)
Basophils Relative: 0 %
EOS ABS: 0.1 10*3/uL (ref 0.0–0.7)
Eosinophils Relative: 0 %
HCT: 37.1 % (ref 36.0–46.0)
HEMOGLOBIN: 13.1 g/dL (ref 12.0–15.0)
LYMPHS ABS: 1.3 10*3/uL (ref 0.7–4.0)
Lymphocytes Relative: 7 %
MCH: 31.6 pg (ref 26.0–34.0)
MCHC: 35.3 g/dL (ref 30.0–36.0)
MCV: 89.6 fL (ref 78.0–100.0)
Monocytes Absolute: 0.4 10*3/uL (ref 0.1–1.0)
Monocytes Relative: 2 %
NEUTROS PCT: 91 %
Neutro Abs: 17 10*3/uL — ABNORMAL HIGH (ref 1.7–7.7)
Platelets: 394 10*3/uL (ref 150–400)
RBC: 4.14 MIL/uL (ref 3.87–5.11)
RDW: 11.8 % (ref 11.5–15.5)
WBC: 18.7 10*3/uL — AB (ref 4.0–10.5)

## 2016-01-08 LAB — URINE MICROSCOPIC-ADD ON

## 2016-01-08 LAB — URINALYSIS, ROUTINE W REFLEX MICROSCOPIC
BILIRUBIN URINE: NEGATIVE
Glucose, UA: NEGATIVE mg/dL
Ketones, ur: 15 mg/dL — AB
Leukocytes, UA: NEGATIVE
Nitrite: NEGATIVE
PROTEIN: 30 mg/dL — AB
Specific Gravity, Urine: 1.026 (ref 1.005–1.030)
pH: 6 (ref 5.0–8.0)

## 2016-01-08 LAB — COMPREHENSIVE METABOLIC PANEL
ALT: 24 U/L (ref 14–54)
AST: 26 U/L (ref 15–41)
Albumin: 4.8 g/dL (ref 3.5–5.0)
Alkaline Phosphatase: 85 U/L (ref 38–126)
Anion gap: 9 (ref 5–15)
BUN: 22 mg/dL — ABNORMAL HIGH (ref 6–20)
CHLORIDE: 105 mmol/L (ref 101–111)
CO2: 26 mmol/L (ref 22–32)
CREATININE: 0.92 mg/dL (ref 0.44–1.00)
Calcium: 9.6 mg/dL (ref 8.9–10.3)
Glucose, Bld: 102 mg/dL — ABNORMAL HIGH (ref 65–99)
POTASSIUM: 3.6 mmol/L (ref 3.5–5.1)
Sodium: 140 mmol/L (ref 135–145)
TOTAL PROTEIN: 8.2 g/dL — AB (ref 6.5–8.1)
Total Bilirubin: 0.9 mg/dL (ref 0.3–1.2)

## 2016-01-08 MED ORDER — TAMSULOSIN HCL 0.4 MG PO CAPS: 0.4000 mg | ORAL_CAPSULE | Freq: Every day | ORAL | 0 refills | Status: DC

## 2016-01-08 MED ORDER — OXYCODONE-ACETAMINOPHEN 5-325 MG PO TABS
1.0000 | ORAL_TABLET | ORAL | Status: DC | PRN
  Administered 2016-01-08: 1 via ORAL
  Filled 2016-01-08: qty 1

## 2016-01-08 MED ORDER — OXYCODONE-ACETAMINOPHEN 5-325 MG PO TABS: 1.0000 | ORAL_TABLET | ORAL | 0 refills | Status: DC | PRN

## 2016-01-08 NOTE — ED Triage Notes (Signed)
Patient stated around 15:30 today she began having severe right flank pain.  Patient states pain has now eased off, but she continues to have occasional "shooting pain."  Patient has hx of renal stones.  Patient denies hematuria and dysuria.  Patient endorses vomiting x2 and denies fever.

## 2016-01-08 NOTE — ED Notes (Signed)
RN at bedside starting IV and drawing labs

## 2016-01-08 NOTE — Discharge Instructions (Signed)
Plenty of fluids Take pain medicine and Flomax Return if symptoms are worsening

## 2016-01-08 NOTE — ED Provider Notes (Signed)
[facility] DEPT Provider Note   CSN: [id number] Arrival date & time: 01/08/16  1655     History   Chief Complaint Chief Complaint  Patient presents with  . Flank Pain    HPI [name] is a 55 y.o. female who presents with R flank pain. PMH significant for recurrent kidney stones. Some stones have required lithotripsy but most pass on their own per patient. She states the pain started acutely this afternoon. It is over the R flank and radiates to R side of abdomen. She reports associated N/V. She received a Percocet in triage which has improved her pain. She denies fever, chills, constipation/diarrhea, dysuria, hematuria, frequency, vaginal symptoms. Does not have urologist in [place]. Last lithotripsy was ~4 years ago.  HPI  Past Medical History:  Diagnosis Date  . Acute pancreatitis   . Anxiety   . Asthma   . Chronic back pain   . Eosinophilic esophagitis   . Esophageal spasm   . Esophageal stricture   . GERD (gastroesophageal reflux disease)   . Headache    occasional  . IBS (irritable bowel syndrome)   . Kidney stone 2015   gets every few years, hx/o calcium oxalate stones  . Osteopenia   . Scoliosis   . Seasonal allergic conjunctivitis   . Seasonal allergic rhinitis   . Vitamin D deficiency   . Wears glasses     Patient Active Problem List   Diagnosis Date Noted  . Throat discomfort 11/20/2015  . Rhinitis, allergic 11/20/2015  . Microscopic hematuria 11/20/2015  . Asthma, moderate persistent 11/20/2015  . History of renal calculi 11/20/2015  . Routine general medical examination at a health care facility 10/16/2015  . Gastroesophageal reflux disease without esophagitis 10/16/2015  . IBS (irritable bowel syndrome) 10/16/2015  . Cephalalgia 10/16/2015  . Idiopathic scoliosis 10/16/2015  . Chronic back pain 10/16/2015  . Asthma, mild persistent 10/16/2015  . Vitamin D deficiency 10/16/2015  . Osteopenia 10/16/2015  . SOB (shortness of breath)  10/16/2015    Past Surgical History:  Procedure Laterality Date  . APPENDECTOMY    . COLONOSCOPY  2013   [place], [place]  . ESOPHAGEAL DILATION     several times prior, [place], [place]  . ESOPHAGOPLASTY  0000000   complication of esophageal dilitation, hospitalization at [place] Bilateral 09/14/2015   Procedure: INCISION AND DRAINAGE ABSCESS;  Surgeon: Leanora Cover, MD;  Location: [facility];  Service: Orthopedics;  Laterality: Bilateral;  . [place]  . LITHOTRIPSY     4x prior as of 05/2014    OB History    No data available       Home Medications    Prior to Admission medications   Medication Sig Start Date End Date Taking? Authorizing Provider  albuterol (PROVENTIL HFA;VENTOLIN HFA) 108 (90 Base) MCG/ACT inhaler Inhale 2 puffs into the lungs every 6 (six) hours as needed for wheezing or shortness of breath. 10/16/15  Yes Camelia Eng Tysinger, PA-C  esomeprazole (NEXIUM) 40 MG capsule Take 1 capsule (40 mg total) by mouth daily at 12 noon. 12/26/15  Yes Jerene Bears, MD  methocarbamol (ROBAXIN) 500 MG tablet 1 tablet up to TID prn Patient taking differently: Take 500 mg by mouth every 6 (six) hours as needed for muscle spasms.  12/17/15  Yes Camelia Eng Tysinger, PA-C  progesterone (PROMETRIUM) 100 MG capsule Take 100 mg by mouth daily.   Yes Historical Provider,  MD  estradiol (CLIMARA - DOSED IN MG/24 HR) 0.05 mg/24hr patch Place 1 patch onto the skin every Sunday. 08/31/15   Historical Provider, MD  fluticasone (FLOVENT HFA) 220 MCG/ACT inhaler Inhale 2 puffs into the lungs 2 (two) times daily. Patient taking differently: Inhale 2 puffs into the lungs 2 (two) times daily as needed (sob).  10/16/15   Camelia Eng Tysinger, PA-C  LORazepam (ATIVAN) 0.5 MG tablet Take 1 tablet (0.5 mg total) by mouth 2 (two) times daily as needed for anxiety. 12/25/15   Camelia Eng Tysinger, PA-C  oxyCODONE-acetaminophen (PERCOCET/ROXICET) 5-325 MG tablet  Take 1 tablet by mouth every 4 (four) hours as needed for severe pain. 01/08/16   Recardo Evangelist, PA-C  tamsulosin (FLOMAX) 0.4 MG CAPS capsule Take 1 capsule (0.4 mg total) by mouth daily. 01/08/16   Recardo Evangelist, PA-C    Family History Family History  Problem Relation Age of Onset  . Diabetes Father   . Kidney Stones Father   . Diabetes Sister   . Kidney Stones Brother   . Migraines Brother   . Heart disease Maternal Grandfather   . Cancer Neg Hx   . Stroke Neg Hx   . Hypertension Neg Hx     Social History Social History  Substance Use Topics  . Smoking status: Never Smoker  . Smokeless tobacco: Never Used  . Alcohol use 4.2 oz/week    7 Glasses of wine per week     Allergies   Review of patient's allergies indicates no known allergies.   Review of Systems Review of Systems  Constitutional: Negative for chills and fever.  Gastrointestinal: Positive for abdominal pain, nausea and vomiting. Negative for constipation and diarrhea.  Genitourinary: Positive for flank pain. Negative for dysuria, hematuria, vaginal bleeding and vaginal discharge.  All other systems reviewed and are negative.    Physical Exam Updated Vital Signs BP 112/70   Pulse 70   Temp 97.9 F (36.6 C) (Oral)   Resp 16   Ht 5\' 2"  (1.575 m)   Wt 49 kg   SpO2 99%   BMI 19.75 kg/m   Physical Exam  Constitutional: She is oriented to person, place, and time. She appears well-developed and well-nourished. No distress.  Appears comfortable  HENT:  Head: Normocephalic and atraumatic.  Eyes: Conjunctivae are normal. Pupils are equal, round, and reactive to light. Right eye exhibits no discharge. Left eye exhibits no discharge. No scleral icterus.  Neck: Normal range of motion. Neck supple.  Cardiovascular: Normal rate and regular rhythm.  Exam reveals no gallop and no friction rub.   No murmur heard. Pulmonary/Chest: Effort normal and breath sounds normal. No respiratory distress. She has  no wheezes. She has no rales. She exhibits no tenderness.  Abdominal: Soft. Bowel sounds are normal. She exhibits no distension and no mass. There is tenderness. There is no rebound and no guarding. No hernia.  R CVA tenderness   Musculoskeletal: She exhibits no edema.  Neurological: She is alert and oriented to person, place, and time.  Skin: Skin is warm and dry.  Psychiatric: She has a normal mood and affect. Her behavior is normal.  Nursing note and vitals reviewed.    ED Treatments / Results  Labs (all labs ordered are listed, but only abnormal results are displayed) Labs Reviewed  URINALYSIS, ROUTINE W REFLEX MICROSCOPIC (NOT AT [facility]) - Abnormal; Notable for the following:       Result Value   Color, Urine AMBER (*)  APPearance TURBID (*)    Hgb urine dipstick LARGE (*)    Ketones, ur 15 (*)    Protein, ur 30 (*)    All other components within normal limits  URINE MICROSCOPIC-ADD ON - Abnormal; Notable for the following:    Squamous Epithelial / LPF 0-5 (*)    Bacteria, UA FEW (*)    All other components within normal limits  COMPREHENSIVE METABOLIC PANEL - Abnormal; Notable for the following:    Glucose, Bld 102 (*)    BUN 22 (*)    Total Protein 8.2 (*)    All other components within normal limits  CBC WITH DIFFERENTIAL/PLATELET - Abnormal; Notable for the following:    WBC 18.7 (*)    Neutro Abs 17.0 (*)    All other components within normal limits  URINE CULTURE    EKG  EKG Interpretation None       Radiology Ct Renal Stone Study  Result Date: 01/08/2016 CLINICAL DATA:  Acute onset of severe right flank pain. Vomiting. Initial encounter. EXAM: CT ABDOMEN AND PELVIS WITHOUT CONTRAST TECHNIQUE: Multidetector CT imaging of the abdomen and pelvis was performed following the standard protocol without IV contrast. COMPARISON:  MRI of the lumbar spine performed 07/14/2014 FINDINGS: Lower chest: Minimal bibasilar atelectasis is noted. The lung bases are otherwise  clear. The visualized portions of the mediastinum are unremarkable. Hepatobiliary: The liver is unremarkable in appearance. The gallbladder is unremarkable in appearance. The common bile duct remains normal in caliber. Pancreas: The pancreas is within normal limits. Spleen: The spleen is unremarkable in appearance. Adrenals/Urinary Tract: The adrenal glands are unremarkable in appearance. There is an obstructing 5 x 4 mm stone at the proximal right ureter, 3 cm below the right renal pelvis, with minimal right-sided hydronephrosis. Small bilateral nonobstructing renal stones are seen, measuring up to 4 mm in size. No perinephric stranding is appreciated. Stomach/Bowel: The stomach is unremarkable in appearance. The small bowel is within normal limits. The patient is status post appendectomy. The colon is unremarkable in appearance. Vascular/Lymphatic: The abdominal aorta is unremarkable in appearance. The inferior vena cava is grossly unremarkable. No retroperitoneal lymphadenopathy is seen. No pelvic sidewall lymphadenopathy is identified. Reproductive: The bladder is mildly distended and within normal limits. The uterus is grossly unremarkable in appearance. The ovaries are relatively symmetric. No suspicious adnexal masses are seen. Other: No additional soft tissue abnormalities are seen. Musculoskeletal: No acute osseous abnormalities are identified. The visualized musculature is unremarkable in appearance. IMPRESSION: 1. Obstructing 5 x 4 mm stone at the proximal right ureter, 3 cm below the right renal pelvis, with minimal right-sided hydronephrosis. 2. Nonobstructing small bilateral renal stones, measuring up to 4 mm in size. Electronically Signed   By: Garald Balding M.D.   On: 01/08/2016 20:28    Procedures Procedures (including critical care time)  Medications Ordered in ED Medications  oxyCODONE-acetaminophen (PERCOCET/ROXICET) 5-325 MG per tablet 1 tablet (1 tablet Oral Given 01/08/16 1802)      Initial Impression / Assessment and Plan / ED Course  I have reviewed the triage vital signs and the nursing notes.  Pertinent labs & imaging results that were available during my care of the patient were reviewed by me and considered in my medical decision making (see chart for details).  Clinical Course   55 year old female presents with R obstructing stone in the proximal ureter. Patient is afebrile, not tachycardic or tachypneic, normotensive, and not hypoxic. She appears comfortable and pain controlled on exam.  CBC remarkable for leukocytosis of 18.7. CMP remarkable for BUN 22 with normal SCr. UA has few bacteria, large hgb, 15 ketones, 30 protein, with too numerous to count RBC. Culture sent due to bacteruria however patient is asymptomatic. Will d/c with pain meds, flomax, and urology follow up. Patient is NAD, non-toxic, with stable VS. Patient is informed of clinical course, understands medical decision making process, and agrees with plan. Opportunity for questions provided and all questions answered. Return precautions given.   Final Clinical Impressions(s) / ED Diagnoses   Final diagnoses:  Calculus of ureter    New Prescriptions Discharge Medication List as of 01/08/2016  9:07 PM    START taking these medications   Details  oxyCODONE-acetaminophen (PERCOCET/ROXICET) 5-325 MG tablet Take 1 tablet by mouth every 4 (four) hours as needed for severe pain., Starting Tue 01/08/2016, Print    tamsulosin (FLOMAX) 0.4 MG CAPS capsule Take 1 capsule (0.4 mg total) by mouth daily., Starting Tue 01/08/2016, Print         Recardo Evangelist, PA-C 01/08/16 2136    Drenda Freeze, MD 01/09/16 [phone]

## 2016-01-10 LAB — URINE CULTURE

## 2016-02-07 ENCOUNTER — Ambulatory Visit (INDEPENDENT_AMBULATORY_CARE_PROVIDER_SITE_OTHER): Payer: BLUE CROSS/BLUE SHIELD | Admitting: Medical

## 2016-02-07 ENCOUNTER — Encounter: Payer: Self-pay | Admitting: Medical

## 2016-02-07 VITALS — BP 108/60 | HR 65 | Temp 98.5°F | Wt 105.0 lb

## 2016-02-07 DIAGNOSIS — G8929 Other chronic pain: Secondary | ICD-10-CM | POA: Diagnosis not present

## 2016-02-07 DIAGNOSIS — M5410 Radiculopathy, site unspecified: Secondary | ICD-10-CM

## 2016-02-07 DIAGNOSIS — M4125 Other idiopathic scoliosis, thoracolumbar region: Secondary | ICD-10-CM | POA: Diagnosis not present

## 2016-02-07 DIAGNOSIS — M5441 Lumbago with sciatica, right side: Secondary | ICD-10-CM

## 2016-02-07 DIAGNOSIS — M8580 Other specified disorders of bone density and structure, unspecified site: Secondary | ICD-10-CM

## 2016-02-07 MED ORDER — METHOCARBAMOL 500 MG PO TABS: 500.0000 mg | ORAL_TABLET | Freq: Three times a day (TID) | ORAL | 1 refills | Status: DC | PRN

## 2016-02-07 MED ORDER — DICLOFENAC SODIUM 75 MG PO TBEC: 75.0000 mg | DELAYED_RELEASE_TABLET | Freq: Two times a day (BID) | ORAL | 0 refills | Status: DC

## 2016-02-07 NOTE — Progress Notes (Signed)
Subjective: Chief Complaint  Patient presents with  . lower back pain    lower back pain going in her legs   Here for some back pain, been having several month hx/o low back pain radiating into leg.  Not improving, getting worse.  Pain is all on the right.  1 days had tingling in the rihgt posterior leg, but most of the time has pain that radiates into the right leg.   No trauma or injury to the back.   Had dog bite injury few months ago but didn't hurt back or leg then.   She has chronic abdominal pains, but this doesn't seem to be related.  Sees GI.   No urinary concerns.   No recent fevers, no recent weight loss.  Not exercising much because she hurts all the time.  Some mornings limps due to the pain in around SI joint and calve.  She has a hx/o chronic back pain, scoliosis,renal stones, osteopenia, and Vit D deficiency.  She went to PT and ended up seeing spine specialist last year about a year ago.    Currently using naproxen some.     Past Medical History:  Diagnosis Date  . Acute pancreatitis   . Anxiety   . Asthma   . Chronic back pain   . Eosinophilic esophagitis   . Esophageal spasm   . Esophageal stricture   . GERD (gastroesophageal reflux disease)   . Headache    occasional  . IBS (irritable bowel syndrome)   . Kidney stone 2015   gets every few years, hx/o calcium oxalate stones  . Osteopenia   . Scoliosis   . Seasonal allergic conjunctivitis   . Seasonal allergic rhinitis   . Vitamin D deficiency   . Wears glasses    Current Outpatient Prescriptions on File Prior to Visit  Medication Sig Dispense Refill  . albuterol (PROVENTIL HFA;VENTOLIN HFA) 108 (90 Base) MCG/ACT inhaler Inhale 2 puffs into the lungs every 6 (six) hours as needed for wheezing or shortness of breath. 1 Inhaler 2  . esomeprazole (NEXIUM) 40 MG capsule Take 1 capsule (40 mg total) by mouth daily at 12 noon. 30 capsule 3  . estradiol (CLIMARA - DOSED IN MG/24 HR) 0.05 mg/24hr patch Place 1 patch  onto the skin every Sunday.  1  . fluticasone (FLOVENT HFA) 220 MCG/ACT inhaler Inhale 2 puffs into the lungs 2 (two) times daily. (Patient taking differently: Inhale 2 puffs into the lungs 2 (two) times daily as needed (sob). ) 1 Inhaler 11  . LORazepam (ATIVAN) 0.5 MG tablet Take 1 tablet (0.5 mg total) by mouth 2 (two) times daily as needed for anxiety. 60 tablet 0   No current facility-administered medications on file prior to visit.    ROS as in subjective   Objective: BP 108/60   Pulse 65   Temp 98.5 F (36.9 C)   Wt 105 lb (47.6 kg)   SpO2 95%   BMI 19.20 kg/m   General appearance: alert, no distress, WD/WN, lean white female Neck: supple, no lymphadenopathy, no thyromegaly, no masses Abdomen: +bs, soft, non tender, non distended, no masses, no hepatomegaly, no splenomegaly Back: tender right LS region, right sciatic notch and buttock, obvious scoliosis on exam, right back raised compared to left, otherwise non tender Musculoskeletal: legs non tender, no swelling, no obvious deformity Extremities: no edema, no cyanosis, no clubbing Pulses: 2+ symmetric, upper and lower extremities, normal cap refill Neurological: normal DTRs, sensation, strength bilat LE.   +  mild SLR right at 20 degrees   Assessment: Encounter Diagnoses  Name Primary?  . Chronic right-sided low back pain with right-sided sciatica Yes  . Other idiopathic scoliosis, thoracolumbar region   . Osteopenia, unspecified location   . Radicular pain of right lower extremity     Plan: reviewed 07/2014 visit with [facility], 06/2014 L spine MRI, and 2015 xray of lumbar spine.   She has known protruding disc and spondylosis in lumbar region that seems to be aggravating her currently.  Medications below, consider Gabapentin vs referral to ortho for possible injection.   She was not satisfied with the care and [facility] so would rather not go back there.  F/u in 2-3 wk.  Fay was seen today  for lower back pain.  Diagnoses and all orders for this visit:  Chronic right-sided low back pain with right-sided sciatica  Other idiopathic scoliosis, thoracolumbar region  Osteopenia, unspecified location  Radicular pain of right lower extremity  Other orders -     methocarbamol (ROBAXIN) 500 MG tablet; Take 1 tablet (500 mg total) by mouth every 8 (eight) hours as needed for muscle spasms. -     diclofenac (VOLTAREN) 75 MG EC tablet; Take 1 tablet (75 mg total) by mouth 2 (two) times daily.

## 2016-03-06 ENCOUNTER — Ambulatory Visit (INDEPENDENT_AMBULATORY_CARE_PROVIDER_SITE_OTHER): Payer: BLUE CROSS/BLUE SHIELD | Admitting: Physician Assistant

## 2016-03-06 ENCOUNTER — Encounter: Payer: Self-pay | Admitting: Physician Assistant

## 2016-03-06 ENCOUNTER — Encounter (HOSPITAL_COMMUNITY): Payer: Self-pay

## 2016-03-06 ENCOUNTER — Ambulatory Visit (HOSPITAL_COMMUNITY): Admit: 2016-03-06 | Payer: BLUE CROSS/BLUE SHIELD

## 2016-03-06 VITALS — BP 106/58 | HR 70 | Ht 62.0 in | Wt 109.0 lb

## 2016-03-06 DIAGNOSIS — R110 Nausea: Secondary | ICD-10-CM

## 2016-03-06 DIAGNOSIS — R1013 Epigastric pain: Secondary | ICD-10-CM | POA: Diagnosis not present

## 2016-03-06 DIAGNOSIS — K589 Irritable bowel syndrome without diarrhea: Secondary | ICD-10-CM

## 2016-03-06 DIAGNOSIS — R140 Abdominal distension (gaseous): Secondary | ICD-10-CM

## 2016-03-06 DIAGNOSIS — K5909 Other constipation: Secondary | ICD-10-CM

## 2016-03-06 SURGERY — BREATH TEST, FOR INTESTINAL BACTERIAL OVERGROWTH

## 2016-03-06 MED ORDER — LACTULOSE 10 GM/15ML PO SOLN: 25.0000 g | Freq: Once | ORAL | Status: DC

## 2016-03-06 NOTE — Patient Instructions (Addendum)
I will call you to let you let you know how we will proceed with the breath test.  Fiber 25-35 g per day Exercise 30 minutes per day Water - 6-8 glasses a day  Use your Nexium daily for two weeks  Miralax q d if there are no results from information above

## 2016-03-06 NOTE — Progress Notes (Addendum)
Chief Complaint: Epigastric Pain and Bloating  HPI:  [name] is a 55 year old Caucasian female with a past medical history of acute pancreatitis, anxiety, chronic back pain, eosinophilic esophagitis, esophageal stricture, GERD, IBS who returns to clinic today with a complaint of epigastric discomfort and bloating. She is generally followed by Dr. Hilarie Cooke and was last seen in clinic on 12/26/2015.   Per review of last clinic note patient was establishing care and had a history of eosinophilic esophagitis. Apparently she describes "flares" of atypical chest pain associated with heartburn and trouble swallowing. At that time she was doing well but had been treated in the past with swallowed fluticasone but found this not very beneficial. She told him that during times of flare should use Nexium 20 mg twice a day for 2-3 weeks which seemed to help her symptoms. Patient also describes issues with chronic lower abdominal bloating worse after eating whichhad  been present for many years. She had tried Bentyl in the past without much relief and it seemed to make her "loopy". Patient reported last EGD performed in [id number] which was complicated by an esophageal tear and possibly a minor perforation. At time of that visit she was prescribed Nexium 40 mg to be used for 2-4 weeks when patient had symptoms. It was discussed that if symptoms did not improve with PPI alone recommended budesonide slurry. IBS with bloating was discussed and there was question of possible bacterial overgrowth. It was recommended she have a repeat Hydrogen breath test which was arranged at [place], the patient presents to clinic and tells me that she was not able to get the hydrogen breath tests completed as she was started on antibiotics by a different physician. She describes today that for the past couple of weeks she has been experiencing an epigastric pain which seems to radiate over to her left upper quadrant. This  is associated with a large amount of bloating and at times it feels as though she "can't get her breath". She tells me she has also noticed occasional nausea. She denies any heartburn, reflux or dysphagia. The patient denies trying Nexium for this as it was a "different pain than before".   Patient also describes constipation today telling me that over the past few months she has been experiencing bowel movements only every second or third day and tells me that when she does have a bowel movement it is typically small bits throughout the day and she never feels completely empty. When she is constipated this makes her bloating and epigastric pain worse. She has not tried anything to make this better.   Patient denies fever, chills, blood in her stool, melena, change in diet, change in medications, weight loss, fatigue, anorexia, vomiting, heartburn, reflux or symptoms that awaken her at night.  Past Medical History:  Diagnosis Date  . Acute pancreatitis   . Anxiety   . Asthma   . Chronic back pain   . Eosinophilic esophagitis   . Esophageal spasm   . Esophageal stricture   . GERD (gastroesophageal reflux disease)   . Headache    occasional  . IBS (irritable bowel syndrome)   . Kidney stone 2015   gets every few years, hx/o calcium oxalate stones  . Osteopenia   . Scoliosis   . Seasonal allergic conjunctivitis   . Seasonal allergic rhinitis   . Vitamin D deficiency   . Wears glasses     Past Surgical History:  Procedure Laterality Date  .  APPENDECTOMY    . COLONOSCOPY  2013   [place], [place]  . ESOPHAGEAL DILATION     several times prior, [place], [place]  . ESOPHAGOPLASTY  0000000   complication of esophageal dilitation, hospitalization at [facility] Bilateral 09/14/2015   Procedure: INCISION AND DRAINAGE ABSCESS;  Surgeon: Rebecca Cover, MD;  Location: [facility];  Service: Orthopedics;  Laterality: Bilateral;  . [place]  . LITHOTRIPSY     4x prior as of 05/2014    Current Outpatient Prescriptions  Medication Sig Dispense Refill  . albuterol (PROVENTIL HFA;VENTOLIN HFA) 108 (90 Base) MCG/ACT inhaler Inhale 2 puffs into the lungs every 6 (six) hours as needed for wheezing or shortness of breath. 1 Inhaler 2  . diclofenac (VOLTAREN) 75 MG EC tablet Take 1 tablet (75 mg total) by mouth 2 (two) times daily. 45 tablet 0  . esomeprazole (NEXIUM) 40 MG capsule Take 1 capsule (40 mg total) by mouth daily at 12 noon. 30 capsule 3  . estradiol (CLIMARA - DOSED IN MG/24 HR) 0.05 mg/24hr patch Place 1 patch onto the skin every Sunday.  1  . fluticasone (FLOVENT HFA) 220 MCG/ACT inhaler Inhale 2 puffs into the lungs 2 (two) times daily. (Patient taking differently: Inhale 2 puffs into the lungs 2 (two) times daily as needed (sob). ) 1 Inhaler 11  . LORazepam (ATIVAN) 0.5 MG tablet Take 1 tablet (0.5 mg total) by mouth 2 (two) times daily as needed for anxiety. 60 tablet 0  . methocarbamol (ROBAXIN) 500 MG tablet Take 1 tablet (500 mg total) by mouth every 8 (eight) hours as needed for muscle spasms. 30 tablet 1   No current facility-administered medications for this visit.     Allergies as of 03/06/2016  . (No Known Allergies)    Family History  Problem Relation Age of Onset  . Diabetes Father   . Kidney Stones Father   . Diabetes Sister   . Kidney Stones Brother   . Migraines Brother   . Heart disease Maternal Grandfather   . Cancer Neg Hx   . Stroke Neg Hx   . Hypertension Neg Hx     Social History   Social History  . Marital status: Single    Spouse name: N/A  . Number of children: N/A  . Years of education: N/A   Occupational History  . Not on file.   Social History Main Topics  . Smoking status: Never Smoker  . Smokeless tobacco: Never Used  . Alcohol use 4.2 oz/week    7 Glasses of wine per week  . Drug use: No  . Sexual activity: Not on file   Other Topics Concern  . Not on file     Social History Narrative   2 dogs, muts [place] and [place], exercise 3-4 days per week with walking, Yoga, weights, CPA.  Customer service manager at [employer]:    Constitutional: No weight loss Cardiovascular: No chest pain Respiratory: Positive for SOB  Gastrointestinal: See HPI and otherwise negative   Physical Exam:  Vital signs: BP (!) 106/58   Pulse 70   Ht 5\' 2"  (1.575 m)   Wt 109 lb (49.4 kg)   BMI 19.94 kg/m    Constitutional:   Pleasant Caucasian female appears to be in NAD, Well developed, Well nourished, alert and cooperative Respiratory: Respirations even and unlabored. Lungs clear to auscultation bilaterally.  No wheezes, crackles, or rhonchi.  Cardiovascular: Normal S1, S2. No MRG. Regular rate and rhythm. No peripheral edema, cyanosis or pallor.  Gastrointestinal:  Soft, nondistended, nontender. No rebound or guarding. Normal bowel sounds. No appreciable masses or hepatomegaly. Psychiatric:  Demonstrates good judgement and reason without abnormal affect or behaviors.  MOST RECENT LABS: CBC    Component Value Date/Time   WBC 18.7 (H) 01/08/2016 1939   RBC 4.14 01/08/2016 1939   HGB 13.1 01/08/2016 1939   HCT 37.1 01/08/2016 1939   PLT 394 01/08/2016 1939   MCV 89.6 01/08/2016 1939   MCH 31.6 01/08/2016 1939   MCHC 35.3 01/08/2016 1939   RDW 11.8 01/08/2016 1939   LYMPHSABS 1.3 01/08/2016 1939   MONOABS 0.4 01/08/2016 1939   EOSABS 0.1 01/08/2016 1939   BASOSABS 0.0 01/08/2016 1939    CMP     Component Value Date/Time   NA 140 01/08/2016 1939   K 3.6 01/08/2016 1939   CL 105 01/08/2016 1939   CO2 26 01/08/2016 1939   GLUCOSE 102 (H) 01/08/2016 1939   BUN 22 (H) 01/08/2016 1939   CREATININE 0.92 01/08/2016 1939   CREATININE 0.87 09/21/2015 1154   CALCIUM 9.6 01/08/2016 1939   PROT 8.2 (H) 01/08/2016 1939   ALBUMIN 4.8 01/08/2016 1939   AST 26 01/08/2016 1939   ALT 24 01/08/2016 1939   ALKPHOS 85 01/08/2016 1939    BILITOT 0.9 01/08/2016 1939   GFRNONAA >60 01/08/2016 1939   GFRAA >60 01/08/2016 1939   Assessment: 1. Epigastric pain: Patient describes an epigastric pain which radiates around to her left upper quadrant, associated with some nausea and bloating as well as constipation; likely constipation is contributing, consider gastritis versus other 2. IBS bloating: As discussed at last office visit, this could represent SIBO, patient had hydrogen breath test ordered which she was unable to complete, reordered today 3. Nausea: With epigastric pain above, intermittent 4. Constipation: Change for the patient over the past 3-4 months, patient only has a bowel movement every 2-3 days and this is "small bits", never feels completely empty, has not tried anything to help this yet, most likely related to IBS  Plan: 1. Reordered hydrogen breath testing at [facility]. If positive patient would likely benefit from rifaximin for SIBO/IBS with bloating. 2. Recommend the patient increase fiber in her diet to at least 25-35 g for daily use of fiber supplement. She should also increase exercise to at least 30 minutes per day and increase water intake to at least 6- 8 8 oz glasses per day. 3. Recommend the patient use her Nexium 40 mg once daily for the next 2 weeks 4. Patient to follow in clinic with Dr. Hilarie Cooke in next 3-4 weeks  Ellouise Newer, PA-C [facility] 03/06/2016, 9:51 AM  Cc: Carlena Hurl, PA-C   Addendum: Reviewed and agree with initial management. Hydrogen breath testing by mail given unavailability now at [place], [place]

## 2016-03-18 ENCOUNTER — Telehealth: Payer: Self-pay | Admitting: Medical

## 2016-03-18 NOTE — Telephone Encounter (Signed)
Back pain worse, tingling going into foot more frequent, states you had talked about referring her if got worse.  Please call pt & advise what to do

## 2016-03-19 NOTE — Telephone Encounter (Signed)
Refer to [facility] to Dr. Mina Marble for chronic back pain, paresthesias, bulging disc

## 2016-03-19 NOTE — Telephone Encounter (Signed)
Send referral to [place] [facility]. To dr. Mina Marble

## 2016-03-20 ENCOUNTER — Other Ambulatory Visit: Payer: Self-pay | Admitting: Medical

## 2016-03-20 NOTE — Telephone Encounter (Signed)
Is this okay to refill? 

## 2016-03-30 ENCOUNTER — Encounter: Payer: Self-pay | Admitting: Internal Medicine

## 2016-04-17 ENCOUNTER — Telehealth: Payer: Self-pay | Admitting: Internal Medicine

## 2016-04-17 NOTE — Telephone Encounter (Signed)
This is a Dr. Hilarie Fredrickson patient.  I have faxed back signed order

## 2016-04-24 ENCOUNTER — Encounter: Payer: Self-pay | Admitting: Internal Medicine

## 2016-04-24 DIAGNOSIS — K6389 Other specified diseases of intestine: Secondary | ICD-10-CM | POA: Insufficient documentation

## 2016-04-25 ENCOUNTER — Telehealth: Payer: Self-pay | Admitting: *Deleted

## 2016-04-25 MED ORDER — RIFAXIMIN 550 MG PO TABS: 550.0000 mg | ORAL_TABLET | Freq: Three times a day (TID) | ORAL | 0 refills | Status: DC

## 2016-04-25 NOTE — Telephone Encounter (Signed)
[facility] Small Intestinal Bacterial Overgrowth Test has resulted. Per Dr Hilarie Fredrickson, "SIBO + by breath test. Helps explain GI symptoms (bloating, etc.) Please treat with rifaximin 550 mg TID x 14 days then ROV. JMP"  I have spoken to patient to advise her of the information above. She verbalizes understanding. Rx has been sent to [facility] pharmacy and she will come for office visit with Dr Hilarie Fredrickson on 06/11/16 @ 8:45 am.

## 2016-04-28 ENCOUNTER — Telehealth: Payer: Self-pay | Admitting: Internal Medicine

## 2016-04-28 NOTE — Telephone Encounter (Signed)
Office note faxed.

## 2016-05-01 ENCOUNTER — Telehealth: Payer: Self-pay | Admitting: Internal Medicine

## 2016-05-02 NOTE — Telephone Encounter (Signed)
I have left a voicemail for patient to call back that we will provide samples of xifaxan for her 14 day supply. She does not need to pay $300.00.

## 2016-06-05 ENCOUNTER — Encounter: Payer: Self-pay | Admitting: Internal Medicine

## 2016-06-11 ENCOUNTER — Ambulatory Visit: Payer: BLUE CROSS/BLUE SHIELD | Admitting: Internal Medicine

## 2016-08-04 ENCOUNTER — Encounter: Payer: Self-pay | Admitting: *Deleted

## 2016-08-07 ENCOUNTER — Encounter: Payer: Self-pay | Admitting: Family Medicine

## 2016-08-07 ENCOUNTER — Ambulatory Visit (INDEPENDENT_AMBULATORY_CARE_PROVIDER_SITE_OTHER): Payer: BLUE CROSS/BLUE SHIELD | Admitting: Family Medicine

## 2016-08-07 VITALS — BP 120/80 | HR 62 | Temp 97.7°F | Wt 111.2 lb

## 2016-08-07 DIAGNOSIS — N3001 Acute cystitis with hematuria: Secondary | ICD-10-CM

## 2016-08-07 DIAGNOSIS — R300 Dysuria: Secondary | ICD-10-CM | POA: Diagnosis not present

## 2016-08-07 LAB — POCT URINALYSIS DIPSTICK
BILIRUBIN UA: NEGATIVE
Glucose, UA: NEGATIVE
KETONES UA: NEGATIVE
LEUKOCYTES UA: NEGATIVE
NITRITE UA: NEGATIVE
PROTEIN UA: NEGATIVE
Spec Grav, UA: >=1.03 — AB (ref 1.010–1.025)
Urobilinogen, UA: NEGATIVE E.U./dL — AB
pH, UA: 6 (ref 5.0–8.0)

## 2016-08-07 MED ORDER — SULFAMETHOXAZOLE-TRIMETHOPRIM 800-160 MG PO TABS: 1.0000 | ORAL_TABLET | Freq: Two times a day (BID) | ORAL | 0 refills | Status: DC

## 2016-08-07 NOTE — Progress Notes (Signed)
Subjective: Chief Complaint  Patient presents with  . UTI    UTI- Pressure, burning with urination last couple days     [name] is a 56 y.o. female who complains of possible urinary tract infection.  She has had symptoms for 2 days.  Symptoms include back pain and burning with urination, lower abdominal pain. Patient denies fever, chills, nausea, vomiting, abnormal vaginal discharge.  Last UTI was years ago.  Using nothing for current symptoms.  History of kidney stones and states this does not feel the same.   LMP: 3 weeks ago.  She sees OB/GYN for recurrent BV and yeast infections.    Patient does not have a history of recurrent UTI. Patient does not have a history of pyelonephritis.  No other aggravating or relieving factors.  No other c/o.  Past Medical History:  Diagnosis Date  . Acute pancreatitis   . Anxiety   . Asthma   . Chronic back pain   . Eosinophilic esophagitis   . Esophageal spasm   . Esophageal stricture   . GERD (gastroesophageal reflux disease)   . Headache    occasional  . IBS (irritable bowel syndrome)   . Kidney stone 2015   gets every few years, hx/o calcium oxalate stones  . Osteopenia   . Scoliosis   . Seasonal allergic conjunctivitis   . Seasonal allergic rhinitis   . Small intestinal bacterial overgrowth   . Vitamin D deficiency   . Wears glasses     ROS as in subjective  Reviewed allergies, medications, past medical, surgical, and social history.    Objective: Vitals:   08/07/16 0812  BP: 120/80  Pulse: 62  Temp: 97.7 F (36.5 C)    General appearance: alert, no distress, WD/WN, female Abdomen: +bs, soft, non tender, non distended, no masses, no hepatomegaly, no splenomegaly, no bruits Back: no CVA tenderness GU: declined      Laboratory:  Urine dipstick: sp gravity 1.030, blood 1+.       Assessment: Acute cystitis with hematuria - Plan: sulfamethoxazole-trimethoprim (BACTRIM DS,SEPTRA DS) 800-160 MG tablet  Dysuria -  Plan: POCT urinalysis dipstick    Plan: Discussed symptoms, diagnosis, possible complications, and usual course of illness.  Bactrim sent to pharmacy.  Advised increased water intake, can use OTC Tylenol for pain.    Advised to call or return if not back to baseline after completing the antibiotic.    Urine culture was not sent.    Call or return if worse or not improving.

## 2016-08-19 ENCOUNTER — Encounter: Payer: Self-pay | Admitting: Internal Medicine

## 2016-08-19 ENCOUNTER — Ambulatory Visit (INDEPENDENT_AMBULATORY_CARE_PROVIDER_SITE_OTHER): Payer: BLUE CROSS/BLUE SHIELD | Admitting: Internal Medicine

## 2016-08-19 VITALS — BP 120/62 | HR 64 | Ht 62.0 in | Wt 110.4 lb

## 2016-08-19 DIAGNOSIS — K200 Eosinophilic esophagitis: Secondary | ICD-10-CM

## 2016-08-19 DIAGNOSIS — K6389 Other specified diseases of intestine: Secondary | ICD-10-CM

## 2016-08-19 DIAGNOSIS — K5900 Constipation, unspecified: Secondary | ICD-10-CM

## 2016-08-19 NOTE — Patient Instructions (Signed)
Please purchase VSL #3 and take 1 capsule per day. Discontinue your other probiotic.  Continue Nexium.  Please follow up with Dr Hilarie Fredrickson in 3 months.  Call our office if your bloating worsens.  If you are age 55 or older, your body mass index should be between 23-30. Your Body mass index is 20.19 kg/m. If this is out of the aforementioned range listed, please consider follow up with your Primary Care Provider.  If you are age 35 or younger, your body mass index should be between 19-25. Your Body mass index is 20.19 kg/m. If this is out of the aformentioned range listed, please consider follow up with your Primary Care Provider.

## 2016-08-21 NOTE — Progress Notes (Signed)
Subjective:    Patient ID: [name], female    DOB: [birth year], 56 y.o.   MRN: [id number]  HPI [name] is a 56 year old female with a history of eosinophilic esophagitis, GERD, IBS and fairly recent diagnosis of small intestinal bacterial overgrowth who returns for follow-up. She was last seen in the office on 03/06/2016 by Ellouise Newer, PA-C. At her last visit she has continued to have primarily epigastric abdominal discomfort with significant abdominal bloating. Abdominal bloating was significant after every meal and almost always resolved by morning. She had had some occasional nausea but no vomiting. At that time she was having some mild constipation whereas usual for her bowel movement would occur every 1-2 days but she was going as long as 2-3 days. Bowel movement. Without bowel movement she would feel lower abdominal "fullness or heaviness". She did add fiber in the form of a gummy which has helped. Her bowel movements have become more regular and are now back to normal for her. She has not had blood in her stool or melena. She is tried probiotics but when used daily this seems to cause her stools to be loose. She is now using them every other day to every 2-3 days.  A SIBO hydrogen and methane breath test was performed and positive. She was then treated with rifaximin 550 mg 3 times a day 14 days. She reports that during this medication she fell slightly ill without any identifiable pain or specificity. After this for about 5 days her bloating had completely resolved. She felt extremely well. However her abdominal bloating symptoms return very shortly and within a week of completion of therapy. At this point her bloating is better" definitely tolerable" but only about 50% better for her. She has reviewed the FODMAP diet and reports she tries to avoid foods high in FODMAPs.  Her reflux has been well controlled with Nexium 40 mg daily. She denies dysphagia recently.   Review of  Systems As per history of present illness, otherwise negative  Current Medications, Allergies, Past Medical History, Past Surgical History, Family History and Social History were reviewed in [employer] record.     Objective:   Physical Exam BP 120/62   Pulse 64   Ht 5\' 2"  (1.575 m)   Wt 110 lb 6.4 oz (50.1 kg)   BMI 20.19 kg/m  Constitutional: Well-developed and well-nourished. No distress. HEENT: Normocephalic and atraumatic.  Conjunctivae are normal.  No scleral icterus. Neck: Neck supple. Trachea midline. Cardiovascular: Normal rate, regular rhythm and intact distal pulses. No M/R/G Pulmonary/chest: Effort normal and breath sounds normal. No wheezing, rales or rhonchi. Abdominal: Soft, nontender, nondistended. Bowel sounds active throughout. There are no masses palpable. No hepatosplenomegaly. Extremities: no clubbing, cyanosis, or edema Neurological: Alert and oriented to person place and time. Skin: Skin is warm and dry. Psychiatric: Normal mood and affect. Behavior is normal.  [facility] SIBO Report -- positive     Assessment & Plan:  56 year old female with a history of eosinophilic esophagitis, GERD, IBS and fairly recent diagnosis of small intestinal bacterial overgrowth who returns for follow-up.  1. SIBO -- definitive improvement but for a very short period of time after rifaximin. Unclear if we need to retreat or if her remaining bloating is more secondary to an irritable bowel type response. I have recommended that we schedule a different probiotic as intermittent probiotic use can worsen bloating. I'm going to begin VSL 3, 1 capsule daily. She will stop her other probiotic.  If bloating fails to improve further or worsens before follow-up I would recommend treatment with Augmentin. If after Augmentin treatment bloating persists I would recommend we formally retest for SIBO. She will continue to avoid foods high in FODMAP.  2. GERD with  history of EoE -- continue Nexium 40 mg daily. Symptoms currently well maintained without dysphagia.  3. CRC screening -- due for repeat screening in June 2020  25 minutes spent with the patient today. Greater than 50% was spent in counseling and coordination of care with the patient

## 2016-08-28 LAB — HM DEXA SCAN

## 2016-10-09 DIAGNOSIS — N200 Calculus of kidney: Secondary | ICD-10-CM | POA: Diagnosis not present

## 2016-10-21 ENCOUNTER — Other Ambulatory Visit: Payer: Self-pay | Admitting: Medical

## 2016-10-21 ENCOUNTER — Encounter: Payer: Self-pay | Admitting: Medical

## 2016-10-21 ENCOUNTER — Ambulatory Visit (INDEPENDENT_AMBULATORY_CARE_PROVIDER_SITE_OTHER): Payer: 59 | Admitting: Medical

## 2016-10-21 VITALS — BP 114/72 | HR 58 | Ht 62.0 in | Wt 109.4 lb

## 2016-10-21 DIAGNOSIS — Z7189 Other specified counseling: Secondary | ICD-10-CM

## 2016-10-21 DIAGNOSIS — Z0000 Encounter for general adult medical examination without abnormal findings: Secondary | ICD-10-CM | POA: Diagnosis not present

## 2016-10-21 DIAGNOSIS — K219 Gastro-esophageal reflux disease without esophagitis: Secondary | ICD-10-CM

## 2016-10-21 DIAGNOSIS — M549 Dorsalgia, unspecified: Secondary | ICD-10-CM

## 2016-10-21 DIAGNOSIS — J4530 Mild persistent asthma, uncomplicated: Secondary | ICD-10-CM | POA: Diagnosis not present

## 2016-10-21 DIAGNOSIS — Z1159 Encounter for screening for other viral diseases: Secondary | ICD-10-CM

## 2016-10-21 DIAGNOSIS — K589 Irritable bowel syndrome without diarrhea: Secondary | ICD-10-CM | POA: Diagnosis not present

## 2016-10-21 DIAGNOSIS — Z87442 Personal history of urinary calculi: Secondary | ICD-10-CM | POA: Diagnosis not present

## 2016-10-21 DIAGNOSIS — M8580 Other specified disorders of bone density and structure, unspecified site: Secondary | ICD-10-CM | POA: Diagnosis not present

## 2016-10-21 DIAGNOSIS — G8929 Other chronic pain: Secondary | ICD-10-CM

## 2016-10-21 DIAGNOSIS — J301 Allergic rhinitis due to pollen: Secondary | ICD-10-CM | POA: Diagnosis not present

## 2016-10-21 DIAGNOSIS — E559 Vitamin D deficiency, unspecified: Secondary | ICD-10-CM | POA: Diagnosis not present

## 2016-10-21 DIAGNOSIS — M4125 Other idiopathic scoliosis, thoracolumbar region: Secondary | ICD-10-CM | POA: Diagnosis not present

## 2016-10-21 DIAGNOSIS — Z7185 Encounter for immunization safety counseling: Secondary | ICD-10-CM

## 2016-10-21 LAB — CBC
HCT: 39 % (ref 35.0–45.0)
Hemoglobin: 13.2 g/dL (ref 11.7–15.5)
MCH: 32.4 pg (ref 27.0–33.0)
MCHC: 33.8 g/dL (ref 32.0–36.0)
MCV: 95.8 fL (ref 80.0–100.0)
MPV: 9.7 fL (ref 7.5–12.5)
PLATELETS: 347 10*3/uL (ref 140–400)
RBC: 4.07 MIL/uL (ref 3.80–5.10)
RDW: 12.7 % (ref 11.0–15.0)
WBC: 6.4 10*3/uL (ref 4.0–10.5)

## 2016-10-21 LAB — HEPATITIS C ANTIBODY: HCV Ab: NONREACTIVE

## 2016-10-21 LAB — POCT URINALYSIS DIP (PROADVANTAGE DEVICE)
BILIRUBIN UA: NEGATIVE
BILIRUBIN UA: NEGATIVE mg/dL
Blood, UA: NEGATIVE
Glucose, UA: NEGATIVE mg/dL
LEUKOCYTES UA: NEGATIVE
NITRITE UA: NEGATIVE
Protein Ur, POC: NEGATIVE mg/dL
Specific Gravity, Urine: 1.03
UUROB: NEGATIVE
pH, UA: 6 (ref 5.0–8.0)

## 2016-10-21 LAB — TSH: TSH: 2.27 m[IU]/L

## 2016-10-21 MED ORDER — ALBUTEROL SULFATE HFA 108 (90 BASE) MCG/ACT IN AERS: 2.0000 | INHALATION_SPRAY | Freq: Four times a day (QID) | RESPIRATORY_TRACT | 1 refills | Status: DC | PRN

## 2016-10-21 MED ORDER — FLUTICASONE FUROATE-VILANTEROL 200-25 MCG/INH IN AEPB: 1.0000 | INHALATION_SPRAY | Freq: Every day | RESPIRATORY_TRACT | 0 refills | Status: DC

## 2016-10-21 NOTE — Progress Notes (Signed)
Subjective:   HPI  [name] is a 56 y.o. female who presents for a complete physical.  Medical care team includes:  Gyn - Dr. Alfred Levins, [facility]  GI - Dr. Zenovia Jarred  Dr. Junious Silk - urology  Eye doctor and dentist  dermatology  Tysinger, Camelia Eng, PA-C here for primary care  Concerns: Had updated bone density scan through gynecology few months ago.   Still showing osteopenia.   Exercising - some, was having some recent back issues.   But in general walks, hikes, bike occasionally.   Not much weight bearing exercise.  taking Vit D 800mg  daily OTC and 1200mg  Calcium daily.  GERD - sees GI again soon.   Not sure about colonoscopy repeat date, but does have to use Nexium somewhat regularly due to moderate GERD problems ongoing.  Has had to stretch esophagus occasionally.     Asthma - no recent issues.  But does get flare up from time to time.  Uses albuterol prn.   No other recent issues.  Reviewed their medical, surgical, family, social, medication, and allergy history and updated chart as appropriate.  Past Medical History:  Diagnosis Date  . Anxiety   . Asthma   . Chronic back pain   . Eosinophilic esophagitis   . Esophageal spasm   . Esophageal stricture   . GERD (gastroesophageal reflux disease)   . Headache    occasional  . IBS (irritable bowel syndrome)   . Kidney stone 2015   gets every few years, hx/o calcium oxalate stones  . Osteopenia   . Scoliosis   . Seasonal allergic conjunctivitis   . Seasonal allergic rhinitis   . Small intestinal bacterial overgrowth   . Vitamin D deficiency   . Wears glasses     Past Surgical History:  Procedure Laterality Date  . APPENDECTOMY    . COLONOSCOPY  2013   [place], [place]  . ESOPHAGEAL DILATION     several times prior, [place], [place]  . ESOPHAGOPLASTY  06/1960   complication of esophageal dilitation, hospitalization at [facility] Bilateral 09/14/2015   Procedure:  INCISION AND DRAINAGE ABSCESS;  Surgeon: Leanora Cover, MD;  Location: [facility];  Service: Orthopedics;  Laterality: Bilateral;  . [place]  . LITHOTRIPSY     4x prior as of 05/2014    Social History   Social History  . Marital status: Single    Spouse name: N/A  . Number of children: N/A  . Years of education: N/A   Occupational History  . Not on file.   Social History Main Topics  . Smoking status: Never Smoker  . Smokeless tobacco: Never Used  . Alcohol use 4.2 oz/week    7 Glasses of wine per week  . Drug use: No  . Sexual activity: Not on file   Other Topics Concern  . Not on file   Social History Narrative   2 dogs, muts [place], exercise 3-4 days per week with walking, Yoga, weights, CPA.  Customer service manager at [employer]    Family History  Problem Relation Age of Onset  . Diabetes Father   . Kidney Stones Father   . Diabetes Sister   . Kidney Stones Brother   . Migraines Brother   . Heart disease Maternal Grandfather   . Cancer Neg Hx   . Stroke Neg Hx   . Hypertension Neg Hx  Current Outpatient Prescriptions:  .  albuterol (PROVENTIL HFA;VENTOLIN HFA) 108 (90 Base) MCG/ACT inhaler, Inhale 2 puffs into the lungs every 6 (six) hours as needed for wheezing or shortness of breath., Disp: 1 Inhaler, Rfl: 1 .  diclofenac (VOLTAREN) 75 MG EC tablet, TAKE 1 TABLET BY MOUTH TWICE A DAY (Patient taking differently: TAKE 1 TABLET BY MOUTH TWICE A DAY as needed), Disp: 45 tablet, Rfl: 0 .  esomeprazole (NEXIUM) 40 MG capsule, Take 1 capsule (40 mg total) by mouth daily at 12 noon. (Patient taking differently: Take 40 mg by mouth as needed. ), Disp: 30 capsule, Rfl: 3 .  estradiol (VIVELLE-DOT) 0.1 MG/24HR patch, Place 1 patch onto the skin once a week. , Disp: , Rfl: 1 .  fluticasone (FLOVENT HFA) 220 MCG/ACT inhaler, Inhale 2 puffs into the lungs 2 (two) times daily. (Patient taking differently: Inhale 2 puffs into the  lungs 2 (two) times daily as needed (sob). ), Disp: 1 Inhaler, Rfl: 11 .  LORazepam (ATIVAN) 0.5 MG tablet, Take 1 tablet (0.5 mg total) by mouth 2 (two) times daily as needed for anxiety., Disp: 60 tablet, Rfl: 0 .  progesterone (PROMETRIUM) 200 MG capsule, Take 200 mg by mouth daily., Disp: , Rfl:  .  fluticasone furoate-vilanterol (BREO ELLIPTA) 200-25 MCG/INH AEPB, Inhale 1 puff into the lungs daily., Disp: 1 each, Rfl: 0  Current Facility-Administered Medications:  .  lactulose (CHRONULAC) 10 GM/15ML solution 25 g, 25 g, Oral, Once, Lemmon, Lavone Nian, PA  No Known Allergies   Review of Systems Constitutional: -fever, -chills, -sweats, -unexpected weight change, -decreased appetite, -fatigue Allergy: -sneezing, -itching, -congestion Dermatology: -changing moles, --rash, -lumps ENT: -runny nose, -ear pain, -sore throat, -hoarseness, -sinus pain, -teeth pain, - ringing in ears, -hearing loss, -nosebleeds Cardiology: -chest pain, -palpitations, -swelling, -difficulty breathing when lying flat, -waking up short of breath Respiratory: -cough, +shortness of breath, -difficulty breathing with exercise or exertion, -wheezing, -coughing up blood Gastroenterology: +abdominal pain, -nausea, -vomiting, -diarrhea, -constipation, -blood in stool, -changes in bowel movement, -difficulty swallowing or eating Hematology: -bleeding, -bruising  Musculoskeletal: -joint aches, -muscle aches, -joint swelling, +back pain, -neck pain, -cramping, -changes in gait Ophthalmology: denies vision changes, eye redness, itching, discharge Urology: -burning with urination, -difficulty urinating, -blood in urine, -urinary frequency, -urgency, -incontinence Neurology: -headache, -weakness, -tingling, -numbness, -memory loss, -falls, -dizziness Psychology: -depressed mood, -agitation, -sleep problems     Objective:   Physical Exam  BP 114/72   Pulse (!) 58   Ht 5\' 2"  (1.575 m)   Wt 109 lb 6.4 oz (49.6 kg)    SpO2 96%   BMI 20.01 kg/m   Wt Readings from Last 3 Encounters:  10/21/16 109 lb 6.4 oz (49.6 kg)  08/19/16 110 lb 6.4 oz (50.1 kg)  08/07/16 111 lb 3.2 oz (50.4 kg)   General appearance: alert, no distress, WD/WN, lean white female Skin: scattered macules, left forearm distally with linear small surgical scar, few scattered roundish scars on left forearm from prior cryotherapy, no worrisome lesions HEENT: normocephalic, conjunctiva/corneas normal, sclerae anicteric, PERRLA, EOMi, nares patent, no discharge or erythema, pharynx normal Oral cavity: MMM, tongue normal, teeth normal Neck: supple, no lymphadenopathy, no thyromegaly, no masses, normal ROM, no bruits Chest: non tender, normal shape and expansion Heart: RRR, normal S1, S2, no murmurs Lungs: CTA bilaterally, no wheezes, rhonchi, or rales Abdomen: +bs, port surgical scar inferior umbilicus, soft, non tender, non distended, no masses, no hepatomegaly, no splenomegaly, no bruits Back: non tender, normal ROM, +moderate  scoliosis Musculoskeletal: upper extremities non tender, no obvious deformity, normal ROM throughout, lower extremities non tender, no obvious deformity, normal ROM throughout Extremities: no edema, no cyanosis, no clubbing Pulses: 2+ symmetric, upper and lower extremities, normal cap refill Neurological: alert, oriented x 3, CN2-12 intact, strength normal upper extremities and lower extremities, sensation normal throughout, DTRs 2+ throughout, no cerebellar signs, gait normal Psychiatric: normal affect, behavior normal, pleasant  Breast/gyn/rectal - deferred to gyn    Assessment and Plan :    Encounter Diagnoses  Name Primary?  . Routine general medical examination at a health care facility Yes  . Mild persistent asthma without complication   . Allergic rhinitis due to pollen, unspecified seasonality   . Gastroesophageal reflux disease without esophagitis   . Irritable bowel syndrome, unspecified type   .  History of renal calculi   . Chronic back pain, unspecified back location, unspecified back pain laterality   . Vitamin D deficiency   . Other idiopathic scoliosis, thoracolumbar region   . Osteopenia, unspecified location   . Vaccine counseling   . Need for hepatitis C screening test     Physical exam - discussed healthy lifestyle, diet, exercise, preventative care, vaccinations, and addressed their concerns.  Handout given. Routine labs today See your eye doctor yearly for routine vision care. See your dentist yearly for routine dental care including hygiene visits twice yearly. See your gynecologist yearly for routine gynecological care.  We will have her sign for records from last pap and bone density scan gerd- discussed risks of medication, f/u with GI soon as planned Asthma - PFT abnormal.   Advised she take her Flovent daily or begin trial of Breo, c/t albuterol prn, f/u in 35mo Osteopenia - reviewed 2016 Bone Density scan.   C/t routine exercise, healthy diet.  discussed Ca + Vit regularly.  Risk factors include weight less than 27 lb, hx/o fracture as adult, Vit D deficiency, white female, on GERD medication, menopausal. Follow-up pending labs  Mearl was seen today for annual exam.  Diagnoses and all orders for this visit:  Routine general medical examination at a health care facility -     POCT Urinalysis DIP (Proadvantage Device) -     Comprehensive metabolic panel -     CBC -     TSH -     VITAMIN D 25 Hydroxy (Vit-D Deficiency, Fractures) -     Hepatitis C antibody  Mild persistent asthma without complication -     Spirometry with Graph  Allergic rhinitis due to pollen, unspecified seasonality  Gastroesophageal reflux disease without esophagitis  Irritable bowel syndrome, unspecified type  History of renal calculi  Chronic back pain, unspecified back location, unspecified back pain laterality  Vitamin D deficiency -     VITAMIN D 25 Hydroxy (Vit-D Deficiency,  Fractures)  Other idiopathic scoliosis, thoracolumbar region  Osteopenia, unspecified location  Vaccine counseling  Need for hepatitis C screening test -     Hepatitis C antibody  Other orders -     albuterol (PROVENTIL HFA;VENTOLIN HFA) 108 (90 Base) MCG/ACT inhaler; Inhale 2 puffs into the lungs every 6 (six) hours as needed for wheezing or shortness of breath. -     fluticasone furoate-vilanterol (BREO ELLIPTA) 200-25 MCG/INH AEPB; Inhale 1 puff into the lungs daily.

## 2016-10-21 NOTE — Patient Instructions (Signed)
Recommendations:  See your eye doctor yearly for routine vision care.  See your dentist yearly for routine dental care including hygiene visits twice yearly.  See your gynecologist yearly for routine gynecological care.  Follow up with gastroenterology as planned, ask them about when your next colonoscopy should be?  You may be due soon.  I recommend you have a shingles vaccine to help prevent shingles or herpes zoster outbreak.   Please call your insurer to inquire about coverage for the Shingrix vaccine given in 2 doses.   Some insurers cover this vaccine after age 61, some cover this after age 71.  If your insurer covers this, then call to schedule appointment to have this vaccine here.  Also call insurance about pneumococcal vaccine.  I recommend this too given history of asthma  Get a yearly flu shot in the fall  Use Albuterol inhaler as needed for wheezing and shortness of breath, particularly when your GERD flares up  Continue 1200mg  calcium daily and 800mg  Vitamin D daily.  Get weight bearing exercise regularly   Osteoporosis Osteoporosis is the thinning and loss of density in the bones. Osteoporosis makes the bones more brittle, fragile, and likely to break (fracture). Over time, osteoporosis can cause the bones to become so weak that they fracture after a simple fall. The bones most likely to fracture are the bones in the hip, wrist, and spine. What are the causes? The exact cause is not known. What increases the risk? Anyone can develop osteoporosis. You may be at greater risk if you have a family history of the condition or have poor nutrition. You may also have a higher risk if you are:  Female.  55 years old or older.  A smoker.  Not physically active.  White or Asian.  Slender.  What are the signs or symptoms? A fracture might be the first sign of the disease, especially if it results from a fall or injury that would not usually cause a bone to break. Other  signs and symptoms include:  Low back and neck pain.  Stooped posture.  Height loss.  How is this diagnosed? To make a diagnosis, your health care provider may:  Take a medical history.  Perform a physical exam.  Order tests, such as: ? A bone mineral density test. ? A dual-energy X-ray absorptiometry test.  How is this treated? The goal of osteoporosis treatment is to strengthen your bones to reduce your risk of a fracture. Treatment may involve:  Making lifestyle changes, such as: ? Eating a diet rich in calcium. ? Doing weight-bearing and muscle-strengthening exercises. ? Stopping tobacco use. ? Limiting alcohol intake.  Taking medicine to slow the process of bone loss or to increase bone density.  Monitoring your levels of calcium and vitamin D.  Follow these instructions at home:  Include calcium and vitamin D in your diet. Calcium is important for bone health, and vitamin D helps the body absorb calcium.  Perform weight-bearing and muscle-strengthening exercises as directed by your health care provider.  Do not use any tobacco products, including cigarettes, chewing tobacco, and electronic cigarettes. If you need help quitting, ask your health care provider.  Limit your alcohol intake.  Take medicines only as directed by your health care provider.  Keep all follow-up visits as directed by your health care provider. This is important.  Take precautions at home to lower your risk of falling, such as: ? Keeping rooms well lit and clutter free. ? Installing safety rails  on stairs. ? Using rubber mats in the bathroom and other areas that are often wet or slippery. Get help right away if: You fall or injure yourself. This information is not intended to replace advice given to you by your health care provider. Make sure you discuss any questions you have with your health care provider. Document Released: 12/18/2004 Document Revised: 08/13/2015 Document Reviewed:  08/18/2013 Elsevier Interactive Patient Education  2017 [employer].

## 2016-10-22 LAB — COMPREHENSIVE METABOLIC PANEL
ALK PHOS: 63 U/L (ref 33–130)
ALT: 25 U/L (ref 6–29)
AST: 28 U/L (ref 10–35)
Albumin: 4.2 g/dL (ref 3.6–5.1)
BILIRUBIN TOTAL: 0.5 mg/dL (ref 0.2–1.2)
BUN: 15 mg/dL (ref 7–25)
CALCIUM: 9.1 mg/dL (ref 8.6–10.4)
CO2: 24 mmol/L (ref 20–31)
Chloride: 104 mmol/L (ref 98–110)
Creat: 0.74 mg/dL (ref 0.50–1.05)
Glucose, Bld: 77 mg/dL (ref 65–99)
Potassium: 4.7 mmol/L (ref 3.5–5.3)
Sodium: 139 mmol/L (ref 135–146)
TOTAL PROTEIN: 6.6 g/dL (ref 6.1–8.1)

## 2016-10-22 LAB — VITAMIN D 25 HYDROXY (VIT D DEFICIENCY, FRACTURES): VIT D 25 HYDROXY: 66 ng/mL (ref 30–100)

## 2016-10-24 ENCOUNTER — Telehealth: Payer: Self-pay

## 2016-10-24 ENCOUNTER — Encounter: Payer: Self-pay | Admitting: Medical

## 2016-10-24 LAB — LIPID PANEL
Cholesterol: 181 mg/dL (ref <200)
HDL: 77 mg/dL (ref >50)
LDL CALC: 87 mg/dL (ref <100)
Total CHOL/HDL Ratio: 2.4 Ratio (ref <5.0)
Triglycerides: 83 mg/dL (ref <150)
VLDL: 17 mg/dL (ref <30)

## 2016-10-24 NOTE — Telephone Encounter (Signed)
Gave to sandy to add.

## 2016-10-24 NOTE — Telephone Encounter (Signed)
Yes, add Lipid panel

## 2016-10-24 NOTE — Telephone Encounter (Signed)
Pt called and wanted to know if she can get her cholesterol check, she notice on the last to visit this wasn't not done.

## 2016-10-24 NOTE — Telephone Encounter (Signed)
Records from [facility] placed in your folder for review. Victorino December

## 2016-10-30 ENCOUNTER — Ambulatory Visit (INDEPENDENT_AMBULATORY_CARE_PROVIDER_SITE_OTHER): Payer: 59 | Admitting: Gastroenterology

## 2016-10-30 ENCOUNTER — Encounter: Payer: Self-pay | Admitting: Gastroenterology

## 2016-10-30 VITALS — BP 104/68 | HR 72 | Ht 62.0 in | Wt 108.0 lb

## 2016-10-30 DIAGNOSIS — R1319 Other dysphagia: Secondary | ICD-10-CM

## 2016-10-30 DIAGNOSIS — R1013 Epigastric pain: Secondary | ICD-10-CM | POA: Diagnosis not present

## 2016-10-30 DIAGNOSIS — R140 Abdominal distension (gaseous): Secondary | ICD-10-CM | POA: Insufficient documentation

## 2016-10-30 NOTE — Progress Notes (Addendum)
10/30/2016 [name] [id number] [birth year]   HISTORY OF PRESENT ILLNESS:  This is a 56 year old female who is known to Dr. Hilarie Fredrickson.  She has past GI history of IBS with bloating, GERD, eosinophilic esophagitis. At the time of last EGD with dilation in 2015 she ended up with a minor esophageal tear that required hospitalization but was managed conservatively (at [facility]). She present to our office today with complaints of epigastric pain with eating for the past few weeks.  She says that the pain is sharp in nature, but in between eating she does not have any pain at all. It is occurring every time that she eats, however.  She describes some intermittent mild dysphagia. She has a little bit of nausea, but no vomiting.  Has been on Nexium 40 mg daily for quite some time.  Also with her history of IBS she continues to complain of daily abdominal bloating after everything that she eats or drinks. Was treated for small intestinal mitral overgrowth in the past. Says that she has been taking Gas-X with meals and that does seem to help some.  Past Medical History:  Diagnosis Date  . Anxiety   . Asthma   . Chronic back pain   . Eosinophilic esophagitis   . Esophageal spasm   . Esophageal stricture   . GERD (gastroesophageal reflux disease)   . Headache    occasional  . IBS (irritable bowel syndrome)   . Kidney stone 2015   gets every few years, hx/o calcium oxalate stones  . Osteopenia   . Scoliosis   . Seasonal allergic conjunctivitis   . Seasonal allergic rhinitis   . Small intestinal bacterial overgrowth   . Vitamin D deficiency   . Wears glasses    Past Surgical History:  Procedure Laterality Date  . APPENDECTOMY    . COLONOSCOPY  2013   [place], [place]  . ESOPHAGEAL DILATION     several times prior, [place], [place]  . ESOPHAGOPLASTY  04/6946   complication of esophageal dilitation, hospitalization at [facility] Bilateral 09/14/2015   Procedure: INCISION AND DRAINAGE ABSCESS;  Surgeon: Leanora Cover, MD;  Location: [facility];  Service: Orthopedics;  Laterality: Bilateral;  . [place]  . LITHOTRIPSY     4x prior as of 05/2014    reports that she has never smoked. She has never used smokeless tobacco. She reports that she drinks about 4.2 oz of alcohol per week . She reports that she does not use drugs. family history includes Diabetes in her father and sister; Heart disease in her maternal grandfather; Hypertension in her father; Kidney Stones in her brother and father; Migraines in her brother. No Known Allergies    Outpatient Encounter Prescriptions as of 10/30/2016  Medication Sig  . albuterol (PROVENTIL HFA;VENTOLIN HFA) 108 (90 Base) MCG/ACT inhaler Inhale 2 puffs into the lungs every 6 (six) hours as needed for wheezing or shortness of breath.  . diclofenac (VOLTAREN) 75 MG EC tablet Take 75 mg by mouth 2 (two) times daily as needed (back pain).  Marland Kitchen esomeprazole (NEXIUM) 40 MG capsule Take 40 mg by mouth daily at 12 noon.  Marland Kitchen estradiol (VIVELLE-DOT) 0.1 MG/24HR patch Place 1 patch onto the skin 2 (two) times a week.   . fluticasone (FLOVENT HFA) 220 MCG/ACT inhaler Inhale 2 puffs into the lungs daily as needed.  Marland Kitchen LORazepam (ATIVAN) 0.5 MG tablet Take 1  tablet (0.5 mg total) by mouth 2 (two) times daily as needed for anxiety.  . methocarbamol (ROBAXIN) 500 MG tablet Take 500 mg by mouth 2 (two) times daily as needed (back pain).  . progesterone (PROMETRIUM) 200 MG capsule Take 200 mg by mouth daily.  . [DISCONTINUED] diclofenac (VOLTAREN) 75 MG EC tablet TAKE 1 TABLET BY MOUTH TWICE A DAY (Patient taking differently: TAKE 1 TABLET BY MOUTH TWICE A DAY as needed)  . [DISCONTINUED] esomeprazole (NEXIUM) 40 MG capsule Take 1 capsule (40 mg total) by mouth daily at 12 noon. (Patient taking differently: Take 40 mg by mouth daily. )  . [DISCONTINUED] fluticasone (FLOVENT HFA) 220 MCG/ACT inhaler  Inhale 2 puffs into the lungs 2 (two) times daily. (Patient taking differently: Inhale 2 puffs into the lungs 2 (two) times daily as needed (sob). )  . [DISCONTINUED] fluticasone furoate-vilanterol (BREO ELLIPTA) 200-25 MCG/INH AEPB Inhale 1 puff into the lungs daily.   Facility-Administered Encounter Medications as of 10/30/2016  Medication  . lactulose (CHRONULAC) 10 GM/15ML solution 25 g     REVIEW OF SYSTEMS  : All other systems reviewed and negative except where noted in the History of Present Illness.   PHYSICAL EXAM: BP 104/68   Pulse 72   Ht 5\' 2"  (1.575 m)   Wt 108 lb (49 kg)   BMI 19.75 kg/m  General: Well developed white female in no acute distress Head: Normocephalic and atraumatic Eyes:  Sclerae anicteric, conjunctiva pink. Ears: Normal auditory acuity Lungs: Clear throughout to auscultation; no increased WOB. Heart: Regular rate and rhythm; no M/R/G. Abdomen: Soft, non-distended.  Normal bowel sounds.  Minimal epigastric TTP. Musculoskeletal: Symmetrical with no gross deformities  Skin: No lesions on visible extremities Extremities: No edema  Neurological: Alert oriented x 4, grossly non-focal Psychological:  Alert and cooperative. Normal mood and affect  ASSESSMENT AND PLAN: *56 year old female with complaints of epigastric pain with eating for the past few weeks. She has a history of eosinophilic esophagitis and describes intermittent mild dysphagia. Has been on Nexium 40 mg daily for quite some time.  We will schedule for EGD for evaluation with Dr. Hilarie Fredrickson as patient is concerned and would like to have EGD repeated.  I suggested possibly changing her PPI, but she would like to hold off for now and await results of EGD.  ? If she has some mild esophagitis.   *IBS with bloating:  Some relief with Gas-X.  Will try IBgard.  **The risks, benefits, and alternatives to EGD were discussed with the patient and she consents to proceed.  She is skeptical about dilation at this  point, but will discuss with Dr. Hilarie Fredrickson at the time of procedure.   CC:  Tysinger, Camelia Eng, PA-C  Addendum: Reviewed and agree with initial management. Pyrtle, Lajuan Lines, MD

## 2016-10-30 NOTE — Patient Instructions (Signed)
We have provided you with IBGARD samples and a coupon  for abdominal pain, bloating.   You have been scheduled for an endoscopy. Please follow written instructions given to you at your visit today. If you use inhalers (even only as needed), please bring them with you on the day of your procedure. Your physician has requested that you go to www.startemmi.com and enter the access code given to you at your visit today. This web site gives a general overview about your procedure. However, you should still follow specific instructions given to you by our office regarding your preparation for the procedure.

## 2016-10-31 ENCOUNTER — Telehealth: Payer: Self-pay | Admitting: *Deleted

## 2016-10-31 NOTE — Telephone Encounter (Signed)
We have received a request to complete prior authorization for patient's Nexium 40 mg capsules. I have completed prior authorization through [email or web address]. Patient has tried Aciphex, omeprazole 40 mg, Dexilant (formally Kapidex) Nexium 20 mg twice daily and pepcid in the past all with inadequate results for her GERD symptoms. I am awaiting a response from insurance at this time.

## 2016-11-04 DIAGNOSIS — Z01419 Encounter for gynecological examination (general) (routine) without abnormal findings: Secondary | ICD-10-CM | POA: Diagnosis not present

## 2016-11-04 LAB — HM PAP SMEAR: HM PAP: NEGATIVE

## 2016-11-05 ENCOUNTER — Other Ambulatory Visit: Payer: Self-pay | Admitting: Obstetrics and Gynecology

## 2016-11-05 DIAGNOSIS — N63 Unspecified lump in unspecified breast: Secondary | ICD-10-CM

## 2016-11-05 LAB — HM PAP SMEAR: HM Pap smear: NEGATIVE

## 2016-11-05 MED ORDER — PANTOPRAZOLE SODIUM 40 MG PO TBEC: 40.0000 mg | DELAYED_RELEASE_TABLET | Freq: Every day | ORAL | 0 refills | Status: DC

## 2016-11-05 NOTE — Addendum Note (Signed)
Addended by: Larina Bras on: 11/05/2016 11:34 AM   Modules accepted: Orders

## 2016-11-05 NOTE — Telephone Encounter (Signed)
I have attempted prior authorization for patient's Nexium 40 mg. Unfortunately, patient's insurance has denied her prescription because they want her to have tried and failed pantoprazole first.  Patient verbalizes understanding. I will send a 1 month supply of pantoprazole to her pharmacy and she will let us know if this does not work for her.

## 2016-11-13 ENCOUNTER — Encounter: Payer: Self-pay | Admitting: Internal Medicine

## 2016-11-13 ENCOUNTER — Ambulatory Visit: Payer: BLUE CROSS/BLUE SHIELD | Admitting: Internal Medicine

## 2016-11-18 DIAGNOSIS — N760 Acute vaginitis: Secondary | ICD-10-CM | POA: Diagnosis not present

## 2016-11-19 ENCOUNTER — Other Ambulatory Visit: Payer: 59

## 2016-11-20 ENCOUNTER — Ambulatory Visit
Admission: RE | Admit: 2016-11-20 | Discharge: 2016-11-20 | Disposition: A | Discharge status: Home / Self Care | Payer: 59 | Source: Ambulatory Visit | Attending: Obstetrics and Gynecology | Admitting: Obstetrics and Gynecology

## 2016-11-20 DIAGNOSIS — N63 Unspecified lump in unspecified breast: Secondary | ICD-10-CM

## 2016-11-20 DIAGNOSIS — N6489 Other specified disorders of breast: Secondary | ICD-10-CM | POA: Diagnosis not present

## 2016-11-20 DIAGNOSIS — R922 Inconclusive mammogram: Secondary | ICD-10-CM | POA: Diagnosis not present

## 2016-11-27 ENCOUNTER — Encounter: Payer: Self-pay | Admitting: Internal Medicine

## 2016-11-27 ENCOUNTER — Ambulatory Visit (AMBULATORY_SURGERY_CENTER): Payer: 59 | Admitting: Internal Medicine

## 2016-11-27 VITALS — BP 136/52 | HR 67 | Temp 97.8°F | Resp 14 | Ht 62.0 in | Wt 108.0 lb

## 2016-11-27 DIAGNOSIS — R1319 Other dysphagia: Secondary | ICD-10-CM | POA: Diagnosis not present

## 2016-11-27 DIAGNOSIS — K200 Eosinophilic esophagitis: Secondary | ICD-10-CM | POA: Diagnosis not present

## 2016-11-27 DIAGNOSIS — R1013 Epigastric pain: Secondary | ICD-10-CM | POA: Diagnosis present

## 2016-11-27 DIAGNOSIS — K2950 Unspecified chronic gastritis without bleeding: Secondary | ICD-10-CM | POA: Diagnosis not present

## 2016-11-27 DIAGNOSIS — R140 Abdominal distension (gaseous): Secondary | ICD-10-CM

## 2016-11-27 MED ORDER — SODIUM CHLORIDE 0.9 % IV SOLN: 500.0000 mL | INTRAVENOUS | Status: DC

## 2016-11-27 NOTE — Patient Instructions (Signed)
YOU HAD AN ENDOSCOPIC PROCEDURE TODAY AT [facility]:   Refer to the procedure report that was given to you for any specific questions about what was found during the examination.  If the procedure report does not answer your questions, please call your gastroenterologist to clarify.  If you requested that your care partner not be given the details of your procedure findings, then the procedure report has been included in a sealed envelope for you to review at your convenience later.  YOU SHOULD EXPECT: Some feelings of bloating in the abdomen. Passage of more gas than usual.  Walking can help get rid of the air that was put into your GI tract during the procedure and reduce the bloating. If you had a lower endoscopy (such as a colonoscopy or flexible sigmoidoscopy) you may notice spotting of blood in your stool or on the toilet paper. If you underwent a bowel prep for your procedure, you may not have a normal bowel movement for a few days.  Please Note:  You might notice some irritation and congestion in your nose or some drainage.  This is from the oxygen used during your procedure.  There is no need for concern and it should clear up in a day or so.  SYMPTOMS TO REPORT IMMEDIATELY:      Following upper endoscopy (EGD)  Vomiting of blood or coffee ground material  New chest pain or pain under the shoulder blades  Painful or persistently difficult swallowing  New shortness of breath  Fever of 100F or higher  Black, tarry-looking stools  For urgent or emergent issues, a gastroenterologist can be reached at any hour by calling [phone].   DIET:  We do recommend a small meal at first, but then you may proceed to your regular diet.  Drink plenty of fluids but you should avoid alcoholic beverages for 24 hours.  ACTIVITY:  You should plan to take it easy for the rest of today and you should NOT DRIVE or use heavy machinery until tomorrow (because of the sedation medicines  used during the test).    FOLLOW UP: Our staff will call the number listed on your records the next business day following your procedure to check on you and address any questions or concerns that you may have regarding the information given to you following your procedure. If we do not reach you, we will leave a message.  However, if you are feeling well and you are not experiencing any problems, there is no need to return our call.  We will assume that you have returned to your regular daily activities without incident.  If any biopsies were taken you will be contacted by phone or by letter within the next 1-3 weeks.  Please call us at [phone] if you have not heard about the biopsies in 3 weeks.    SIGNATURES/CONFIDENTIALITY: You and/or your care partner have signed paperwork which will be entered into your electronic medical record.  These signatures attest to the fact that that the information above on your After Visit Summary has been reviewed and is understood.  Full responsibility of the confidentiality of this discharge information lies with you and/or your care-partner.  Await pathology results.  Next available appointment with Dr. Hilarie Fredrickson.

## 2016-11-27 NOTE — Progress Notes (Signed)
Called to room to assist during endoscopic procedure.  Patient ID and intended procedure confirmed with present staff. Received instructions for my participation in the procedure from the performing physician.  

## 2016-11-27 NOTE — Progress Notes (Signed)
To recovery, report to RN, VSS. 

## 2016-11-27 NOTE — Op Note (Signed)
[facility] Patient Name: [name] Procedure Date: 11/27/2016 9:54 AM MRN: [id number] Endoscopist: Jerene Bears , MD Age: 56 Referring MD:  Date of Birth: 08-20-1960 Gender: Female Account #: [id number] Procedure:                Upper GI endoscopy Indications:              Epigastric abdominal pain, Dysphagia, Eosinophilic                            esophagitis, Abdominal bloating, history of SIBO Medicines:                Monitored Anesthesia Care Procedure:                Pre-Anesthesia Assessment:                           - Prior to the procedure, a History and Physical                            was performed, and patient medications and                            allergies were reviewed. The patient's tolerance of                            previous anesthesia was also reviewed. The risks                            and benefits of the procedure and the sedation                            options and risks were discussed with the patient.                            All questions were answered, and informed consent                            was obtained. Prior Anticoagulants: The patient has                            taken no previous anticoagulant or antiplatelet                            agents. ASA Grade Assessment: II - A patient with                            mild systemic disease. After reviewing the risks                            and benefits, the patient was deemed in                            satisfactory condition to undergo the procedure.  After obtaining informed consent, the endoscope was                            passed under direct vision. Throughout the                            procedure, the patient's blood pressure, pulse, and                            oxygen saturations were monitored continuously. The                            Endoscope was introduced through the mouth, and                            advanced  to the second part of duodenum. The upper                            GI endoscopy was accomplished without difficulty.                            The patient tolerated the procedure well. Scope In: Scope Out: Findings:                 The examined esophagus was normal. Z-line is                            regular at 38 cm. There was no evidence of EoE or                            reflux esophagitis. No esophageal strictures were                            seen. Biopsies were obtained from the proximal and                            distal esophagus with cold forceps for histology                            given history of eosinophilic esophagitis.                           The entire examined stomach was normal. Biopsies                            were taken with a cold forceps for histology and                            Helicobacter pylori testing.                           The examined duodenum was normal. Biopsies for  histology were taken with a cold forceps for                            evaluation of celiac disease. Complications:            No immediate complications. Estimated Blood Loss:     Estimated blood loss was minimal. Impression:               - Normal esophagus. Biopsied.                           - Normal stomach. Biopsied.                           - Normal examined duodenum. Biopsied. Recommendation:           - Patient has a contact number available for                            emergencies. The signs and symptoms of potential                            delayed complications were discussed with the                            patient. Return to normal activities tomorrow.                            Written discharge instructions were provided to the                            patient.                           - Resume previous diet.                           - Continue present medications.                           - Await pathology  results.                           - If recurrent epigastric abdominal pain, I                            recommend abdominal US to rule out gallstones.                           - If bloating continues to be a problem, repeat                            treatment for SIBO (with a medication other than                            rifaximin) can be tried. Jerene Bears, MD 11/27/2016 10:22:23 AM This report has been signed electronically.

## 2016-11-28 ENCOUNTER — Telehealth: Payer: Self-pay | Admitting: Gastroenterology

## 2016-11-28 ENCOUNTER — Telehealth: Payer: Self-pay

## 2016-11-28 NOTE — Telephone Encounter (Signed)
  Follow up Call-  Call back number 11/27/2016  Post procedure Call Back phone  # [phone]  Permission to leave phone message Yes  Some recent data might be hidden     Patient questions:  Do you have a fever, pain , or abdominal swelling? No. Pain Score  0 *  Have you tolerated food without any problems? Yes.    Have you been able to return to your normal activities? No.  Do you have any questions about your discharge instructions: Diet   No. Medications  No. Follow up visit  No.  Do you have questions or concerns about your Care? No.  Actions: * If pain score is 4 or above: No action needed, pain <4. Patient is not having pain but felt bad last night. She said she had a pressure feeling that may have been gas, and a sore throat but feels better this morning. She is tolerating soft foods and thinks today will be a better day.

## 2016-11-28 NOTE — Telephone Encounter (Signed)
Patient called complaining the she has some discomfort with swallowing and feels when she swallows food. Advised patient to eat only soft foods for next 1-2 days and call back with any change in symptoms. She may have some sensitivity at site of esophageal biopsies. Denies any nausea, vomiting, abdominal pain, melena or bright red blood per rectum  Raliegh Ip Denzil Magnuson , MD [phone] Mon-Fri 8a-5p [phone] after 5p, weekends, holidays

## 2016-12-02 ENCOUNTER — Telehealth: Payer: Self-pay | Admitting: Internal Medicine

## 2016-12-02 ENCOUNTER — Other Ambulatory Visit: Payer: Self-pay | Admitting: Internal Medicine

## 2016-12-02 ENCOUNTER — Encounter: Payer: Self-pay | Admitting: Internal Medicine

## 2016-12-02 NOTE — Telephone Encounter (Signed)
Ok to refill 

## 2016-12-03 NOTE — Telephone Encounter (Signed)
This was sent to pharmacy yesterday.

## 2016-12-12 ENCOUNTER — Encounter: Payer: Self-pay | Admitting: Medical

## 2016-12-12 ENCOUNTER — Ambulatory Visit (INDEPENDENT_AMBULATORY_CARE_PROVIDER_SITE_OTHER): Payer: 59 | Admitting: Medical

## 2016-12-12 VITALS — BP 108/64 | HR 66 | Wt 109.4 lb

## 2016-12-12 DIAGNOSIS — F419 Anxiety disorder, unspecified: Secondary | ICD-10-CM | POA: Diagnosis not present

## 2016-12-12 DIAGNOSIS — J4540 Moderate persistent asthma, uncomplicated: Secondary | ICD-10-CM | POA: Diagnosis not present

## 2016-12-12 MED ORDER — FLUTICASONE PROPIONATE HFA 220 MCG/ACT IN AERO: 2.0000 | INHALATION_SPRAY | Freq: Every day | RESPIRATORY_TRACT | 5 refills | Status: DC | PRN

## 2016-12-12 MED ORDER — LORAZEPAM 0.5 MG PO TABS: 0.5000 mg | ORAL_TABLET | Freq: Two times a day (BID) | ORAL | 0 refills | Status: DC | PRN

## 2016-12-12 MED ORDER — PAROXETINE HCL 20 MG PO TABS: 20.0000 mg | ORAL_TABLET | Freq: Every day | ORAL | 2 refills | Status: DC

## 2016-12-12 NOTE — Progress Notes (Signed)
Subjective: Chief Complaint  Patient presents with  . Anxiety    anxiety   Here for c/o anxiety.  Sees Dr. Seward Grater at [facility] for counseling.  Lately seems to be worried about a lot of stuff.  Has had some recent chest pain, has had esophageal problems, had recent EGD.  No major found, so then she was worried about her heart.    With her conversations with Mr. Geoffry Paradise, has worked on breathing techniques, using positive words of affirmation.  Advised to use words of affirmation and positive thoughts when she gets these feeling.   Still has bottle of Ativan from last year that we prescribed here.   Takes ativan more of late, but tries to not use it regularly.  Her psychologist recommended she go on Celexa or Zoloft.   Years ago took Paxil, thinks it did ok.  Used if for a few months she thinks.   Feels content in her present state of life.  No particular stressors.   Asthma - not currently taking Flovent, not breathing as good as she could be.  Uses albuterol occasionally.   Past Medical History:  Diagnosis Date  . Allergy   . Anxiety   . Asthma   . Chronic back pain   . Eosinophilic esophagitis   . Esophageal spasm   . Esophageal stricture   . GERD (gastroesophageal reflux disease)   . Headache    occasional  . IBS (irritable bowel syndrome)   . Kidney stone 2015   gets every few years, hx/o calcium oxalate stones  . Osteopenia   . Scoliosis   . Seasonal allergic conjunctivitis   . Seasonal allergic rhinitis   . Small intestinal bacterial overgrowth   . Vitamin D deficiency   . Wears glasses    Current Outpatient Prescriptions on File Prior to Visit  Medication Sig Dispense Refill  . albuterol (PROVENTIL HFA;VENTOLIN HFA) 108 (90 Base) MCG/ACT inhaler Inhale 2 puffs into the lungs every 6 (six) hours as needed for wheezing or shortness of breath. 1 Inhaler 1  . estradiol (VIVELLE-DOT) 0.1 MG/24HR patch Place 1 patch onto the skin 2 (two) times a week.   1  .  progesterone (PROMETRIUM) 200 MG capsule Take 200 mg by mouth daily.    . diclofenac (VOLTAREN) 75 MG EC tablet Take 75 mg by mouth 2 (two) times daily as needed (back pain).    . methocarbamol (ROBAXIN) 500 MG tablet Take 500 mg by mouth 2 (two) times daily as needed (back pain).    . pantoprazole (PROTONIX) 40 MG tablet TAKE 1 TABLET BY MOUTH EVERY DAY (Patient not taking: Reported on 12/12/2016) 30 tablet 1   Current Facility-Administered Medications on File Prior to Visit  Medication Dose Route Frequency Provider Last Rate Last Dose  . 0.9 %  sodium chloride infusion  500 mL Intravenous Continuous Pyrtle, Lajuan Lines, MD      . lactulose (CHRONULAC) 10 GM/15ML solution 25 g  25 g Oral Once Levin Erp, PA       ROS as in subjective   Objective: BP 108/64   Pulse 66   Wt 109 lb 6.4 oz (49.6 kg)   SpO2 97%   BMI 20.01 kg/m   General appearance: alert, no distress, WD/WN,  HEENT: normocephalic, sclerae anicteric, TMs pearly, nares patent, no discharge or erythema, pharynx with some mucous drainage Oral cavity: MMM, no lesions Neck: supple, no lymphadenopathy, no thyromegaly, no masses Psych: pleasant, answers questions appropriately  Assessment: Encounter Diagnoses  Name Primary?  Marland Kitchen Anxiety Yes  . Moderate persistent asthma without complication     Plan: Begin trial of Paxil, discussed risks/benefits, c/t ativan for rare moment of panic feeling.   Discussed coping skills, dealing with anxiety.  C/t counseling.  F/u 3-4 wk.   Asthma - restart Flovent preventative inhaler, c/t albuterol prn, discussed proper use and role of each inhaler.    Charnika was seen today for anxiety.  Diagnoses and all orders for this visit:  Anxiety  Moderate persistent asthma without complication  Other orders -     fluticasone (FLOVENT HFA) 220 MCG/ACT inhaler; Inhale 2 puffs into the lungs daily as needed. -     PARoxetine (PAXIL) 20 MG tablet; Take 1 tablet (20 mg total) by mouth  daily. -     LORazepam (ATIVAN) 0.5 MG tablet; Take 1 tablet (0.5 mg total) by mouth 2 (two) times daily as needed for anxiety.

## 2016-12-23 DIAGNOSIS — N760 Acute vaginitis: Secondary | ICD-10-CM | POA: Diagnosis not present

## 2016-12-25 ENCOUNTER — Ambulatory Visit: Payer: 59 | Admitting: Medical

## 2017-01-14 ENCOUNTER — Ambulatory Visit (INDEPENDENT_AMBULATORY_CARE_PROVIDER_SITE_OTHER): Payer: 59 | Admitting: Medical

## 2017-01-14 VITALS — BP 110/72 | HR 63 | Temp 97.8°F | Wt 110.2 lb

## 2017-01-14 DIAGNOSIS — R300 Dysuria: Secondary | ICD-10-CM | POA: Diagnosis not present

## 2017-01-14 DIAGNOSIS — Z87442 Personal history of urinary calculi: Secondary | ICD-10-CM | POA: Diagnosis not present

## 2017-01-14 DIAGNOSIS — R350 Frequency of micturition: Secondary | ICD-10-CM | POA: Diagnosis not present

## 2017-01-14 LAB — POCT URINALYSIS DIP (PROADVANTAGE DEVICE)
Bilirubin, UA: NEGATIVE
Glucose, UA: NEGATIVE mg/dL
Ketones, POC UA: NEGATIVE mg/dL
Nitrite, UA: NEGATIVE
PH UA: 6.5 (ref 5.0–8.0)
PROTEIN UA: NEGATIVE mg/dL
SPECIFIC GRAVITY, URINE: 1.025
UUROB: NEGATIVE

## 2017-01-14 NOTE — Progress Notes (Signed)
Subjective: Chief Complaint  Patient presents with  . possible uti    burning, freq. pain with urination    Here for possible UTI.  She notes 2 day hx/o lower abdominal discomfort, urine urgency, not much coming out, uncomfortable, worse in mornings, improves as day goes on.  +urinary frequency.  Last UTI been a while.  No fever, no back, some nausea, no vomiting.  Sees GI for chronic constipation.  No blood in urine, urine not cloudy, no odor.  She does have hx/o kidney stones, and has had several stones in the pas.   She is usually able to pass them and this currently feels a little like a stone.   She typically gets a little back pain, but usually not severe pain.   She has been went to the ED 4 times in the past for stone, but has passed probably 8 stones with lesser pain.     She has recurrent vaginitis, sees gyn, currently on Metrogel for recurrent BV.    Gets yeast infections as well, but none in a while.     Past Medical History:  Diagnosis Date  . Allergy   . Anxiety   . Asthma   . Chronic back pain   . Eosinophilic esophagitis   . Esophageal spasm   . Esophageal stricture   . GERD (gastroesophageal reflux disease)   . Headache    occasional  . IBS (irritable bowel syndrome)   . Kidney stone 2015   gets every few years, hx/o calcium oxalate stones  . Osteopenia   . Scoliosis   . Seasonal allergic conjunctivitis   . Seasonal allergic rhinitis   . Small intestinal bacterial overgrowth   . Vitamin D deficiency   . Wears glasses    Current Outpatient Prescriptions on File Prior to Visit  Medication Sig Dispense Refill  . albuterol (PROVENTIL HFA;VENTOLIN HFA) 108 (90 Base) MCG/ACT inhaler Inhale 2 puffs into the lungs every 6 (six) hours as needed for wheezing or shortness of breath. 1 Inhaler 1  . diclofenac (VOLTAREN) 75 MG EC tablet Take 75 mg by mouth 2 (two) times daily as needed (back pain).    Marland Kitchen estradiol (VIVELLE-DOT) 0.1 MG/24HR patch Place 1 patch onto the skin 2  (two) times a week.   1  . fluticasone (FLOVENT HFA) 220 MCG/ACT inhaler Inhale 2 puffs into the lungs daily as needed. 1 Inhaler 5  . LORazepam (ATIVAN) 0.5 MG tablet Take 1 tablet (0.5 mg total) by mouth 2 (two) times daily as needed for anxiety. 60 tablet 0  . methocarbamol (ROBAXIN) 500 MG tablet Take 500 mg by mouth 2 (two) times daily as needed (back pain).    . pantoprazole (PROTONIX) 40 MG tablet TAKE 1 TABLET BY MOUTH EVERY DAY 30 tablet 1  . progesterone (PROMETRIUM) 200 MG capsule Take 200 mg by mouth daily.     Current Facility-Administered Medications on File Prior to Visit  Medication Dose Route Frequency Provider Last Rate Last Dose  . 0.9 %  sodium chloride infusion  500 mL Intravenous Continuous Pyrtle, Lajuan Lines, MD      . lactulose (CHRONULAC) 10 GM/15ML solution 25 g  25 g Oral Once Levin Erp, PA       ROS as in subjective    Objective: BP 110/72   Pulse 63   Temp 97.8 F (36.6 C)   Wt 110 lb 3.2 oz (50 kg)   SpO2 98%   BMI 20.16 kg/m  General appearance: alert, no distress, WD/WN,  Back: nontender Abdomen: +bs, soft, non tender, non distended, no masses, no hepatomegaly, no splenomegaly Pulses: 2+ symmetric, upper and lower extremities, normal cap refill GU deferred    Assessment: Encounter Diagnoses  Name Primary?  . Urine frequency Yes  . Dysuria   . History of renal calculi     Plan: Will send urine for culture.  Hydrate well, watch for stones to pass, and she has pain medication at home if needed from prior episode.   She declines Flomax.   If worse, recheck or go to the ED if severe pain.  Ragina was seen today for possible uti.  Diagnoses and all orders for this visit:  Urine frequency -     POCT Urinalysis DIP (Proadvantage Device) -     Urine Culture  Dysuria -     Urine Culture  History of renal calculi -     Urine Culture   `

## 2017-01-15 ENCOUNTER — Encounter: Payer: Self-pay | Admitting: *Deleted

## 2017-01-16 ENCOUNTER — Other Ambulatory Visit: Payer: Self-pay | Admitting: Medical

## 2017-01-16 MED ORDER — NITROFURANTOIN MONOHYD MACRO 100 MG PO CAPS: 100.0000 mg | ORAL_CAPSULE | Freq: Two times a day (BID) | ORAL | 0 refills | Status: AC

## 2017-01-17 LAB — URINE CULTURE
MICRO NUMBER:: 81190901
SPECIMEN QUALITY: ADEQUATE

## 2017-02-02 ENCOUNTER — Encounter: Payer: Self-pay | Admitting: Internal Medicine

## 2017-02-02 ENCOUNTER — Ambulatory Visit: Payer: 59 | Admitting: Internal Medicine

## 2017-02-02 VITALS — BP 110/80 | HR 78 | Ht 62.0 in | Wt 110.4 lb

## 2017-02-02 DIAGNOSIS — K219 Gastro-esophageal reflux disease without esophagitis: Secondary | ICD-10-CM

## 2017-02-02 DIAGNOSIS — R140 Abdominal distension (gaseous): Secondary | ICD-10-CM

## 2017-02-02 DIAGNOSIS — K6389 Other specified diseases of intestine: Secondary | ICD-10-CM

## 2017-02-02 MED ORDER — AMOXICILLIN-POT CLAVULANATE 875-125 MG PO TABS: 1.0000 | ORAL_TABLET | Freq: Two times a day (BID) | ORAL | 0 refills | Status: DC

## 2017-02-02 NOTE — Patient Instructions (Addendum)
We have sent the following medications to your pharmacy for you to pick up at your convenience: Augmentin 875 mg twice daily x 10 days  Call our office after you have taken your Augmentin course.  Continue Pantoprazole 40 mg daily   Please purchase the following medications over the counter and take as directed: Beano or simethicone  If you are age 55 or older, your body mass index should be between 23-30. Your Body mass index is 20.19 kg/m. If this is out of the aforementioned range listed, please consider follow up with your Primary Care Provider.  If you are age 106 or younger, your body mass index should be between 19-25. Your Body mass index is 20.19 kg/m. If this is out of the aformentioned range listed, please consider follow up with your Primary Care Provider.

## 2017-02-02 NOTE — Progress Notes (Signed)
Subjective:    Patient ID: [name], female    DOB: 1960/10/15, 56 y.o.   MRN: [id number]  HPI Bailyn Spackman is a 56 year old female with a past medical history of GERD, EoE, IBS and SIBO who returns for follow-up.  She was last seen in the office on 10/30/2016 by Alonza Bogus, PA-C.  She had upper endoscopy to evaluate dysphagia, epigastric abdominal pain, abdominal bloating and her history of EoE.  This showed a normal esophagus.  There were no strictures seen.  Biopsies were performed of the proximal and distal esophagus for EoE.  The stomach and duodenum were normal appearing.  Esophagus biopsy showed benign squamous mucosa with no increase in intraepithelial eosinophils.  In the stomach there was mild chronic gastritis without H. pylori.  Duodenal biopsies were normal.  She has been maintained on pantoprazole 40 mg once daily.  She reports her GERD symptoms have been under good control recently.  Her epigastric pain has not been an issue  She denies recent issues with dysphagia.  No odynophagia.  Her biggest issue continues to be abdominal bloating.  At times this can be dramatic and cause her to be uncomfortable in her bilateral upper abdomen.  She has been using VSL 3 as a probiotic.  She is taking fiber supplementation daily.  She occasionally uses Beano and simethicone.  She does not feel constipated and is having a bowel movement once per day.  The fiber and VSL 3 have helped this.  Even with more regular bowel movements she still feels the abdominal bloating.  She was treated with rifaximin for 14 days for SIBO after positive breath test in February 2018.  This medication made her have dizziness and reportedly felt "off in my head".  She does not recall that it helped much.   Review of Systems As per HPI, otherwise negative  Current Medications, Allergies, Past Medical History, Past Surgical History, Family History and Social History were reviewed in [employer]  record.     Objective:   Physical Exam BP 110/80   Pulse 78   Ht 5\' 2"  (1.575 m)   Wt 110 lb 6.4 oz (50.1 kg)   SpO2 99%   BMI 20.19 kg/m  Constitutional: Well-developed and well-nourished. No distress. HEENT: Normocephalic and atraumatic. Oropharynx is clear and moist. Conjunctivae are normal.  No scleral icterus. Neck: Neck supple. Trachea midline. Cardiovascular: Normal rate, regular rhythm and intact distal pulses. No M/R/G Pulmonary/chest: Effort normal and breath sounds normal. No wheezing, rales or rhonchi. Abdominal: Soft, nontender, nondistended. Bowel sounds active throughout. There are no masses palpable. No hepatosplenomegaly. Extremities: no clubbing, cyanosis, or edema Neurological: Alert and oriented to person place and time. Skin: Skin is warm and dry. Psychiatric: Normal mood and affect. Behavior is normal.      Assessment & Plan:  56 year old female with a past medical history of GERD, EoE, IBS and SIBO who returns for follow-up.  1. SIBO --ongoing abdominal bloating which after evaluation is most likely bacterial overgrowth related.  I am going to try a different antibiotic course for her to see if she benefits.  I have asked that she keep a journal both food and symptom to see if antibiotics truly help.  Will treat with Augmentin 875 twice daily times 10 days.  I asked that she notify me after antibiotic therapy to see if the bloating is definitively better.  She can still use Beano and simethicone per box instructions on an as-needed basis.  2.  GERD/history of EoE --EoE in remission by biopsy.  Reflux symptoms well controlled currently with pantoprazole.  She will continue pantoprazole 40 mg daily.  3.  Colon cancer screening --normal colonoscopy in 2010, repeat for screening in 2020  She will continue fiber and VSL 3  Annual follow-up, sooner if needed and we will wait to hear how she responds to antibiotics 25 minutes spent with the patient today. Greater  than 50% was spent in counseling and coordination of care with the patient

## 2017-02-08 ENCOUNTER — Other Ambulatory Visit: Payer: Self-pay | Admitting: Internal Medicine

## 2017-02-11 ENCOUNTER — Emergency Department (HOSPITAL_COMMUNITY)
Admission: EM | Admit: 2017-02-11 | Discharge: 2017-02-12 | Disposition: A | Discharge status: Home / Self Care | Payer: 59 | Attending: Emergency Medicine | Admitting: Emergency Medicine

## 2017-02-11 ENCOUNTER — Other Ambulatory Visit: Payer: Self-pay

## 2017-02-11 ENCOUNTER — Encounter (HOSPITAL_COMMUNITY): Payer: Self-pay

## 2017-02-11 ENCOUNTER — Emergency Department (HOSPITAL_COMMUNITY): Payer: 59

## 2017-02-11 DIAGNOSIS — J4530 Mild persistent asthma, uncomplicated: Secondary | ICD-10-CM | POA: Insufficient documentation

## 2017-02-11 DIAGNOSIS — R1111 Vomiting without nausea: Secondary | ICD-10-CM | POA: Diagnosis not present

## 2017-02-11 DIAGNOSIS — R109 Unspecified abdominal pain: Secondary | ICD-10-CM | POA: Diagnosis present

## 2017-02-11 DIAGNOSIS — R319 Hematuria, unspecified: Secondary | ICD-10-CM | POA: Diagnosis not present

## 2017-02-11 DIAGNOSIS — N201 Calculus of ureter: Secondary | ICD-10-CM | POA: Insufficient documentation

## 2017-02-11 DIAGNOSIS — Z79899 Other long term (current) drug therapy: Secondary | ICD-10-CM | POA: Diagnosis not present

## 2017-02-11 DIAGNOSIS — R102 Pelvic and perineal pain: Secondary | ICD-10-CM | POA: Diagnosis not present

## 2017-02-11 LAB — URINALYSIS, ROUTINE W REFLEX MICROSCOPIC
BILIRUBIN URINE: NEGATIVE
Glucose, UA: NEGATIVE mg/dL
KETONES UR: 5 mg/dL — AB
LEUKOCYTES UA: NEGATIVE
NITRITE: NEGATIVE
PROTEIN: 100 mg/dL — AB
Specific Gravity, Urine: 1.02 (ref 1.005–1.030)
pH: 6 (ref 5.0–8.0)

## 2017-02-11 LAB — CBC WITH DIFFERENTIAL/PLATELET
BASOS ABS: 0.1 10*3/uL (ref 0.0–0.1)
Basophils Relative: 1 %
EOS PCT: 5 %
Eosinophils Absolute: 0.4 10*3/uL (ref 0.0–0.7)
HEMATOCRIT: 35.3 % — AB (ref 36.0–46.0)
HEMOGLOBIN: 12.4 g/dL (ref 12.0–15.0)
LYMPHS ABS: 2.4 10*3/uL (ref 0.7–4.0)
LYMPHS PCT: 27 %
MCH: 32.7 pg (ref 26.0–34.0)
MCHC: 35.1 g/dL (ref 30.0–36.0)
MCV: 93.1 fL (ref 78.0–100.0)
Monocytes Absolute: 0.5 10*3/uL (ref 0.1–1.0)
Monocytes Relative: 6 %
NEUTROS ABS: 5.7 10*3/uL (ref 1.7–7.7)
NEUTROS PCT: 61 %
Platelets: 288 10*3/uL (ref 150–400)
RBC: 3.79 MIL/uL — AB (ref 3.87–5.11)
RDW: 11.8 % (ref 11.5–15.5)
WBC: 9.1 10*3/uL (ref 4.0–10.5)

## 2017-02-11 LAB — BASIC METABOLIC PANEL
ANION GAP: 9 (ref 5–15)
BUN: 12 mg/dL (ref 6–20)
CALCIUM: 9.5 mg/dL (ref 8.9–10.3)
CO2: 27 mmol/L (ref 22–32)
CREATININE: 0.76 mg/dL (ref 0.44–1.00)
Chloride: 101 mmol/L (ref 101–111)
GFR calc non Af Amer: >60 mL/min (ref >60)
Glucose, Bld: 96 mg/dL (ref 65–99)
Potassium: 3.4 mmol/L — ABNORMAL LOW (ref 3.5–5.1)
SODIUM: 137 mmol/L (ref 135–145)

## 2017-02-11 LAB — I-STAT BETA HCG BLOOD, ED (MC, WL, AP ONLY)

## 2017-02-11 NOTE — ED Triage Notes (Signed)
Pt states that about 30 minutes ago, began to have R sided flank pain. Denies n/v/fevers. Hx of stones

## 2017-02-11 NOTE — ED Provider Notes (Signed)
[facility] EMERGENCY DEPARTMENT Provider Note   CSN: [id number] Arrival date & time: 02/11/17  2124     History   Chief Complaint Chief Complaint  Patient presents with  . Flank Pain    HPI [name] is a 56 y.o. female.  The history is provided by the patient and medical records.  Flank Pain      56 year old female with history of anxiety, allergies, chronic back pain, GERD, IBS, scoliosis, vitamin D deficiency, osteopenia, kidney stones, presenting to the ED with right flank pain.  Reports this began about 30 minutes prior to arrival while she was eating dinner.  States pain is sharp, stabbing to her right flank.  Seems to be coming in waves, has actually calmed down at the time of evaluation.  She reports some mild nausea but denies vomiting.  No fever or chills.  Has noticed some hematuria but denies dysuria.  Does have a history of a few dozen kidney stones in the past.  Has required basket retrieval and lithotripsy in the past due to the size of stones.  She is followed by Dr. Junious Silk with [facility].  Denies any current symptoms at time of my evaluation.  Past Medical History:  Diagnosis Date  . Allergy   . Anxiety   . Asthma   . Chronic back pain   . Eosinophilic esophagitis   . Esophageal spasm   . Esophageal stricture   . Gastritis   . GERD (gastroesophageal reflux disease)   . Headache    occasional  . IBS (irritable bowel syndrome)   . Kidney stone 2015   gets every few years, hx/o calcium oxalate stones  . Osteopenia   . Scoliosis   . Seasonal allergic conjunctivitis   . Seasonal allergic rhinitis   . Small intestinal bacterial overgrowth   . Vitamin D deficiency   . Wears glasses     Patient Active Problem List   Diagnosis Date Noted  . Anxiety 12/12/2016  . Abdominal pain, epigastric 10/30/2016  . Other dysphagia 10/30/2016  . Bloating 10/30/2016  . Vaccine counseling 10/21/2016  . Small intestinal bacterial  overgrowth 04/24/2016  . Chronic right-sided low back pain with right-sided sciatica 02/07/2016  . Radicular pain of right lower extremity 02/07/2016  . Throat discomfort 11/20/2015  . Rhinitis, allergic 11/20/2015  . Microscopic hematuria 11/20/2015  . Asthma, moderate persistent 11/20/2015  . History of renal calculi 11/20/2015  . Routine general medical examination at a health care facility 10/16/2015  . Gastroesophageal reflux disease without esophagitis 10/16/2015  . IBS (irritable bowel syndrome) 10/16/2015  . Cephalalgia 10/16/2015  . Idiopathic scoliosis 10/16/2015  . Chronic back pain 10/16/2015  . Asthma, mild persistent 10/16/2015  . Vitamin D deficiency 10/16/2015  . Osteopenia 10/16/2015  . SOB (shortness of breath) 10/16/2015    Past Surgical History:  Procedure Laterality Date  . APPENDECTOMY    . BREAST EXCISIONAL BIOPSY     left  . COLONOSCOPY  2013   [place],   . ESOPHAGEAL DILATION     several times prior, [place],   . ESOPHAGOPLASTY  06/4032   complication of esophageal dilitation, hospitalization at [facility] Bilateral 09/14/2015   Procedure: INCISION AND DRAINAGE ABSCESS;  Surgeon: Leanora Cover, MD;  Location: [facility];  Service: Orthopedics;  Laterality: Bilateral;  . [place]  . LITHOTRIPSY     4x prior as of 05/2014  OB History    No data available       Home Medications    Prior to Admission medications   Medication Sig Start Date End Date Taking? Authorizing Provider  albuterol (PROVENTIL HFA;VENTOLIN HFA) 108 (90 Base) MCG/ACT inhaler Inhale 2 puffs into the lungs every 6 (six) hours as needed for wheezing or shortness of breath. 10/21/16   Tysinger, Camelia Eng, PA-C  amoxicillin-clavulanate (AUGMENTIN) 875-125 MG tablet Take 1 tablet 2 (two) times daily by mouth. 02/02/17   Pyrtle, Lajuan Lines, MD  diclofenac (VOLTAREN) 75 MG EC tablet Take 75 mg by mouth 2 (two) times  daily as needed (back pain).    [provider]  estradiol (VIVELLE-DOT) 0.1 MG/24HR patch Place 1 patch onto the skin 2 (two) times a week.  08/31/15   [provider]  fluticasone (FLOVENT HFA) 220 MCG/ACT inhaler Inhale 2 puffs into the lungs daily as needed. 12/12/16   Tysinger, Camelia Eng, PA-C  LORazepam (ATIVAN) 0.5 MG tablet Take 1 tablet (0.5 mg total) by mouth 2 (two) times daily as needed for anxiety. 12/12/16   Tysinger, Camelia Eng, PA-C  methocarbamol (ROBAXIN) 500 MG tablet Take 500 mg by mouth 2 (two) times daily as needed (back pain).    [provider]  pantoprazole (PROTONIX) 40 MG tablet TAKE 1 TABLET BY MOUTH EVERY DAY 02/09/17   Pyrtle, Lajuan Lines, MD  progesterone (PROMETRIUM) 200 MG capsule Take 200 mg by mouth daily.    [provider]    Family History Family History  Problem Relation Age of Onset  . Diabetes Father   . Kidney Stones Father   . Hypertension Father   . Diabetes Sister   . Kidney Stones Brother   . Migraines Brother   . Heart disease Maternal Grandfather   . Cancer Neg Hx   . Stroke Neg Hx   . Colon cancer Neg Hx   . Esophageal cancer Neg Hx   . Stomach cancer Neg Hx     Social History Social History   Tobacco Use  . Smoking status: Never Smoker  . Smokeless tobacco: Never Used  Substance Use Topics  . Alcohol use: Yes    Alcohol/week: 4.2 oz    Types: 7 Glasses of wine per week  . Drug use: No     Allergies   Patient has no known allergies.   Review of Systems Review of Systems  Genitourinary: Positive for flank pain and hematuria.  All other systems reviewed and are negative.    Physical Exam Updated Vital Signs BP 135/72   Pulse (!) 55   Temp (!) 97.5 F (36.4 C)   Resp 18   Ht 5\' 2"  (1.575 m)   Wt 48.5 kg (107 lb)   SpO2 100%   BMI 19.57 kg/m   Physical Exam  Constitutional: She is oriented to person, place, and time. She appears well-developed and well-nourished.  HENT:  Head:  Normocephalic and atraumatic.  Mouth/Throat: Oropharynx is clear and moist.  Eyes: Conjunctivae and EOM are normal. Pupils are equal, round, and reactive to light.  Neck: Normal range of motion.  Cardiovascular: Normal rate, regular rhythm and normal heart sounds.  Pulmonary/Chest: Effort normal and breath sounds normal.  Abdominal: Soft. Bowel sounds are normal. There is no tenderness. There is no CVA tenderness.  No abdominal or CVA tenderness on exam  Musculoskeletal: Normal range of motion.  Neurological: She is alert and oriented to person, place, and time.  Skin: Skin is  warm and dry.  Psychiatric: She has a normal mood and affect.  Nursing note and vitals reviewed.    ED Treatments / Results  Labs (all labs ordered are listed, but only abnormal results are displayed) Labs Reviewed  URINALYSIS, ROUTINE W REFLEX MICROSCOPIC - Abnormal; Notable for the following components:      Result Value   APPearance HAZY (*)    Hgb urine dipstick LARGE (*)    Ketones, ur 5 (*)    Protein, ur 100 (*)    Bacteria, UA RARE (*)    Squamous Epithelial / LPF 0-5 (*)    All other components within normal limits  CBC WITH DIFFERENTIAL/PLATELET - Abnormal; Notable for the following components:   RBC 3.79 (*)    HCT 35.3 (*)    All other components within normal limits  BASIC METABOLIC PANEL - Abnormal; Notable for the following components:   Potassium 3.4 (*)    All other components within normal limits  I-STAT BETA HCG BLOOD, ED ([facility], [facility], [facility] ONLY)    EKG  EKG Interpretation None       Radiology Ct Renal Stone Study  Result Date: 02/11/2017 CLINICAL DATA:  Right flank pain beginning 30 minutes ago. Nausea and vomiting. History of kidney stones. EXAM: CT ABDOMEN AND PELVIS WITHOUT CONTRAST TECHNIQUE: Multidetector CT imaging of the abdomen and pelvis was performed following the standard protocol without IV contrast. COMPARISON:  01/08/2016 FINDINGS: Lower chest: The lung bases are  clear. Hepatobiliary: No focal liver abnormality is seen. No gallstones, gallbladder wall thickening, or biliary dilatation. Pancreas: Unremarkable. No pancreatic ductal dilatation or surrounding inflammatory changes. Spleen: Normal in size without focal abnormality. Adrenals/Urinary Tract: No adrenal gland nodules. Multiple intrarenal stones on the right and a single intrarenal stone on the left. Largest stone on the right upper pole measures 4.5 mm in diameter. No hydronephrosis or hydroureter. Calcification in the right pelvis measuring 3 mm diameter. This is new since the previous study in appears to be within the ureter. This is likely an nonobstructing ureteral stone. Bladder wall is not thickened and no bladder stones are identified. Stomach/Bowel: Stomach is within normal limits. Appendix is surgically absent. No evidence of bowel wall thickening, distention, or inflammatory changes. Vascular/Lymphatic: No significant vascular findings are present. No enlarged abdominal or pelvic lymph nodes. Reproductive: Uterus is retroverted. Uterus and ovaries are not enlarged. Other: No abdominal wall hernia or abnormality. No abdominopelvic ascites. Musculoskeletal: Lumbar scoliosis convex towards the right. Mild degenerative changes. No destructive bone lesions. IMPRESSION: 1. 3 mm stone in the distal right ureter without proximal obstruction. 2. Multiple bilateral nonobstructing intrarenal stones. Electronically Signed   By: Lucienne Capers M.D.   On: 02/11/2017 23:44    Procedures Procedures (including critical care time)  Medications Ordered in ED Medications - No data to display   Initial Impression / Assessment and Plan / ED Course  I have reviewed the triage vital signs and the nursing notes.  Pertinent labs & imaging results that were available during my care of the patient were reviewed by me and considered in my medical decision making (see chart for details).  56 year old female here with  right-sided flank pain that began about 30 minutes prior to arrival.  Pain is sharp, stabbing in nature but coming in waves.  No pain on my initial assessment.  Abdomen is soft and benign.  No focal CVA tenderness.  Does have significant history of stone disease.  UA with blood noted but no signs  of infection.  Screening lab work overall reassuring.  CT study here with 3 mm stone in the distal right ureter without noted obstruction.  Results discussed with patient, she has remained comfortable here and has not required any medications.  Will d/c home with symptom control and expectant management.  Can follow-up with her urologist if any ongoing issues.  Discussed plan with patient, he acknowledged understanding and agreed with plan of care.  Return precautions given for new or worsening symptoms.  Final Clinical Impressions(s) / ED Diagnoses   Final diagnoses:  Flank pain  Hematuria, unspecified type  Ureteral stone    ED Discharge Orders        Ordered    oxyCODONE-acetaminophen (PERCOCET) 5-325 MG tablet  Every 4 hours PRN     02/12/17 0015    ondansetron (ZOFRAN ODT) 4 MG disintegrating tablet  Every 8 hours PRN     02/12/17 0015    tamsulosin (FLOMAX) 0.4 MG CAPS capsule  Daily after supper     02/12/17 0015       Larene Pickett, PA-C 02/12/17 [place], [place], DO 02/12/17 1517

## 2017-02-12 MED ORDER — ONDANSETRON 4 MG PO TBDP: 4.0000 mg | ORAL_TABLET | Freq: Three times a day (TID) | ORAL | 0 refills | Status: DC | PRN

## 2017-02-12 MED ORDER — TAMSULOSIN HCL 0.4 MG PO CAPS: 0.4000 mg | ORAL_CAPSULE | Freq: Every day | ORAL | 0 refills | Status: DC

## 2017-02-12 MED ORDER — OXYCODONE-ACETAMINOPHEN 5-325 MG PO TABS: 1.0000 | ORAL_TABLET | ORAL | 0 refills | Status: DC | PRN

## 2017-02-12 NOTE — Discharge Instructions (Signed)
Take the prescribed medication as directed as needed for you symptoms. Follow-up with your primary care doctor. Return to the ED for new or worsening symptoms.

## 2017-02-13 ENCOUNTER — Encounter: Payer: Self-pay | Admitting: Internal Medicine

## 2017-02-16 ENCOUNTER — Ambulatory Visit: Payer: 59 | Admitting: Medical

## 2017-02-16 ENCOUNTER — Encounter: Payer: Self-pay | Admitting: Medical

## 2017-02-16 VITALS — BP 122/80 | HR 66 | Wt 110.8 lb

## 2017-02-16 DIAGNOSIS — F419 Anxiety disorder, unspecified: Secondary | ICD-10-CM | POA: Diagnosis not present

## 2017-02-16 DIAGNOSIS — G8929 Other chronic pain: Secondary | ICD-10-CM

## 2017-02-16 DIAGNOSIS — M4125 Other idiopathic scoliosis, thoracolumbar region: Secondary | ICD-10-CM | POA: Diagnosis not present

## 2017-02-16 DIAGNOSIS — R5381 Other malaise: Secondary | ICD-10-CM | POA: Insufficient documentation

## 2017-02-16 DIAGNOSIS — M549 Dorsalgia, unspecified: Secondary | ICD-10-CM | POA: Diagnosis not present

## 2017-02-16 DIAGNOSIS — Z87442 Personal history of urinary calculi: Secondary | ICD-10-CM | POA: Diagnosis not present

## 2017-02-16 DIAGNOSIS — K219 Gastro-esophageal reflux disease without esophagitis: Secondary | ICD-10-CM | POA: Diagnosis not present

## 2017-02-16 DIAGNOSIS — J4540 Moderate persistent asthma, uncomplicated: Secondary | ICD-10-CM | POA: Diagnosis not present

## 2017-02-16 MED ORDER — DICLOFENAC SODIUM 75 MG PO TBEC: 75.0000 mg | DELAYED_RELEASE_TABLET | Freq: Two times a day (BID) | ORAL | 2 refills | Status: DC

## 2017-02-16 MED ORDER — ESOMEPRAZOLE MAGNESIUM 40 MG PO CPDR: 40.0000 mg | DELAYED_RELEASE_CAPSULE | Freq: Every day | ORAL | 3 refills | Status: DC

## 2017-02-16 MED ORDER — METHOCARBAMOL 500 MG PO TABS: 500.0000 mg | ORAL_TABLET | Freq: Two times a day (BID) | ORAL | 2 refills | Status: DC | PRN

## 2017-02-16 NOTE — Addendum Note (Signed)
Addended by: Marlon Pel on: 02/16/2017 03:09 PM   Modules accepted: Orders

## 2017-02-16 NOTE — Telephone Encounter (Signed)
Please let patient know I am glad the Augmentin helped with her abdominal bloating and SIBO  Regarding her reflux and esophageal symptoms; she does have a history of EoE despite this not being present on recent esophageal biopsies She feels Nexium worked better for her than pantoprazole Please see if we can get this approved for her Nexium 40 mg twice daily AC until symptoms come under better control and then can return to once daily Not improving after 7-10 days have her let me know and we may consider topical steroid therapy

## 2017-02-16 NOTE — Telephone Encounter (Signed)
I commented on her email and it returned to me again, so I wanted to be sure my comments were seen Thanks JMP

## 2017-02-16 NOTE — Progress Notes (Signed)
Subjective: Chief Complaint  Patient presents with  . b/p running high    b/p running high , headaches, pressure on side of neck    Here for not feeling well.  She had kidney stones last week just before thanksgiving but passed those.  In  general not feeling well.   Since she wasn't feeling well, she recently went into drug store to check BP which was 147/80s at [facility].  [facility] Majestic to urgent care for BP reading, had 129/80.   Today  head has hurt all day.  Some days feels worse than other days.  Face gets flushed, gets pressure in neck.   Has asthma and in the last few days has had a little more dyspnea.  in past month has used inhaler maybe once weekly.   Used inhaler a few more times last few days.   Not taking Flovent preventative inhaler except seasonal.  Hx/o chronic back pain - right SI joint has given some discomfort of recent.   Just ran out of diclofenac and robaxin.   Flares up occasionally.  Mood - no recent changes  GERD - insurance prefers Protonix, insurance wont pay for Nexium  No other aggravating or relieving factors. No other complaint.  Past Medical History:  Diagnosis Date  . Allergy   . Anxiety   . Asthma   . Chronic back pain   . Eosinophilic esophagitis   . Esophageal spasm   . Esophageal stricture   . Gastritis   . GERD (gastroesophageal reflux disease)   . Headache    occasional  . IBS (irritable bowel syndrome)   . Kidney stone 2015   gets every few years, hx/o calcium oxalate stones  . Osteopenia   . Scoliosis   . Seasonal allergic conjunctivitis   . Seasonal allergic rhinitis   . Small intestinal bacterial overgrowth   . Vitamin D deficiency   . Wears glasses    Current Outpatient Medications on File Prior to Visit  Medication Sig Dispense Refill  . albuterol (PROVENTIL HFA;VENTOLIN HFA) 108 (90 Base) MCG/ACT inhaler Inhale 2 puffs into the lungs every 6 (six) hours as needed for wheezing or shortness of breath. 1 Inhaler 1  . estradiol (VIVELLE-DOT)  0.1 MG/24HR patch Place 1 patch onto the skin 2 (two) times a week.   1  . fluticasone (FLOVENT HFA) 220 MCG/ACT inhaler Inhale 2 puffs into the lungs daily as needed. 1 Inhaler 5  . LORazepam (ATIVAN) 0.5 MG tablet Take 1 tablet (0.5 mg total) by mouth 2 (two) times daily as needed for anxiety. 60 tablet 0  . pantoprazole (PROTONIX) 40 MG tablet TAKE 1 TABLET BY MOUTH EVERY DAY 30 tablet 5  . progesterone (PROMETRIUM) 200 MG capsule Take 200 mg by mouth daily.     Current Facility-Administered Medications on File Prior to Visit  Medication Dose Route Frequency Provider Last Rate Last Dose  . 0.9 %  sodium chloride infusion  500 mL Intravenous Continuous Pyrtle, Lajuan Lines, MD      . lactulose (CHRONULAC) 10 GM/15ML solution 25 g  25 g Oral Once Levin Erp, PA         Review of Systems Constitutional: -fever, -chills, -sweats, -unexpected weight change, +fatigue ENT: -runny nose, -ear pain, -sore throat Cardiology:  -chest pain, -palpitations, -edema Respiratory: -cough, +shortness of breath, -wheezing Gastroenterology: +chronic abdominal pain, -nausea, -vomiting  Hematology: -bleeding or bruising problems Musculoskeletal: -arthralgias, +myalgias, -joint swelling, +back pain Ophthalmology: -vision changes Urology: -dysuria, -difficulty urinating, -hematuria, -  urinary frequency, -urgency Neurology: +headache, -weakness, -tingling, -numbness     Objective: BP 122/80   Pulse 66   Wt 110 lb 12.8 oz (50.3 kg)   SpO2 96%   BMI 20.27 kg/m   Wt Readings from Last 3 Encounters:  02/16/17 110 lb 12.8 oz (50.3 kg)  02/11/17 107 lb (48.5 kg)  02/02/17 110 lb 6.4 oz (50.1 kg)   BP Readings from Last 3 Encounters:  02/16/17 122/80  02/12/17 114/65  02/02/17 110/80     General appearance: alert, no distress, WD/WN,  HEENT: normocephalic, sclerae anicteric, PERRLA, EOMi, nares patent, no discharge or erythema, pharynx normal Oral cavity: MMM, no lesions Neck: supple, no  lymphadenopathy, no thyromegaly, no masses Heart: RRR, normal S1, S2, no murmurs Lungs: CTA bilaterally, no wheezes, rhonchi, or rales Abdomen: +bs, soft, non tender, non distended, no masses, no hepatomegaly, no splenomegaly Back: +moderate scoliosis, mild right SI tenderness, otherwise non tender and normal ROM Musculoskeletal: nontender, no swelling, no obvious deformity Extremities: no edema, no cyanosis, no clubbing Pulses: 2+ symmetric, upper and lower extremities, normal cap refill Neurological: alert, oriented x 3, CN2-12 intact, strength normal upper extremities and lower extremities, sensation normal throughout, DTRs 2+ throughout, no cerebellar signs, gait normal Psychiatric: normal affect, behavior normal, pleasant    Assessment: Encounter Diagnoses  Name Primary?  . Moderate persistent asthma without complication Yes  . Gastroesophageal reflux disease without esophagitis   . Other idiopathic scoliosis, thoracolumbar region   . Anxiety   . Chronic back pain, unspecified back location, unspecified back pain laterality   . History of renal calculi   . Malaise     Plan: Discussed her symptoms, concerns.   No significant finding of hypertension.  She does have several underlying issues and recent symptoms that suggest the cause of her symptoms to be multifactor including GERD, asthma, chronic back pain, sacroiliitis, recent kidney stone, chronic gastric/digestion issues.  advised she consider lap swimming, regular massage therapy, regular stretching program.  advised she get back on Flovent preventive inhaler the next few weeks, c/t albuterol prn, c/t PPI for GERD, refilled prn medications for muscle relaxer and anti-inflammatory.   If not seeing improvement in the next week, call back.   Reassured that BPs are normal.     [name] was seen today for b/p running high.  Diagnoses and all orders for this visit:  Moderate persistent asthma without  complication  Gastroesophageal reflux disease without esophagitis  Other idiopathic scoliosis, thoracolumbar region  Anxiety  Chronic back pain, unspecified back location, unspecified back pain laterality  History of renal calculi  Malaise  Other orders -     methocarbamol (ROBAXIN) 500 MG tablet; Take 1 tablet (500 mg total) by mouth 2 (two) times daily as needed (back pain). -     diclofenac (VOLTAREN) 75 MG EC tablet; Take 1 tablet (75 mg total) by mouth 2 (two) times daily.

## 2017-02-18 ENCOUNTER — Telehealth: Payer: Self-pay | Admitting: *Deleted

## 2017-02-18 MED ORDER — ESOMEPRAZOLE MAGNESIUM 40 MG PO CPDR: 40.0000 mg | DELAYED_RELEASE_CAPSULE | Freq: Two times a day (BID) | ORAL | 2 refills | Status: DC

## 2017-02-18 NOTE — Telephone Encounter (Signed)
I have completed prior authorization request on [email or web address] for patients esomeprazole twice daily dosing (to take twice daily until symptom flare is over then return to once daily dosing). Patient has tried and failed omeprazole and pantoprazole in the past. Insurance, it appears prefers omeprazole, pantoprazole, rabeprazole, and Dexilant. We are currently awaiting response from insurance.

## 2017-02-18 NOTE — Addendum Note (Signed)
Addended by: Larina Bras on: 02/18/2017 11:43 AM   Modules accepted: Orders

## 2017-02-19 ENCOUNTER — Telehealth: Payer: Self-pay | Admitting: Internal Medicine

## 2017-02-19 NOTE — Telephone Encounter (Signed)
According to records from previous GI dr, Patient has tried Aciphex, omeprazole 40 mg, Dexilant (formally Kapidex) Nexium 20 mg twice daily and pepcid in the past all with inadequate results for her GERD symptoms. I will contact patient's insurance again tomorrow to give updated information.

## 2017-02-23 NOTE — Telephone Encounter (Signed)
Initial PA denied. Sent additional PA request with additional clinical information. We are now awaiting decision from insurance.

## 2017-03-31 DIAGNOSIS — N760 Acute vaginitis: Secondary | ICD-10-CM | POA: Diagnosis not present

## 2017-05-08 ENCOUNTER — Ambulatory Visit: Payer: 59 | Admitting: Medical

## 2017-05-08 VITALS — BP 110/60 | HR 68 | Wt 109.8 lb

## 2017-05-08 DIAGNOSIS — L249 Irritant contact dermatitis, unspecified cause: Secondary | ICD-10-CM | POA: Diagnosis not present

## 2017-05-08 DIAGNOSIS — M67431 Ganglion, right wrist: Secondary | ICD-10-CM | POA: Insufficient documentation

## 2017-05-08 MED ORDER — TRIAMCINOLONE ACETONIDE 0.1 % EX CREA: 1.0000 "application " | TOPICAL_CREAM | Freq: Two times a day (BID) | CUTANEOUS | 0 refills | Status: DC

## 2017-05-08 NOTE — Assessment & Plan Note (Signed)
Symptoms and exam suggest contact dermatitis.  Advised 4-5 days of oral Benadryl over-the-counter 1/2-1 tablet twice daily, prescription for triamcinolone cream today for up to 1-2 weeks.  If not resolved within 1-2 weeks call back, possible referral to dermatology

## 2017-05-08 NOTE — Assessment & Plan Note (Signed)
We discussed her findings of ganglion cyst.  Gave options for therapy including watch and wait approach versus referral for further management.  She wants to leave it alone for now

## 2017-05-08 NOTE — Progress Notes (Signed)
   Subjective:    Patient ID: [name], female    DOB: 09-Aug-1960, 57 y.o.   MRN: [id number]  Here for rash of both upper arms x a few weeks, quite itchy, puffy at times, pink/red.   Has used OTC cortisone cream, which helps temporarily.  Hasn't used antihistamine.   Denies any specific triggers.  No new lotions or cleaning products.  Cant think of any exposures.  No other aggravating or relieving factors.  Has knot in right dorsal hand, nontender.   It has gone away in the past and returned.   One time a doctor unsuccessfully tried to aspirate it.  No other complaint.    Review of Systems  Constitutional: Negative for fever.  Respiratory: Negative for shortness of breath.   Cardiovascular: Negative for chest pain.  Neurological: Negative for numbness and headaches.       Objective:   Physical Exam  Constitutional: She appears well-developed and well-nourished.  Musculoskeletal:       Hands: Skin: Rash noted.  Faint puffy pink/red somewhat urticarial lesions bilat upper arms    BP 110/60   Pulse 68   Wt 109 lb 12.8 oz (49.8 kg)   SpO2 98%   BMI 20.08 kg/m       Assessment & Plan:   Encounter Diagnoses  Name Primary?  . Irritant contact dermatitis, unspecified trigger Yes  . Ganglion cyst of dorsum of right wrist   ' Ganglion cyst of dorsum of right wrist We discussed her findings of ganglion cyst.  Gave options for therapy including watch and wait approach versus referral for further management.  She wants to leave it alone for now  Irritant contact dermatitis Symptoms and exam suggest contact dermatitis.  Advised 4-5 days of oral Benadryl over-the-counter 1/2-1 tablet twice daily, prescription for triamcinolone cream today for up to 1-2 weeks.  If not resolved within 1-2 weeks call back, possible referral to dermatology

## 2017-06-02 ENCOUNTER — Telehealth: Payer: Self-pay | Admitting: Internal Medicine

## 2017-06-02 ENCOUNTER — Other Ambulatory Visit: Payer: Self-pay

## 2017-06-02 DIAGNOSIS — R1011 Right upper quadrant pain: Secondary | ICD-10-CM

## 2017-06-02 NOTE — Telephone Encounter (Signed)
Pt scheduled for [place] of abd at [facility] 06/04/17@9am , pt to arrive there at 8:45am. Pt to be NPO after midnight. Pt aware of appt.

## 2017-06-02 NOTE — Telephone Encounter (Signed)
Can proceed with abdominal ultrasound to evaluate upper abdominal pain and bloating, rule out gallbladder disease

## 2017-06-02 NOTE — Telephone Encounter (Signed)
Pt states that Dr. Hilarie Fredrickson had mentioned that if she continued to have problems he may consider ordering an ultrasound to look at her gallbladder. Pt reports she is still having issues with abd pain and bloating. Please advise.

## 2017-06-03 IMAGING — CT CT ANGIO CHEST
2 of 6 series · 19 of 36 positions shown · IV contrast (ISOVUE 370)
Comparison: 08/11/2013

CLINICAL DATA: Chest pain and shortness of breath. Elevated
D-dimer.

EXAM:
CT ANGIOGRAPHY CHEST WITH CONTRAST
TECHNIQUE: Multidetector CT imaging of the chest was performed using the
standard protocol during bolus administration of intravenous
contrast. Multiplanar CT image reconstructions and MIPs were
obtained to evaluate the vascular anatomy.
CONTRAST:  100 mL Isovue 370

[Series 7: coronal mpr · coronal · 0.50mm/px · 1 of 98 slices shown]
[im 49/98  mediastinal]
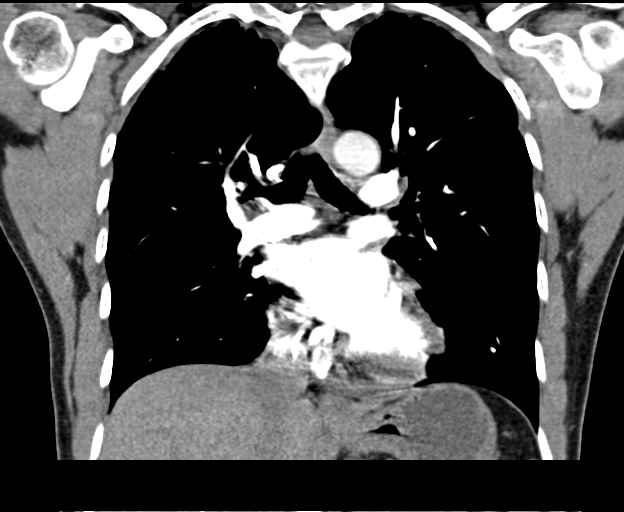

[Series 12: thins for pacs · axial · 0.59mm/px · z∈[+1174,+1405]mm · 18 of 257 slices shown]
[im 13/257  lung]
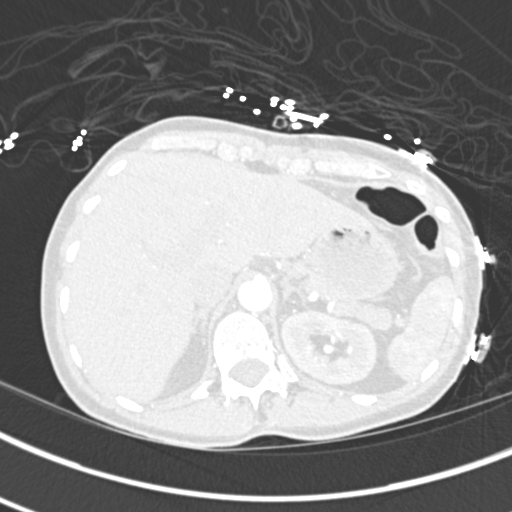
[im 26/257  mediastinal]
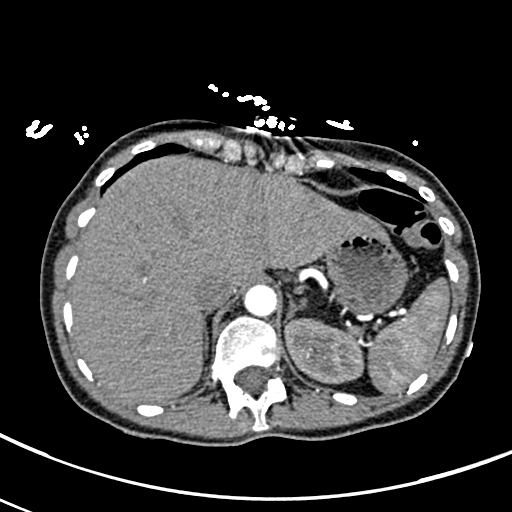
[im 39/257  lung]
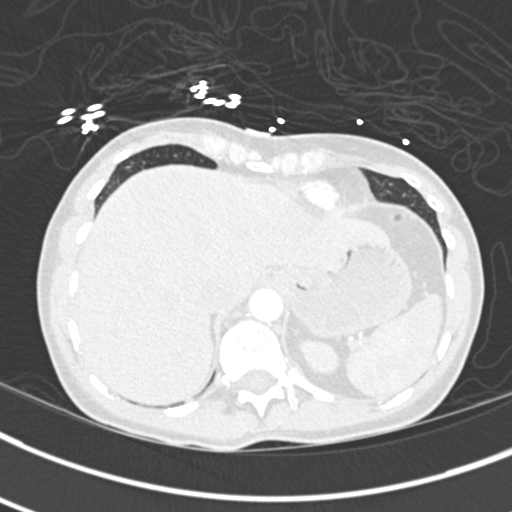
[im 52/257  mediastinal]
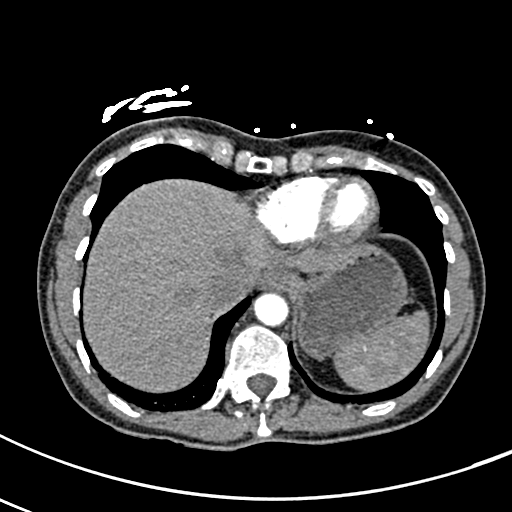
[im 65/257  lung]
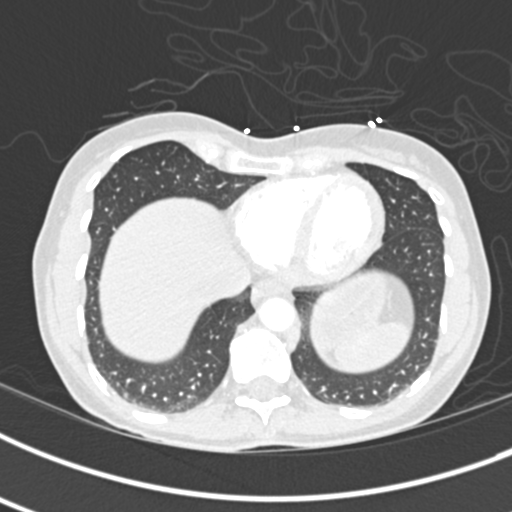
[im 77/257  mediastinal]
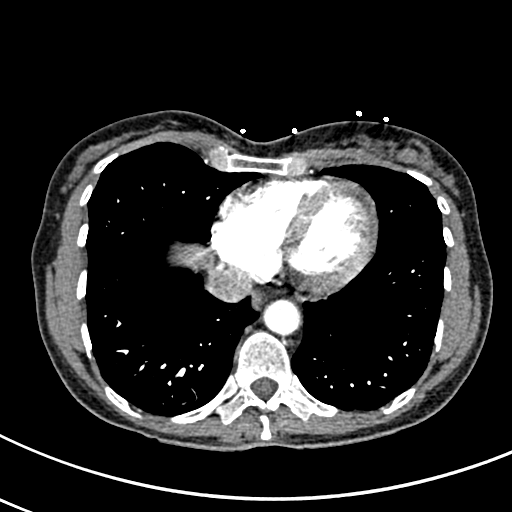
[im 90/257  lung]
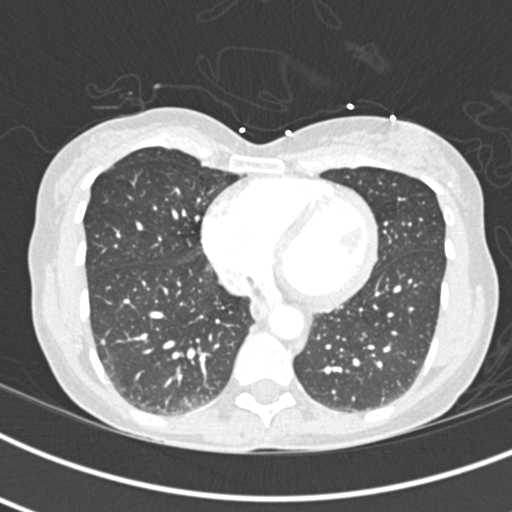
[im 103/257  mediastinal]
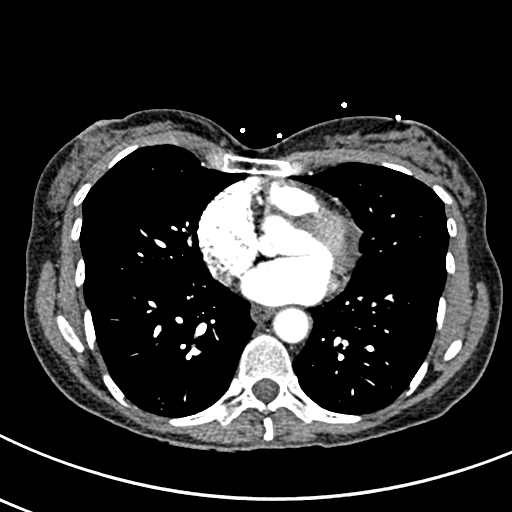
[im 116/257  lung]
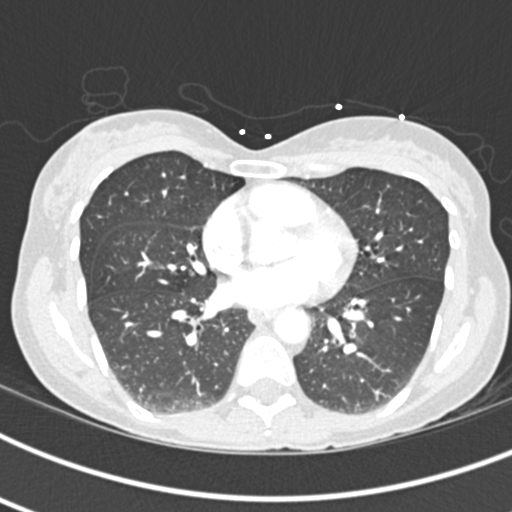
[im 141/257  mediastinal]
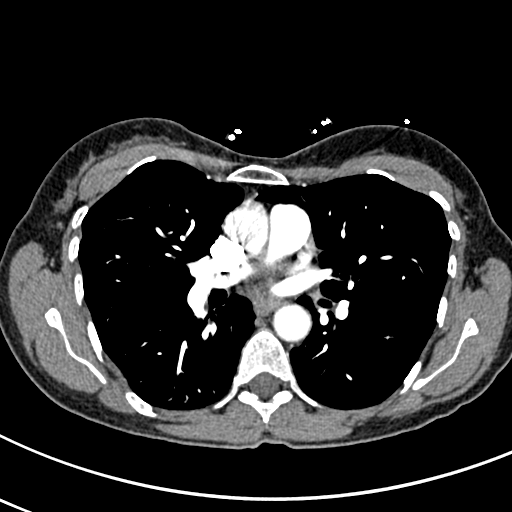
[im 154/257  lung]
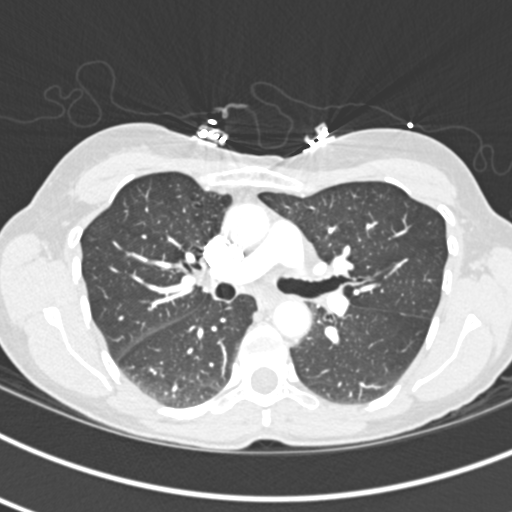
[im 167/257  mediastinal]
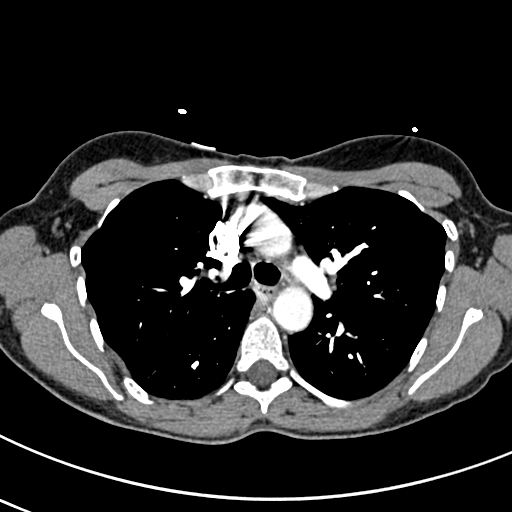
[im 180/257  lung]
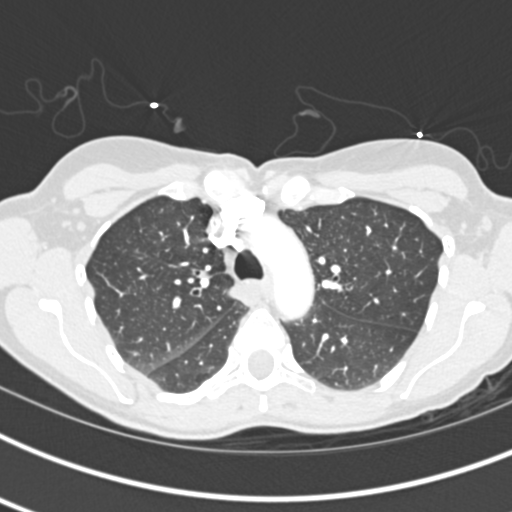
[im 193/257  mediastinal]
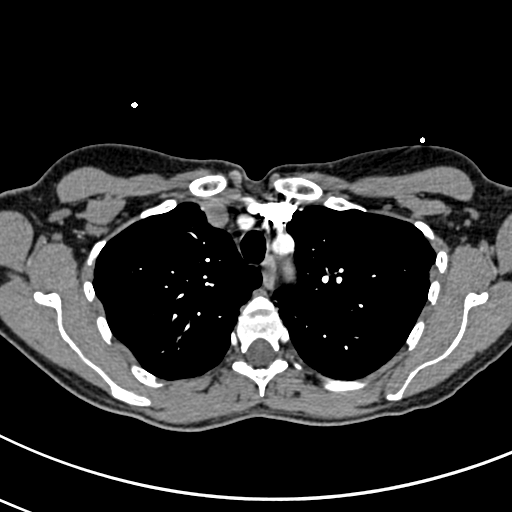
[im 205/257  lung]
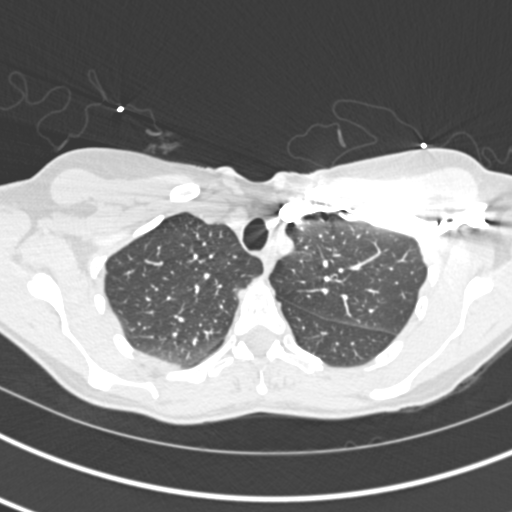
[im 218/257  mediastinal]
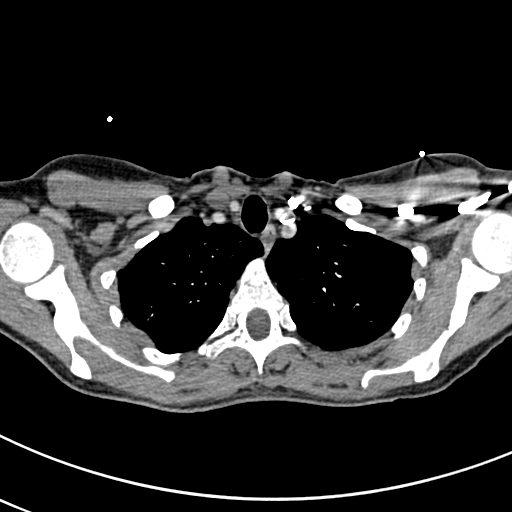
[im 231/257  lung]
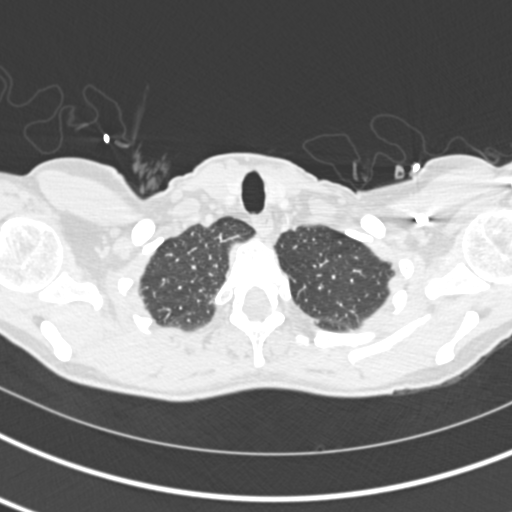
[im 244/257  mediastinal]
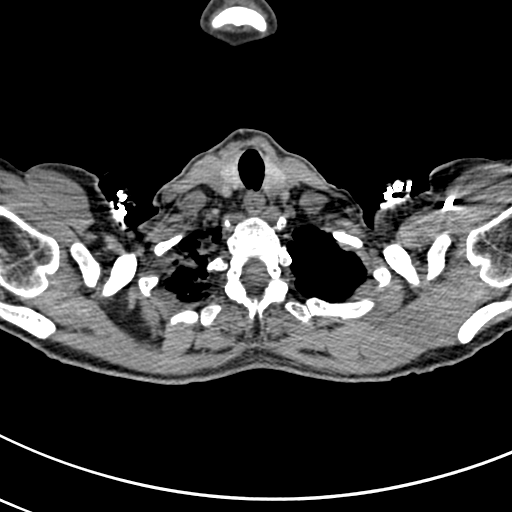

[19 of 36 positions shown; findings below may reference images not displayed]

FINDINGS: Cardiovascular: Satisfactory opacification of pulmonary arteries is
seen, and there is no evidence of pulmonary embolism. No evidence of
thoracic aortic aneurysm or dissection.

Mediastinum/Lymph Nodes: No masses or pathologically enlarged lymph
nodes identified.

Lungs/Pleura: No pulmonary mass, infiltrate, or effusion.

Upper abdomen: No acute findings.

Musculoskeletal: No chest wall mass or suspicious bone lesions
identified.

Review of the MIP images confirms the above findings.
IMPRESSION: Negative. No evidence of pulmonary embolism or other active disease.

## 2017-06-04 ENCOUNTER — Ambulatory Visit (HOSPITAL_COMMUNITY)
Admission: RE | Admit: 2017-06-04 | Discharge: 2017-06-04 | Disposition: A | Discharge status: Home / Self Care | Payer: 59 | Source: Ambulatory Visit | Attending: Internal Medicine | Admitting: Internal Medicine

## 2017-06-04 DIAGNOSIS — R1011 Right upper quadrant pain: Secondary | ICD-10-CM | POA: Insufficient documentation

## 2017-06-04 DIAGNOSIS — N760 Acute vaginitis: Secondary | ICD-10-CM | POA: Diagnosis not present

## 2017-06-04 DIAGNOSIS — N951 Menopausal and female climacteric states: Secondary | ICD-10-CM | POA: Diagnosis not present

## 2017-06-05 DIAGNOSIS — N950 Postmenopausal bleeding: Secondary | ICD-10-CM | POA: Diagnosis not present

## 2017-06-05 DIAGNOSIS — R102 Pelvic and perineal pain: Secondary | ICD-10-CM | POA: Diagnosis not present

## 2017-06-05 DIAGNOSIS — N946 Dysmenorrhea, unspecified: Secondary | ICD-10-CM | POA: Diagnosis not present

## 2017-06-10 DIAGNOSIS — M25531 Pain in right wrist: Secondary | ICD-10-CM | POA: Diagnosis not present

## 2017-06-10 DIAGNOSIS — M67431 Ganglion, right wrist: Secondary | ICD-10-CM | POA: Diagnosis not present

## 2017-06-11 ENCOUNTER — Other Ambulatory Visit: Payer: Self-pay | Admitting: Orthopedic Surgery

## 2017-06-11 DIAGNOSIS — B379 Candidiasis, unspecified: Secondary | ICD-10-CM | POA: Diagnosis not present

## 2017-06-11 DIAGNOSIS — R9389 Abnormal findings on diagnostic imaging of other specified body structures: Secondary | ICD-10-CM | POA: Diagnosis not present

## 2017-06-16 ENCOUNTER — Ambulatory Visit: Payer: 59 | Admitting: Gastroenterology

## 2017-06-16 ENCOUNTER — Encounter: Payer: Self-pay | Admitting: Gastroenterology

## 2017-06-16 VITALS — BP 100/70 | HR 64 | Ht 61.5 in | Wt 110.1 lb

## 2017-06-16 DIAGNOSIS — R140 Abdominal distension (gaseous): Secondary | ICD-10-CM | POA: Diagnosis not present

## 2017-06-16 NOTE — Progress Notes (Addendum)
06/16/2017 [name] [id number] [birth year]   HISTORY OF PRESENT ILLNESS:  This is a 57 year old female who is known to Dr. Hilarie Fredrickson.  She has a past medical history of GERD, EoE, IBS, and SIBO who returns for follow-up.  She had upper endoscopy to evaluate dysphagia, epigastric abdominal pain, abdominal bloating and her history of EoE in 11/2016.  This showed a normal esophagus.  There were no strictures seen.  Biopsies were performed of the proximal and distal esophagus for EoE.  The stomach and duodenum were normal appearing.  Esophagus biopsy showed benign squamous mucosa with no increase in intraepithelial eosinophils.  In the stomach there was mild chronic gastritis without H. pylori.  Duodenal biopsies were normal.   She has been maintained on pantoprazole 40 mg once daily.  Her biggest issue continues to be abdominal bloating.  At times this can be dramatic and cause her to be uncomfortable in her bilateral upper abdomen.  She had been using VSL 3 as a probiotic, but then was treated with Augmentin for SIBO in November and never resumed her probiotic.  She feels that the Augmentin may have helped some but it only lasted about one week.  She occasionally uses Beano and simethicone.  She does not feel constipated.    She was treated with rifaximin for 14 days for SIBO after positive breath test in February 2018.  This medication made her have dizziness and reportedly felt "off in my head" so she "could not take it".  Thinks that IBgard helps somewhat but not great.  Ultrasound of the abdomen was just performed and was normal.  She says that she has not been able to identify certain foods that cause her symptoms.  She says that she can drink water and immediately become bloated.  She says that she had acupuncture and that the Asian lady who performed that gave her some herbs to take for her symptoms and they did seem to help to some degree.  She only used them for a short time,  however.   Past Medical History:  Diagnosis Date  . Allergy   . Anxiety   . Asthma   . Chronic back pain   . Eosinophilic esophagitis   . Esophageal spasm   . Esophageal stricture   . Gastritis   . GERD (gastroesophageal reflux disease)   . Headache    occasional  . IBS (irritable bowel syndrome)   . Kidney stone 2015   gets every few years, hx/o calcium oxalate stones  . Osteopenia   . Scoliosis   . Seasonal allergic conjunctivitis   . Seasonal allergic rhinitis   . Small intestinal bacterial overgrowth   . Vitamin D deficiency   . Wears glasses    Past Surgical History:  Procedure Laterality Date  . APPENDECTOMY    . BREAST EXCISIONAL BIOPSY     left  . COLONOSCOPY  2013   [place], [place]  . ESOPHAGEAL DILATION     several times prior, [place], [place]  . ESOPHAGOPLASTY  06/979   complication of esophageal dilitation, hospitalization at [place] Bilateral 09/14/2015   Procedure: INCISION AND DRAINAGE ABSCESS;  Surgeon: Leanora Cover, MD;  Location: [facility];  Service: Orthopedics;  Laterality: Bilateral;  . [place]  . LITHOTRIPSY     4x prior as of 05/2014    reports that she has never smoked. She has never used  smokeless tobacco. She reports that she drinks about 4.2 oz of alcohol per week. She reports that she does not use drugs. family history includes Diabetes in her father and sister; Heart disease in her maternal grandfather; Hypertension in her father; Kidney Stones in her brother and father; Migraines in her brother. No Known Allergies    Outpatient Encounter Medications as of 06/16/2017  Medication Sig  . albuterol (PROVENTIL HFA;VENTOLIN HFA) 108 (90 Base) MCG/ACT inhaler Inhale 2 puffs into the lungs every 6 (six) hours as needed for wheezing or shortness of breath.  . diclofenac (VOLTAREN) 75 MG EC tablet Take 1 tablet (75 mg total) by mouth 2 (two) times daily.  . fluticasone (FLOVENT  HFA) 220 MCG/ACT inhaler Inhale 2 puffs into the lungs daily as needed.  Marland Kitchen LORazepam (ATIVAN) 0.5 MG tablet Take 1 tablet (0.5 mg total) by mouth 2 (two) times daily as needed for anxiety.  . methocarbamol (ROBAXIN) 500 MG tablet Take 1 tablet (500 mg total) by mouth 2 (two) times daily as needed (back pain).  . pantoprazole (PROTONIX) 40 MG tablet Take 40 mg by mouth daily.  . [DISCONTINUED] estradiol (VIVELLE-DOT) 0.1 MG/24HR patch Place 1 patch onto the skin 2 (two) times a week.   . [DISCONTINUED] progesterone (PROMETRIUM) 200 MG capsule Take 200 mg by mouth daily.  . [DISCONTINUED] triamcinolone cream (KENALOG) 0.1 % Apply 1 application topically 2 (two) times daily.   Facility-Administered Encounter Medications as of 06/16/2017  Medication  . 0.9 %  sodium chloride infusion  . lactulose (CHRONULAC) 10 GM/15ML solution 25 g     REVIEW OF SYSTEMS  : All other systems reviewed and negative except where noted in the History of Present Illness.   PHYSICAL EXAM: BP 100/70 (BP Location: Left Arm, Patient Position: Sitting, Cuff Size: Normal)   Pulse 64   Ht 5' 1.5" (1.562 m) Comment: height measured without shoes  Wt 110 lb 2 oz (50 kg)   BMI 20.47 kg/m  General: Well developed white female in no acute distress Head: Normocephalic and atraumatic Eyes:  Sclerae anicteric, conjunctiva pink. Ears: Normal auditory acuity Lungs: Clear throughout to auscultation; no increased WOB. Heart: Regular rate and rhythm; no M/R/G. Abdomen: Soft, non-distended.  BS present.  Non-tender. Musculoskeletal: Symmetrical with no gross deformities  Skin: No lesions on visible extremities Extremities: No edema  Neurological: Alert oriented x 4, grossly non-focal Psychological:  Alert and cooperative. Normal mood and affect  ASSESSMENT AND PLAN: *Upper abdominal bloating: This has been an ongoing complaint for quite some time.  Has been treated for SIBO without resolution of her symptoms.  So far  extensive evaluation has proved unremarkable and several medications have been unable to resolve her symptoms.  She will get some FD Donald Prose and try that instead of the IB guard.  She had also tried some herbs from a [place] lady who did acupuncture on her and those seemed to help some.  She says that she may try those again as well.  She will resume her VSL #3 probiotic daily.  We discussed a very small possibility that she could have some gallbladder dysfunction and then also possibly some gastroparesis, which would be idiopathic.  We discussed the studies and the treatment for these 2 conditions.  She is going to try these regimen stated above and will contact us back if she decides to proceed with either of those studies.   **25 minutes were spent with the patient in which at least 50% was spent  in discussion of diagnoses and treatment options.  CC:  Tysinger, Camelia Eng, PA-C   Addendum: Reviewed and agree with ongoing management. Pyrtle, Lajuan Lines, MD

## 2017-06-16 NOTE — Patient Instructions (Signed)
Please purchase the following medications over the counter and take as directed:  FD gard per box instructions.   Use VSL #3 daily.

## 2017-06-18 DIAGNOSIS — N950 Postmenopausal bleeding: Secondary | ICD-10-CM | POA: Diagnosis not present

## 2017-06-18 DIAGNOSIS — N951 Menopausal and female climacteric states: Secondary | ICD-10-CM | POA: Diagnosis not present

## 2017-07-13 DIAGNOSIS — R300 Dysuria: Secondary | ICD-10-CM | POA: Diagnosis not present

## 2017-07-13 DIAGNOSIS — N390 Urinary tract infection, site not specified: Secondary | ICD-10-CM | POA: Diagnosis not present

## 2017-07-13 DIAGNOSIS — R319 Hematuria, unspecified: Secondary | ICD-10-CM | POA: Diagnosis not present

## 2017-07-28 ENCOUNTER — Other Ambulatory Visit: Payer: Self-pay | Admitting: Internal Medicine

## 2017-08-28 ENCOUNTER — Ambulatory Visit: Payer: 59 | Admitting: Medical

## 2017-08-28 ENCOUNTER — Encounter: Payer: Self-pay | Admitting: Medical

## 2017-08-28 VITALS — BP 120/70 | HR 76 | Temp 97.8°F | Resp 16 | Wt 106.6 lb

## 2017-08-28 DIAGNOSIS — J4530 Mild persistent asthma, uncomplicated: Secondary | ICD-10-CM

## 2017-08-28 DIAGNOSIS — R0609 Other forms of dyspnea: Secondary | ICD-10-CM | POA: Diagnosis not present

## 2017-08-28 DIAGNOSIS — K219 Gastro-esophageal reflux disease without esophagitis: Secondary | ICD-10-CM

## 2017-08-28 DIAGNOSIS — Z8249 Family history of ischemic heart disease and other diseases of the circulatory system: Secondary | ICD-10-CM | POA: Diagnosis not present

## 2017-08-28 DIAGNOSIS — R0600 Dyspnea, unspecified: Secondary | ICD-10-CM

## 2017-08-28 DIAGNOSIS — R0789 Other chest pain: Secondary | ICD-10-CM

## 2017-08-28 MED ORDER — FLUTICASONE FUROATE-VILANTEROL 100-25 MCG/INH IN AEPB: 1.0000 | INHALATION_SPRAY | Freq: Every day | RESPIRATORY_TRACT | 0 refills | Status: DC

## 2017-08-28 NOTE — Progress Notes (Signed)
Subjective: Chief Complaint  Patient presents with  . chest heaviness    chest heaviness, and short of breathing. comes and goes. this is 5th day. needs a refill on proair inhaler   Here for complaint of chest tightness/chest heaviness.  She has had this for several days.  She has a history of asthma but does not think this is asthma related.  She does have a history of GERD as well, is taking her GERD medicine, also taking her Flovent daily for asthma.  Has been using albuterol some but does not feel wheezing or shortness of breath necessarily.  She does have some upper belly pressure.  Denies eating any recent GERD triggers no recent strenuous exercise or activity no trauma to the chest.  No paresthesias, no edema, no nausea.  She does get hot flashes and sweats related to perimenopausal symptoms.  No other aggravating or relieving factors.  No other complaint.  Past Medical History:  Diagnosis Date  . Allergy   . Anxiety   . Asthma   . Chronic back pain   . Eosinophilic esophagitis   . Esophageal spasm   . Esophageal stricture   . Gastritis   . GERD (gastroesophageal reflux disease)   . Headache    occasional  . IBS (irritable bowel syndrome)   . Kidney stone 2015   gets every few years, hx/o calcium oxalate stones  . Osteopenia   . Scoliosis   . Seasonal allergic conjunctivitis   . Seasonal allergic rhinitis   . Small intestinal bacterial overgrowth   . Vitamin D deficiency   . Wears glasses    Current Outpatient Medications on File Prior to Visit  Medication Sig Dispense Refill  . albuterol (PROVENTIL HFA;VENTOLIN HFA) 108 (90 Base) MCG/ACT inhaler Inhale 2 puffs into the lungs every 6 (six) hours as needed for wheezing or shortness of breath. 1 Inhaler 1  . diclofenac (VOLTAREN) 75 MG EC tablet Take 1 tablet (75 mg total) by mouth 2 (two) times daily. 45 tablet 2  . fluticasone (FLOVENT HFA) 220 MCG/ACT inhaler Inhale 2 puffs into the lungs daily as needed. 1 Inhaler 5  .  LORazepam (ATIVAN) 0.5 MG tablet Take 1 tablet (0.5 mg total) by mouth 2 (two) times daily as needed for anxiety. 60 tablet 0  . methocarbamol (ROBAXIN) 500 MG tablet Take 1 tablet (500 mg total) by mouth 2 (two) times daily as needed (back pain). 45 tablet 2  . pantoprazole (PROTONIX) 40 MG tablet TAKE 1 TABLET BY MOUTH EVERY DAY 30 tablet 2   Current Facility-Administered Medications on File Prior to Visit  Medication Dose Route Frequency Provider Last Rate Last Dose  . 0.9 %  sodium chloride infusion  500 mL Intravenous Continuous Pyrtle, Lajuan Lines, MD      . lactulose (CHRONULAC) 10 GM/15ML solution 25 g  25 g Oral Once Levin Erp, PA       Family History  Problem Relation Age of Onset  . Diabetes Father   . Kidney Stones Father   . Hypertension Father   . Diabetes Sister   . Kidney Stones Brother   . Migraines Brother   . Heart disease Maternal Grandfather   . Cancer Neg Hx   . Stroke Neg Hx   . Colon cancer Neg Hx   . Esophageal cancer Neg Hx   . Stomach cancer Neg Hx      ROS as in subjective   Objective: BP 120/70   Pulse 76  Temp 97.8 F (36.6 C) (Oral)   Resp 16   Wt 106 lb 9.6 oz (48.4 kg)   SpO2 97%   BMI 19.82 kg/m  Wt Readings from Last 3 Encounters:  08/28/17 106 lb 9.6 oz (48.4 kg)  06/16/17 110 lb 2 oz (50 kg)  05/08/17 109 lb 12.8 oz (49.8 kg)    General appearance: alert, no distress, WD/WN,  Neck: supple, no lymphadenopathy, no thyromegaly, no masses, no JVD Heart: RRR, normal S1, S2, no murmurs Lungs: CTA bilaterally, no wheezes, rhonchi, or rales Chest wall nontender, normal I:D, no obvious deformity Abdomen: +bs, soft, non tender, non distended, no masses, no hepatomegaly, no splenomegaly Pulses: 2+ symmetric, upper and lower extremities, normal cap refill Ext: no edema      Adult ECG Report  Indication: chest heaviness  Rate: 72 bpm  Rhythm: normal sinus rhythm  QRS Axis: -43 degrees  PR Interval: 130 ms  QRS Duration: 70  ms  QTc: 405 ms  Conduction Disturbances: none  Other Abnormalities: left axis deviation  Patient's cardiac risk factors are: none.  EKG comparison: 2017 EKG  Narrative Interpretation: left axis deviation, no acute changes    Assessment: Encounter Diagnoses  Name Primary?  . Chest tightness Yes  . DOE (dyspnea on exertion)   . Mild persistent asthma without complication   . Gastroesophageal reflux disease without esophagitis   . Family history of heart disease     Plan: We discussed her symptoms and concerns.  Possible differential.  She has a known history of asthma and GERD.  She just got back from vacation was eating more tomato based foods although she did not have her typical acid reflux symptoms.  I advised she use her PPI twice daily for the next 4 to 5 days, add some antacids twice a day, avoid GERD triggers.  Let us see if this helps her symptoms.  Regarding asthma, begin trial of Breo once daily instead of her Flovent to see if this gives her better exercise tolerance with her asthma.  Continue albuterol as needed  Given her concerns for dyspnea on exertion or decreased exercise tolerance and family history of heart disease we will refer for cardiology eval at her request   Larra was seen today for chest heaviness.  Diagnoses and all orders for this visit:  Chest tightness -     EKG 12-Lead -     Ambulatory referral to Cardiology  DOE (dyspnea on exertion) -     Ambulatory referral to Cardiology  Mild persistent asthma without complication  Gastroesophageal reflux disease without esophagitis  Family history of heart disease  Other orders -     fluticasone furoate-vilanterol (BREO ELLIPTA) 100-25 MCG/INH AEPB; Inhale 1 puff into the lungs daily.

## 2017-09-10 ENCOUNTER — Encounter (HOSPITAL_BASED_OUTPATIENT_CLINIC_OR_DEPARTMENT_OTHER): Admission: RE | Payer: Self-pay | Source: Ambulatory Visit

## 2017-09-10 ENCOUNTER — Ambulatory Visit (HOSPITAL_BASED_OUTPATIENT_CLINIC_OR_DEPARTMENT_OTHER): Admission: RE | Admit: 2017-09-10 | Payer: 59 | Source: Ambulatory Visit | Admitting: Orthopedic Surgery

## 2017-09-10 SURGERY — EXCISION, GANGLION CYST, WRIST
Anesthesia: Choice | Laterality: Right

## 2017-09-23 ENCOUNTER — Other Ambulatory Visit: Payer: Self-pay | Admitting: Medical

## 2017-09-23 ENCOUNTER — Telehealth: Payer: Self-pay | Admitting: Medical

## 2017-09-23 MED ORDER — FLUTICASONE FUROATE-VILANTEROL 100-25 MCG/INH IN AEPB: 1.0000 | INHALATION_SPRAY | Freq: Every day | RESPIRATORY_TRACT | 2 refills | Status: DC

## 2017-09-23 MED ORDER — LORAZEPAM 0.5 MG PO TABS: 0.5000 mg | ORAL_TABLET | Freq: Two times a day (BID) | ORAL | 0 refills | Status: DC | PRN

## 2017-09-23 NOTE — Telephone Encounter (Signed)
Pt has question regarding doubling up on the Protonix which she did temporarily as directed, said it did help, but now problem is back, should she keep doing that.  Also the chest pains have been really stressing her out and she wanted to know if she could increase the Ativan to help her.  She would like you to call her back.  Said can't get into the heart doctor til end of July and it is unnerving that she can't deal with this til end of month.

## 2017-09-23 NOTE — Telephone Encounter (Signed)
Called and talked to patient.  She will continue Protonix short term BID, gave permission to use Ativan BID for now, c/t breo.  F/u as planned with cardiology.  We discussed symptoms that would prompt call to 911.    Call us back if other questions

## 2017-09-27 ENCOUNTER — Encounter: Payer: Self-pay | Admitting: Internal Medicine

## 2017-09-28 NOTE — Telephone Encounter (Signed)
Please let patient know that I reviewed her email She has a history of GERD but also EoE It is possible that her discomfort is related to EOE which would not necessarily respond to twice daily PPI  I would recommend she take pantoprazole 40 mg a day 30 minutes before breakfast I would recommend trial of budesonide slurry x6 weeks If no improvement in atypical chest pain I would recommend that we do esophageal manometry with 24-hour pH and impedance testing to evaluate esophageal motility, rule out esophageal spasm and also evaluate truly if her reflux is controlled and how many reflux episodes she is having and how it correlates with symptoms

## 2017-09-29 ENCOUNTER — Other Ambulatory Visit: Payer: Self-pay

## 2017-09-29 MED ORDER — AMBULATORY NON FORMULARY MEDICATION: 0 refills | Status: DC

## 2017-10-09 ENCOUNTER — Encounter: Payer: Self-pay | Admitting: Internal Medicine

## 2017-10-20 ENCOUNTER — Encounter

## 2017-10-21 ENCOUNTER — Ambulatory Visit: Payer: 59 | Admitting: Interventional Cardiology

## 2017-10-21 ENCOUNTER — Encounter: Payer: Self-pay | Admitting: Interventional Cardiology

## 2017-10-21 VITALS — BP 122/80 | HR 76 | Ht 62.0 in | Wt 108.0 lb

## 2017-10-21 DIAGNOSIS — R0609 Other forms of dyspnea: Secondary | ICD-10-CM | POA: Diagnosis not present

## 2017-10-21 DIAGNOSIS — R002 Palpitations: Secondary | ICD-10-CM

## 2017-10-21 DIAGNOSIS — R079 Chest pain, unspecified: Secondary | ICD-10-CM | POA: Diagnosis not present

## 2017-10-21 DIAGNOSIS — R0600 Dyspnea, unspecified: Secondary | ICD-10-CM

## 2017-10-21 NOTE — Patient Instructions (Signed)
Medication Instructions:  Your physician recommends that you continue on your current medications as directed. Please refer to the Current Medication list given to you today.  Labwork: None  Testing/Procedures: Your physician has requested that you have an exercise tolerance test. For further information please visit [email or web address]. Please also follow instruction sheet, as given.  Your physician has requested that you have an echocardiogram. Echocardiography is a painless test that uses sound waves to create images of your heart. It provides your doctor with information about the size and shape of your heart and how well your heart's chambers and valves are working. This procedure takes approximately one hour. There are no restrictions for this procedure.   Follow-Up: Your physician recommends that you schedule a follow-up appointment as needed with Dr. Smith.    Any Other Special Instructions Will Be Listed Below (If Applicable).     If you need a refill on your cardiac medications before your next appointment, please call your pharmacy.   

## 2017-10-21 NOTE — Progress Notes (Signed)
Cardiology Office Note:    Date:  10/21/2017   ID:  Dannielle Cooke, DOB [birth year], MRN [id number]  PCP:  Carlena Hurl, PA-C  Cardiologist:  No primary care provider on file.   Referring MD: Carlena Hurl, PA-C   Chief Complaint  Patient presents with  . Chest Pain  . Irregular Heart Beat    History of Present Illness:    [name] is a 57 y.o. female with a hx of sensation the heart is pounding, exertional fatigue, and chest discomfort.  Referred by [name].  Tysinger.  She has a several month history of awareness of the heart beating.  She denies tachycardia and racing heart.  Family history of arrhythmia is noted with 1 of her sisters having had an ablation for PVCs.  She is personally never had syncope or near syncope.  There is a prior history in June of chest pressure.  This was nearly continuously present.  She has a history of eosinophilic esophagitis.  A new medication was started for this and that sensation is improving although she does note that was collected with a her endurance is quite reduced.  Past Medical History:  Diagnosis Date  . Allergy   . Anxiety   . Asthma   . Chronic back pain   . Eosinophilic esophagitis   . Esophageal spasm   . Esophageal stricture   . Gastritis   . GERD (gastroesophageal reflux disease)   . Headache    occasional  . IBS (irritable bowel syndrome)   . Kidney stone 2015   gets every few years, hx/o calcium oxalate stones  . Osteopenia   . Scoliosis   . Seasonal allergic conjunctivitis   . Seasonal allergic rhinitis   . Small intestinal bacterial overgrowth   . Vitamin D deficiency   . Wears glasses     Past Surgical History:  Procedure Laterality Date  . APPENDECTOMY    . BREAST EXCISIONAL BIOPSY     left  . COLONOSCOPY  2013   [place], [place]  . ESOPHAGEAL DILATION     several times prior, [place], [place]  . ESOPHAGOPLASTY  06/2704   complication of esophageal dilitation, hospitalization at [facility] Bilateral 09/14/2015   Procedure: INCISION AND DRAINAGE ABSCESS;  Surgeon: Leanora Cover, MD;  Location: [facility];  Service: Orthopedics;  Laterality: Bilateral;  . [place]  . LITHOTRIPSY     4x prior as of 05/2014    Current Medications: Current Meds  Medication Sig  . albuterol (PROVENTIL HFA;VENTOLIN HFA) 108 (90 Base) MCG/ACT inhaler Inhale 2 puffs into the lungs every 6 (six) hours as needed for wheezing or shortness of breath.  . AMBULATORY NON FORMULARY MEDICATION Medication Name: Budesonide Slurry 2mg /18ml         Take 1mg  or 5cc by mouth BID for 6 weeks  . diclofenac (VOLTAREN) 75 MG EC tablet Take 1 tablet (75 mg total) by mouth 2 (two) times daily. (Patient taking differently: Take 75 mg by mouth 2 (two) times daily as needed. )  . fluticasone furoate-vilanterol (BREO ELLIPTA) 100-25 MCG/INH AEPB Inhale 1 puff into the lungs daily.  Marland Kitchen LORazepam (ATIVAN) 0.5 MG tablet Take 1 tablet (0.5 mg total) by mouth 2 (two) times daily as needed for anxiety.  . methocarbamol (ROBAXIN) 500 MG tablet Take 1 tablet (500 mg total) by mouth 2 (two) times daily as needed (back pain).  Marland Kitchen  pantoprazole (PROTONIX) 40 MG tablet TAKE 1 TABLET BY MOUTH EVERY DAY   Current Facility-Administered Medications for the 10/21/17 encounter (Office Visit) with Belva Crome, MD  Medication  . 0.9 %  sodium chloride infusion  . lactulose (CHRONULAC) 10 GM/15ML solution 25 g     Allergies:   Patient has no known allergies.   Social History   Socioeconomic History  . Marital status: Single    Spouse name: Not on file  . Number of children: 0  . Years of education: Not on file  . Highest education level: Not on file  Occupational History  . Occupation: Tax adviser    Comment: Midwife  Social Needs  . Financial resource strain: Not on file  . Food insecurity:    Worry: Not on file    Inability: Not on file  .  Transportation needs:    Medical: Not on file    Non-medical: Not on file  Tobacco Use  . Smoking status: Never Smoker  . Smokeless tobacco: Never Used  Substance and Sexual Activity  . Alcohol use: Yes    Alcohol/week: 4.2 oz    Types: 7 Glasses of wine per week  . Drug use: No  . Sexual activity: Not on file  Lifestyle  . Physical activity:    Days per week: Not on file    Minutes per session: Not on file  . Stress: Not on file  Relationships  . Social connections:    Talks on phone: Not on file    Gets together: Not on file    Attends religious service: Not on file    Active member of club or organization: Not on file    Attends meetings of clubs or organizations: Not on file    Relationship status: Not on file  Other Topics Concern  . Not on file  Social History Narrative   2 dogs, muts [place], exercise 3-4 days per week with walking, Yoga, weights, CPA.  Customer service manager at [employer]     Family History: The patient's family history includes Diabetes in her father and sister; Heart attack in her father; Heart disease in her father and maternal grandfather; Hypertension in her father; Kidney Stones in her brother and father; Migraines in her brother. There is no history of Cancer, Stroke, Colon cancer, Esophageal cancer, or Stomach cancer.  ROS:   Please see the history of present illness.    PND, anxiety, and back pain.  Eosinophilic esophagitis.  Concerned about a family history of CAD and MI (grandfather, 2 uncles, and father had CAD and coronary interventions surgery either surgery or stents.  She has an excellent recent lipid profile.  All other systems reviewed and are negative.  EKGs/Labs/Other Studies Reviewed:    The following studies were reviewed today: Records from [place] Recent lipid profile was excellent with HDL and low LDL.  EKG:  EKG is not ordered today.  Tracing performed in June 2019 is normal.  Recent Labs: 02/11/2017: BUN  12; Creatinine, Ser 0.76; Hemoglobin 12.4; Platelets 288; Potassium 3.4; Sodium 137  Recent Lipid Panel    Component Value Date/Time   CHOL 181 10/21/2016 0812   TRIG 83 10/21/2016 0812   HDL 77 10/21/2016 0812   CHOLHDL 2.4 10/21/2016 0812   VLDL 17 10/21/2016 0812   LDLCALC 87 10/21/2016 0812    Physical Exam:    VS:  BP 122/80   Pulse 76   Ht 5\' 2"  (1.575  m)   Wt 108 lb (49 kg)   BMI 19.75 kg/m     Wt Readings from Last 3 Encounters:  10/21/17 108 lb (49 kg)  08/28/17 106 lb 9.6 oz (48.4 kg)  06/16/17 110 lb 2 oz (50 kg)     GEN:  Well nourished, well developed in no acute distress HEENT: Normal NECK: No JVD. LYMPHATICS: No lymphadenopathy CARDIAC: RRR, no murmur, no gallop, no edema. VASCULAR: 2+ radial posterior tibial pulses.  No bruits. RESPIRATORY:  Clear to auscultation without rales, wheezing or rhonchi  ABDOMEN: Soft, non-tender, non-distended, No pulsatile mass, MUSCULOSKELETAL: No deformity  SKIN: Warm and dry NEUROLOGIC:  Alert and oriented x 3 PSYCHIATRIC:  Normal affect   ASSESSMENT:    1. Chest pain, unspecified type   2. Palpitations   3. Dyspnea on exertion    PLAN:    In order of problems listed above:  1. Chest discomfort, dyspnea and sensation that her heart is pounding even at rest.  Exercise treadmill test and 2D Doppler echocardiogram will be done to exclude cardiac structural abnormality and underlying ischemia to account for her symptoms.   Medication Adjustments/Labs and Tests Ordered: Current medicines are reviewed at length with the patient today.  Concerns regarding medicines are outlined above.  Orders Placed This Encounter  Procedures  . Exercise Tolerance Test  . ECHOCARDIOGRAM COMPLETE   No orders of the defined types were placed in this encounter.   Patient Instructions  Medication Instructions:  Your physician recommends that you continue on your current medications as directed. Please refer to the Current  Medication list given to you today.  Labwork: None  Testing/Procedures: Your physician has requested that you have an exercise tolerance test. For further information please visit [email or web address]. Please also follow instruction sheet, as given.  Your physician has requested that you have an echocardiogram. Echocardiography is a painless test that uses sound waves to create images of your heart. It provides your doctor with information about the size and shape of your heart and how well your heart's chambers and valves are working. This procedure takes approximately one hour. There are no restrictions for this procedure.   Follow-Up: Your physician recommends that you schedule a follow-up appointment as needed with Dr. Tamala Julian.    Any Other Special Instructions Will Be Listed Below (If Applicable).     If you need a refill on your cardiac medications before your next appointment, please call your pharmacy.      Signed, Sinclair Grooms, MD  10/21/2017 3:06 PM    [facility]

## 2017-10-28 ENCOUNTER — Ambulatory Visit (INDEPENDENT_AMBULATORY_CARE_PROVIDER_SITE_OTHER): Payer: 59

## 2017-10-28 ENCOUNTER — Ambulatory Visit (HOSPITAL_COMMUNITY): Payer: 59 | Attending: Cardiology

## 2017-10-28 ENCOUNTER — Other Ambulatory Visit: Payer: Self-pay

## 2017-10-28 DIAGNOSIS — I499 Cardiac arrhythmia, unspecified: Secondary | ICD-10-CM | POA: Insufficient documentation

## 2017-10-28 DIAGNOSIS — R079 Chest pain, unspecified: Secondary | ICD-10-CM

## 2017-10-28 DIAGNOSIS — J45909 Unspecified asthma, uncomplicated: Secondary | ICD-10-CM | POA: Insufficient documentation

## 2017-10-28 DIAGNOSIS — R5383 Other fatigue: Secondary | ICD-10-CM | POA: Diagnosis not present

## 2017-10-28 DIAGNOSIS — M549 Dorsalgia, unspecified: Secondary | ICD-10-CM | POA: Insufficient documentation

## 2017-10-28 DIAGNOSIS — G8929 Other chronic pain: Secondary | ICD-10-CM | POA: Insufficient documentation

## 2017-10-28 LAB — EXERCISE TOLERANCE TEST
CHL CUP RESTING HR STRESS: 72 {beats}/min
CSEPPHR: 162 {beats}/min
Estimated workload: 10.1 METS
Exercise duration (min): 9 min
Exercise duration (sec): 0 s
MPHR: 165 {beats}/min
Percent HR: 98 %
RPE: 17

## 2017-11-12 DIAGNOSIS — D225 Melanocytic nevi of trunk: Secondary | ICD-10-CM | POA: Diagnosis not present

## 2017-11-12 DIAGNOSIS — L820 Inflamed seborrheic keratosis: Secondary | ICD-10-CM | POA: Diagnosis not present

## 2017-11-12 DIAGNOSIS — I781 Nevus, non-neoplastic: Secondary | ICD-10-CM | POA: Diagnosis not present

## 2017-11-12 DIAGNOSIS — Z1283 Encounter for screening for malignant neoplasm of skin: Secondary | ICD-10-CM | POA: Diagnosis not present

## 2017-11-19 DIAGNOSIS — Z01419 Encounter for gynecological examination (general) (routine) without abnormal findings: Secondary | ICD-10-CM | POA: Diagnosis not present

## 2017-11-19 DIAGNOSIS — Z681 Body mass index (BMI) 19 or less, adult: Secondary | ICD-10-CM | POA: Diagnosis not present

## 2017-11-20 LAB — HM MAMMOGRAPHY

## 2017-12-09 ENCOUNTER — Other Ambulatory Visit: Payer: Self-pay | Admitting: Orthopedic Surgery

## 2017-12-24 ENCOUNTER — Other Ambulatory Visit: Payer: Self-pay

## 2017-12-24 ENCOUNTER — Encounter (HOSPITAL_BASED_OUTPATIENT_CLINIC_OR_DEPARTMENT_OTHER): Payer: Self-pay | Admitting: *Deleted

## 2017-12-31 ENCOUNTER — Ambulatory Visit (HOSPITAL_BASED_OUTPATIENT_CLINIC_OR_DEPARTMENT_OTHER)
Admission: RE | Admit: 2017-12-31 | Discharge: 2017-12-31 | Disposition: A | Discharge status: Home / Self Care | Payer: 59 | Source: Ambulatory Visit | Attending: Orthopedic Surgery | Admitting: Orthopedic Surgery

## 2017-12-31 ENCOUNTER — Ambulatory Visit (HOSPITAL_BASED_OUTPATIENT_CLINIC_OR_DEPARTMENT_OTHER): Payer: 59 | Admitting: Anesthesiology

## 2017-12-31 ENCOUNTER — Encounter (HOSPITAL_BASED_OUTPATIENT_CLINIC_OR_DEPARTMENT_OTHER): Payer: Self-pay | Admitting: *Deleted

## 2017-12-31 ENCOUNTER — Encounter (HOSPITAL_BASED_OUTPATIENT_CLINIC_OR_DEPARTMENT_OTHER)
Admission: RE | Disposition: A | Discharge status: Home / Self Care | Payer: Self-pay | Source: Ambulatory Visit | Attending: Orthopedic Surgery

## 2017-12-31 ENCOUNTER — Other Ambulatory Visit: Payer: Self-pay

## 2017-12-31 DIAGNOSIS — M67431 Ganglion, right wrist: Secondary | ICD-10-CM | POA: Diagnosis not present

## 2017-12-31 DIAGNOSIS — K589 Irritable bowel syndrome without diarrhea: Secondary | ICD-10-CM | POA: Insufficient documentation

## 2017-12-31 DIAGNOSIS — F419 Anxiety disorder, unspecified: Secondary | ICD-10-CM | POA: Insufficient documentation

## 2017-12-31 DIAGNOSIS — M8580 Other specified disorders of bone density and structure, unspecified site: Secondary | ICD-10-CM | POA: Insufficient documentation

## 2017-12-31 DIAGNOSIS — J45909 Unspecified asthma, uncomplicated: Secondary | ICD-10-CM | POA: Diagnosis not present

## 2017-12-31 DIAGNOSIS — E559 Vitamin D deficiency, unspecified: Secondary | ICD-10-CM | POA: Insufficient documentation

## 2017-12-31 DIAGNOSIS — K219 Gastro-esophageal reflux disease without esophagitis: Secondary | ICD-10-CM | POA: Insufficient documentation

## 2017-12-31 DIAGNOSIS — M419 Scoliosis, unspecified: Secondary | ICD-10-CM | POA: Diagnosis not present

## 2017-12-31 DIAGNOSIS — Z79899 Other long term (current) drug therapy: Secondary | ICD-10-CM | POA: Insufficient documentation

## 2017-12-31 HISTORY — PX: GANGLION CYST EXCISION: SHX1691

## 2017-12-31 SURGERY — EXCISION, GANGLION CYST, WRIST
Anesthesia: Monitor Anesthesia Care | Site: Wrist | Laterality: Right

## 2017-12-31 MED ORDER — MIDAZOLAM HCL 2 MG/2ML IJ SOLN
1.0000 mg | INTRAMUSCULAR | Status: DC | PRN
  Administered 2017-12-31: 2 mg via INTRAVENOUS

## 2017-12-31 MED ORDER — OXYCODONE HCL 5 MG/5ML PO SOLN: 5.0000 mg | Freq: Once | ORAL | Status: DC | PRN

## 2017-12-31 MED ORDER — MIDAZOLAM HCL 2 MG/2ML IJ SOLN
INTRAMUSCULAR | Status: AC
  Filled 2017-12-31: qty 2

## 2017-12-31 MED ORDER — BUPIVACAINE HCL (PF) 0.25 % IJ SOLN
INTRAMUSCULAR | Status: DC | PRN
  Administered 2017-12-31: 5.5 mL

## 2017-12-31 MED ORDER — FENTANYL CITRATE (PF) 100 MCG/2ML IJ SOLN: 25.0000 ug | INTRAMUSCULAR | Status: DC | PRN

## 2017-12-31 MED ORDER — OXYCODONE HCL 5 MG PO TABS: 5.0000 mg | ORAL_TABLET | Freq: Once | ORAL | Status: DC | PRN

## 2017-12-31 MED ORDER — ACETAMINOPHEN 325 MG PO TABS: 325.0000 mg | ORAL_TABLET | ORAL | Status: DC | PRN

## 2017-12-31 MED ORDER — SCOPOLAMINE 1 MG/3DAYS TD PT72: 1.0000 | MEDICATED_PATCH | Freq: Once | TRANSDERMAL | Status: DC | PRN

## 2017-12-31 MED ORDER — PROPOFOL 10 MG/ML IV BOLUS
INTRAVENOUS | Status: AC
  Filled 2017-12-31: qty 20

## 2017-12-31 MED ORDER — CEFAZOLIN SODIUM-DEXTROSE 2-4 GM/100ML-% IV SOLN
INTRAVENOUS | Status: AC
  Filled 2017-12-31: qty 100

## 2017-12-31 MED ORDER — PROPOFOL 10 MG/ML IV BOLUS
INTRAVENOUS | Status: DC | PRN
  Administered 2017-12-31: 20 mg via INTRAVENOUS

## 2017-12-31 MED ORDER — LACTATED RINGERS IV SOLN
INTRAVENOUS | Status: DC
  Administered 2017-12-31: 10:00:00 via INTRAVENOUS

## 2017-12-31 MED ORDER — FENTANYL CITRATE (PF) 100 MCG/2ML IJ SOLN
INTRAMUSCULAR | Status: AC
  Filled 2017-12-31: qty 2

## 2017-12-31 MED ORDER — LIDOCAINE HCL (PF) 0.5 % IJ SOLN
INTRAMUSCULAR | Status: DC | PRN
  Administered 2017-12-31: 30 mL via INTRAVENOUS

## 2017-12-31 MED ORDER — MEPERIDINE HCL 25 MG/ML IJ SOLN: 6.2500 mg | INTRAMUSCULAR | Status: DC | PRN

## 2017-12-31 MED ORDER — FENTANYL CITRATE (PF) 100 MCG/2ML IJ SOLN
50.0000 ug | INTRAMUSCULAR | Status: DC | PRN
  Administered 2017-12-31: 100 ug via INTRAVENOUS

## 2017-12-31 MED ORDER — PROPOFOL 500 MG/50ML IV EMUL
INTRAVENOUS | Status: DC | PRN
  Administered 2017-12-31: 75 ug/kg/min via INTRAVENOUS

## 2017-12-31 MED ORDER — HYDROCODONE-ACETAMINOPHEN 5-325 MG PO TABS: ORAL_TABLET | ORAL | 0 refills | Status: DC

## 2017-12-31 MED ORDER — ONDANSETRON HCL 4 MG/2ML IJ SOLN
INTRAMUSCULAR | Status: DC | PRN
  Administered 2017-12-31: 4 mg via INTRAVENOUS

## 2017-12-31 MED ORDER — ONDANSETRON HCL 4 MG/2ML IJ SOLN
INTRAMUSCULAR | Status: AC
  Filled 2017-12-31: qty 2

## 2017-12-31 MED ORDER — ACETAMINOPHEN 160 MG/5ML PO SOLN: 325.0000 mg | ORAL | Status: DC | PRN

## 2017-12-31 MED ORDER — ONDANSETRON HCL 4 MG/2ML IJ SOLN: 4.0000 mg | Freq: Once | INTRAMUSCULAR | Status: DC | PRN

## 2017-12-31 MED ORDER — CHLORHEXIDINE GLUCONATE 4 % EX LIQD: 60.0000 mL | Freq: Once | CUTANEOUS | Status: DC

## 2017-12-31 MED ORDER — CEFAZOLIN SODIUM-DEXTROSE 2-4 GM/100ML-% IV SOLN
2.0000 g | INTRAVENOUS | Status: AC
  Administered 2017-12-31: 2 g via INTRAVENOUS

## 2017-12-31 SURGICAL SUPPLY — 48 items
APL SKNCLS STERI-STRIP NONHPOA (GAUZE/BANDAGES/DRESSINGS) ×1
BANDAGE ACE 3X5.8 VEL STRL LF (GAUZE/BANDAGES/DRESSINGS) ×2 IMPLANT
BENZOIN TINCTURE PRP APPL 2/3 (GAUZE/BANDAGES/DRESSINGS) ×1 IMPLANT
BLADE MINI RND TIP GREEN BEAV (BLADE) IMPLANT
BLADE SURG 15 STRL LF DISP TIS (BLADE) ×2 IMPLANT
BLADE SURG 15 STRL SS (BLADE) ×4
BNDG CMPR 9X4 STRL LF SNTH (GAUZE/BANDAGES/DRESSINGS)
BNDG ELASTIC 2X5.8 VLCR STR LF (GAUZE/BANDAGES/DRESSINGS) IMPLANT
BNDG ESMARK 4X9 LF (GAUZE/BANDAGES/DRESSINGS) IMPLANT
BNDG GAUZE ELAST 4 BULKY (GAUZE/BANDAGES/DRESSINGS) ×2 IMPLANT
CHLORAPREP W/TINT 26ML (MISCELLANEOUS) ×2 IMPLANT
CORD BIPOLAR FORCEPS 12FT (ELECTRODE) ×2 IMPLANT
COVER BACK TABLE 60X90IN (DRAPES) ×2 IMPLANT
COVER MAYO STAND STRL (DRAPES) ×2 IMPLANT
COVER WAND RF STERILE (DRAPES) IMPLANT
CUFF TOURNIQUET SINGLE 18IN (TOURNIQUET CUFF) ×2 IMPLANT
DRAPE EXTREMITY T 121X128X90 (DRAPE) ×2 IMPLANT
DRAPE SURG 17X23 STRL (DRAPES) ×2 IMPLANT
DRSG PAD ABDOMINAL 8X10 ST (GAUZE/BANDAGES/DRESSINGS) ×1 IMPLANT
GAUZE SPONGE 4X4 12PLY STRL (GAUZE/BANDAGES/DRESSINGS) ×2 IMPLANT
GAUZE XEROFORM 1X8 LF (GAUZE/BANDAGES/DRESSINGS) ×1 IMPLANT
GLOVE BIO SURGEON STRL SZ 6.5 (GLOVE) ×1 IMPLANT
GLOVE BIO SURGEON STRL SZ7.5 (GLOVE) ×2 IMPLANT
GLOVE BIOGEL PI IND STRL 8 (GLOVE) ×1 IMPLANT
GLOVE BIOGEL PI INDICATOR 8 (GLOVE) ×1
GLOVE EXAM NITRILE MD LF STRL (GLOVE) ×1 IMPLANT
GOWN STRL REUS W/ TWL LRG LVL3 (GOWN DISPOSABLE) ×1 IMPLANT
GOWN STRL REUS W/TWL LRG LVL3 (GOWN DISPOSABLE) ×2
GOWN STRL REUS W/TWL XL LVL3 (GOWN DISPOSABLE) ×2 IMPLANT
NDL HYPO 25X1 1.5 SAFETY (NEEDLE) IMPLANT
NEEDLE HYPO 25X1 1.5 SAFETY (NEEDLE) ×2 IMPLANT
NS IRRIG 1000ML POUR BTL (IV SOLUTION) ×2 IMPLANT
PACK BASIN DAY SURGERY FS (CUSTOM PROCEDURE TRAY) ×2 IMPLANT
PAD CAST 3X4 CTTN HI CHSV (CAST SUPPLIES) IMPLANT
PADDING CAST ABS 4INX4YD NS (CAST SUPPLIES)
PADDING CAST ABS COTTON 4X4 ST (CAST SUPPLIES) ×1 IMPLANT
PADDING CAST COTTON 3X4 STRL (CAST SUPPLIES)
SPLINT PLASTER CAST XFAST 3X15 (CAST SUPPLIES) IMPLANT
SPLINT PLASTER XTRA FASTSET 3X (CAST SUPPLIES)
STOCKINETTE 4X48 STRL (DRAPES) ×2 IMPLANT
STRIP CLOSURE SKIN 1/2X4 (GAUZE/BANDAGES/DRESSINGS) ×1 IMPLANT
SUT MNCRL AB 4-0 PS2 18 (SUTURE) ×1 IMPLANT
SUT MON AB 5-0 PS2 18 (SUTURE) IMPLANT
SUT VIC AB 4-0 P2 18 (SUTURE) ×1 IMPLANT
SYR BULB 3OZ (MISCELLANEOUS) ×2 IMPLANT
SYR CONTROL 10ML LL (SYRINGE) ×1 IMPLANT
TOWEL GREEN STERILE FF (TOWEL DISPOSABLE) ×4 IMPLANT
UNDERPAD 30X30 (UNDERPADS AND DIAPERS) ×1 IMPLANT

## 2017-12-31 NOTE — H&P (Signed)
[name] is an 57 y.o. female.   Chief Complaint: right wrist dorsal ganglion HPI: 57 yo female with mass dorsum right wrist.  This is bothersome to her and she wishes to have it removed.  Allergies: No Known Allergies  Past Medical History:  Diagnosis Date  . Allergy   . Anxiety   . Asthma   . Chronic back pain   . Eosinophilic esophagitis   . Esophageal spasm   . Esophageal stricture   . Gastritis   . GERD (gastroesophageal reflux disease)   . Headache    occasional  . IBS (irritable bowel syndrome)   . Kidney stone 2015   gets every few years, hx/o calcium oxalate stones  . Osteopenia   . Scoliosis   . Seasonal allergic conjunctivitis   . Seasonal allergic rhinitis   . Small intestinal bacterial overgrowth   . Vitamin D deficiency   . Wears glasses     Past Surgical History:  Procedure Laterality Date  . APPENDECTOMY    . BREAST EXCISIONAL BIOPSY     left  . COLONOSCOPY  2013   [place], [place]  . ESOPHAGEAL DILATION     several times prior, [place], [place]  . ESOPHAGOPLASTY  07/9739   complication of esophageal dilitation, hospitalization at [place] Bilateral 09/14/2015   Procedure: INCISION AND DRAINAGE ABSCESS;  Surgeon: Leanora Cover, MD;  Location: [facility];  Service: Orthopedics;  Laterality: Bilateral;  . [place]  . LITHOTRIPSY     4x prior as of 05/2014    Family History: Family History  Problem Relation Age of Onset  . Diabetes Father   . Kidney Stones Father   . Hypertension Father   . Heart attack Father   . Heart disease Father   . Diabetes Sister   . Kidney Stones Brother   . Migraines Brother   . Heart disease Maternal Grandfather   . Cancer Neg Hx   . Stroke Neg Hx   . Colon cancer Neg Hx   . Esophageal cancer Neg Hx   . Stomach cancer Neg Hx     Social History:   reports that she has never smoked. She has never used smokeless tobacco. She reports that she  drinks about 7.0 standard drinks of alcohol per week. She reports that she does not use drugs.  Medications: Medications Prior to Admission  Medication Sig Dispense Refill  . albuterol (PROVENTIL HFA;VENTOLIN HFA) 108 (90 Base) MCG/ACT inhaler Inhale 2 puffs into the lungs every 6 (six) hours as needed for wheezing or shortness of breath. 1 Inhaler 1  . AMBULATORY NON FORMULARY MEDICATION Medication Name: Budesonide Slurry 2mg /73ml         Take 1mg  or 5cc by mouth BID for 6 weeks 420 mL 0  . fluticasone furoate-vilanterol (BREO ELLIPTA) 100-25 MCG/INH AEPB Inhale 1 puff into the lungs daily. 28 each 2  . LORazepam (ATIVAN) 0.5 MG tablet Take 1 tablet (0.5 mg total) by mouth 2 (two) times daily as needed for anxiety. 60 tablet 0  . pantoprazole (PROTONIX) 40 MG tablet TAKE 1 TABLET BY MOUTH EVERY DAY 30 tablet 2  . diclofenac (VOLTAREN) 75 MG EC tablet Take 1 tablet (75 mg total) by mouth 2 (two) times daily. (Patient taking differently: Take 75 mg by mouth 2 (two) times daily as needed. ) 45 tablet 2  . methocarbamol (ROBAXIN) 500 MG tablet Take 1 tablet (500 mg  total) by mouth 2 (two) times daily as needed (back pain). 45 tablet 2    No results found for this or any previous visit (from the past 48 hour(s)).  No results found.   A comprehensive review of systems was negative.  Blood pressure 120/69, pulse 68, temperature 97.8 F (36.6 C), temperature source Oral, resp. rate 16, height 5\' 2"  (1.575 m), weight 49.5 kg, SpO2 100 %.  General appearance: alert, cooperative and appears stated age Head: Normocephalic, without obvious abnormality, atraumatic Neck: supple, symmetrical, trachea midline Cardio: regular rate and rhythm Resp: clear to auscultation bilaterally Extremities: Intact sensation and capillary refill all digits.  +epl/fpl/io.  No wounds.  Pulses: 2+ and symmetric Skin: Skin color, texture, turgor normal. No rashes or lesions Neurologic: Grossly normal Incision/Wound:  none  Assessment/Plan Right wrist dorsal ganglion.  Non operative and operative treatment options have been discussed with the patient and patient wishes to proceed with operative treatment. Risks, benefits, and alternatives of surgery have been discussed and the patient agrees with the plan of care.   Leanora Cover 12/31/2017, 11:42 AM

## 2017-12-31 NOTE — Anesthesia Procedure Notes (Signed)
Anesthesia Regional Block: Bier block (IV Regional)   Pre-Anesthetic Checklist: ,, timeout performed, Correct Patient, Correct Site, Correct Laterality, Correct Procedure,, site marked, surgical consent,, at surgeon's request  Laterality: Right     Needles:  Injection technique: Single-shot  Needle Type: Other      Needle Gauge: 20     Additional Needles:   Procedures:,,,,, intact distal pulses, Esmarch exsanguination, single tourniquet utilized,  Narrative:   Performed by: Personally       

## 2017-12-31 NOTE — Anesthesia Preprocedure Evaluation (Addendum)
Anesthesia Evaluation  Patient identified by MRN, date of birth, ID band Patient awake    Reviewed: Allergy & Precautions, NPO status , Patient's Chart, lab work & pertinent test results  History of Anesthesia Complications Negative for: history of anesthetic complications  Airway Mallampati: I  TM Distance: >3 FB Neck ROM: Full    Dental  (+) Teeth Intact, Dental Advisory Given   Pulmonary asthma ,    breath sounds clear to auscultation       Cardiovascular negative cardio ROS   Rhythm:Regular Rate:Normal     Neuro/Psych  Headaches, Anxiety  Neuromuscular disease    GI/Hepatic Neg liver ROS, GERD  ,  Endo/Other  negative endocrine ROS  Renal/GU      Musculoskeletal   Abdominal   Peds  Hematology negative hematology ROS (+)   Anesthesia Other Findings   Reproductive/Obstetrics negative OB ROS                             Anesthesia Physical  Anesthesia Plan  ASA: III  Anesthesia Plan: MAC and Bier Block and Bier Block-LIDOCAINE ONLY   Post-op Pain Management:    Induction: Intravenous  PONV Risk Score and Plan: 2 and Treatment may vary due to age or medical condition  Airway Management Planned: Nasal Cannula and Natural Airway  Additional Equipment:   Intra-op Plan:   Post-operative Plan: Extubation in OR  Informed Consent: I have reviewed the patients History and Physical, chart, labs and discussed the procedure including the risks, benefits and alternatives for the proposed anesthesia with the patient or authorized representative who has indicated his/her understanding and acceptance.     Plan Discussed with: CRNA, Anesthesiologist and Surgeon  Anesthesia Plan Comments:         Anesthesia Quick Evaluation

## 2017-12-31 NOTE — Transfer of Care (Signed)
Immediate Anesthesia Transfer of Care Note  Patient: [name]  Procedure(s) Performed: EXCISION RIGHT WRIST DORSAL GANGLION (Right Wrist)  Patient Location: PACU  Anesthesia Type:MAC and Bier block  Level of Consciousness: awake, alert  and oriented  Airway & Oxygen Therapy: Patient Spontanous Breathing  Post-op Assessment: Report given to RN and Post -op Vital signs reviewed and stable  Post vital signs: Reviewed and stable  Last Vitals:  Vitals Value Taken Time  BP 117/60 12/31/2017 12:52 PM  Temp    Pulse 80 12/31/2017 12:54 PM  Resp 17 12/31/2017 12:54 PM  SpO2 99 % 12/31/2017 12:54 PM  Vitals shown include unvalidated device data.  Last Pain:  Vitals:   12/31/17 0947  TempSrc: Oral  PainSc: 1       Patients Stated Pain Goal: 0 (38/10/17 5102)  Complications: No apparent anesthesia complications

## 2017-12-31 NOTE — Anesthesia Postprocedure Evaluation (Signed)
Anesthesia Post Note  Patient: [name]  Procedure(s) Performed: EXCISION RIGHT WRIST DORSAL GANGLION (Right Wrist)     Patient location during evaluation: PACU Anesthesia Type: MAC Level of consciousness: awake and alert Pain management: pain level controlled Vital Signs Assessment: post-procedure vital signs reviewed and stable Respiratory status: spontaneous breathing, nonlabored ventilation, respiratory function stable and patient connected to nasal cannula oxygen Cardiovascular status: stable and blood pressure returned to baseline Postop Assessment: no apparent nausea or vomiting Anesthetic complications: no    Last Vitals:  Vitals:   12/31/17 1255 12/31/17 1300  BP:  121/69  Pulse: 78 69  Resp: 12 11  Temp:    SpO2: 100% 98%    Last Pain:  Vitals:   12/31/17 1300  TempSrc:   PainSc: 0-No pain                 Riah Kehoe

## 2017-12-31 NOTE — Discharge Instructions (Addendum)

## 2017-12-31 NOTE — Op Note (Signed)
NAME: [name] MEDICAL RECORD NO: [id number] DATE OF BIRTH: 09/18/1960 FACILITY: [facility] LOCATION: [facility] PHYSICIAN: Tennis Must, MD   OPERATIVE REPORT   DATE OF PROCEDURE: 12/31/17    PREOPERATIVE DIAGNOSIS:   Right wrist dorsal ganglion   POSTOPERATIVE DIAGNOSIS:   Right wrist dorsal ganglion   PROCEDURE:   Excision of right wrist dorsal ganglion   SURGEON:  Leanora Cover, M.D.   ASSISTANT: none   ANESTHESIA:  Bier block with sedation   INTRAVENOUS FLUIDS:  Per anesthesia flow sheet.   ESTIMATED BLOOD LOSS:  Minimal.   COMPLICATIONS:  None.   SPECIMENS:   Right wrist ganglion to pathology   TOURNIQUET TIME:    Total Tourniquet Time Documented: Forearm (Right) - 39 minutes Total: Forearm (Right) - 39 minutes    DISPOSITION:  Stable to PACU.   INDICATIONS: 57 year old female with mass on the dorsum of the right wrist.  This is bothersome to her.  She wishes to have it removed. Risks, benefits and alternatives of surgery were discussed including the risks of blood loss, infection, damage to nerves, vessels, tendons, ligaments, bone for surgery, need for additional surgery, complications with wound healing, continued pain, nonunion, malunion, stiffness.  She voiced understanding of these risks and elected to proceed.  OPERATIVE COURSE:  After being identified preoperatively by myself,  the patient and I agreed on the procedure and site of the procedure.  The surgical site was marked.  Surgical consent had been signed. She was given IV Ancef as preoperative antibiotic prophylaxis. She was transferred to the operating room and placed on the operating table in supine position with the Right upper extremity on an arm board.  Bier block anesthesia was induced by the anesthesiologist.  Right upper extremity was prepped and draped in normal sterile orthopedic fashion.  A surgical pause was performed between the surgeons, anesthesia, and operating room staff  and all were in agreement as to the patient, procedure, and site of procedure.  Tourniquet at the proximal aspect of the forearm had been inflated for the Bier block.    A transverse incision was made over the mass on the dorsum of the wrist.  This was carried into the subcutaneous tissues by spreading technique.  There is no distinct ganglion.  It appeared to be a large sessile ganglion between the wrist extensors and digital extensors.  The dorsal capsule was patulous.  What appeared to be the ganglion was excised.  The remaining portion of the capsule was then reapproximated using 4-0 Vicryl suture in a figure-of-eight fashion.  Good reapproximation was obtained.  The wound was copiously irrigated with sterile saline.  2 inverted interrupted Vicryl sutures placed in subtenons tissues and the skin was closed with a running subarticular 4-0 Monocryl suture.  This was augmented with benzoin and Steri-Strips.  The wound was injected with quarter percent plain Marcaine to aid in postoperative analgesia.  He was then dressed with sterile 4 x 4's and ABD and wrapped with Kerlix and Ace bandage.  The tourniquet was deflated at 39 minutes.  Fingertips were pink with brisk capillary refill after deflation of tourniquet.  The operative  drapes were broken down.  The patient was awoken from anesthesia safely.  She was transferred back to the stretcher and taken to PACU in stable condition.  I will see her back in the office in 1 week for postoperative followup.  I will give her a prescription for Norco 5/325 1-2 tabs PO q6 hours  prn pain, dispense # 20.   Leanora Cover, MD Electronically signed, 12/31/17

## 2017-12-31 NOTE — Anesthesia Procedure Notes (Signed)
Procedure Name: MAC Date/Time: 12/31/2017 12:30 PM Performed by: Maryella Shivers, CRNA Pre-anesthesia Checklist: Patient identified, Emergency Drugs available, Suction available, Patient being monitored and Timeout performed Patient Re-evaluated:Patient Re-evaluated prior to induction Oxygen Delivery Method: Simple face mask

## 2018-01-01 ENCOUNTER — Encounter: Payer: 59 | Admitting: Medical

## 2018-01-01 ENCOUNTER — Encounter (HOSPITAL_BASED_OUTPATIENT_CLINIC_OR_DEPARTMENT_OTHER): Payer: Self-pay | Admitting: Orthopedic Surgery

## 2018-01-07 ENCOUNTER — Telehealth: Payer: Self-pay | Admitting: Internal Medicine

## 2018-01-07 NOTE — Telephone Encounter (Signed)
Patient indicates that she is again having difficulty with pain in her esophagus. She states that she was originally only able to take budesonide slurry once daily (starting back in July) because if she took it in the evenings, it made her heart race and she would be unable to sleep at night. She also indicates that that she got several refills of the budesonide and that she felt better when taking the medication. However, since running out of the medication, she has started having symptoms all over again. Dr Hilarie Fredrickson, please advise.Marland KitchenMarland KitchenMarland Kitchen

## 2018-01-07 NOTE — Telephone Encounter (Signed)
Pt called looking for rf for [facility]. She stated that her pharmacy told her that we declined refill order.

## 2018-01-07 NOTE — Telephone Encounter (Signed)
DNS,  Would resume budesonide, once daily dosing x 8 weeks, refill x 3 Known EoE

## 2018-01-08 MED ORDER — AMBULATORY NON FORMULARY MEDICATION: 3 refills | Status: DC

## 2018-01-08 NOTE — Telephone Encounter (Signed)
I have spoken to patient to advise of Dr Vena Rua recommendations. She verbalizes understanding. Rx sent to [facility] (since this is a compounded medication).

## 2018-01-12 MED ORDER — AMBULATORY NON FORMULARY MEDICATION: 3 refills | Status: DC

## 2018-01-12 NOTE — Telephone Encounter (Signed)
Rx faxed to [facility] at [phone]

## 2018-01-12 NOTE — Telephone Encounter (Signed)
Patient states she needed [facility] due to cost.

## 2018-01-12 NOTE — Addendum Note (Signed)
Addended by: Larina Bras on: 01/12/2018 05:01 PM   Modules accepted: Orders

## 2018-01-20 ENCOUNTER — Encounter: Payer: Self-pay | Admitting: Medical

## 2018-01-20 ENCOUNTER — Ambulatory Visit (INDEPENDENT_AMBULATORY_CARE_PROVIDER_SITE_OTHER): Payer: 59 | Admitting: Medical

## 2018-01-20 VITALS — BP 110/70 | HR 55 | Temp 97.7°F | Resp 16 | Ht 63.0 in | Wt 109.2 lb

## 2018-01-20 DIAGNOSIS — R319 Hematuria, unspecified: Secondary | ICD-10-CM

## 2018-01-20 DIAGNOSIS — Z7189 Other specified counseling: Secondary | ICD-10-CM

## 2018-01-20 DIAGNOSIS — E559 Vitamin D deficiency, unspecified: Secondary | ICD-10-CM

## 2018-01-20 DIAGNOSIS — Z7185 Encounter for immunization safety counseling: Secondary | ICD-10-CM

## 2018-01-20 DIAGNOSIS — R0789 Other chest pain: Secondary | ICD-10-CM

## 2018-01-20 DIAGNOSIS — Z0000 Encounter for general adult medical examination without abnormal findings: Secondary | ICD-10-CM | POA: Diagnosis not present

## 2018-01-20 DIAGNOSIS — R0609 Other forms of dyspnea: Secondary | ICD-10-CM | POA: Diagnosis not present

## 2018-01-20 DIAGNOSIS — M8580 Other specified disorders of bone density and structure, unspecified site: Secondary | ICD-10-CM

## 2018-01-20 DIAGNOSIS — K589 Irritable bowel syndrome without diarrhea: Secondary | ICD-10-CM

## 2018-01-20 DIAGNOSIS — J4540 Moderate persistent asthma, uncomplicated: Secondary | ICD-10-CM

## 2018-01-20 DIAGNOSIS — R0600 Dyspnea, unspecified: Secondary | ICD-10-CM

## 2018-01-20 DIAGNOSIS — F419 Anxiety disorder, unspecified: Secondary | ICD-10-CM

## 2018-01-20 DIAGNOSIS — J301 Allergic rhinitis due to pollen: Secondary | ICD-10-CM

## 2018-01-20 LAB — POCT URINALYSIS DIP (PROADVANTAGE DEVICE)
BILIRUBIN UA: NEGATIVE
Glucose, UA: NEGATIVE mg/dL
Ketones, POC UA: NEGATIVE mg/dL
LEUKOCYTES UA: NEGATIVE
NITRITE UA: NEGATIVE
PH UA: 6 (ref 5.0–8.0)
PROTEIN UA: NEGATIVE mg/dL
Specific Gravity, Urine: 1.025
Urobilinogen, Ur: NEGATIVE

## 2018-01-20 MED ORDER — MONTELUKAST SODIUM 10 MG PO TABS: 10.0000 mg | ORAL_TABLET | Freq: Every day | ORAL | 3 refills | Status: DC

## 2018-01-20 MED ORDER — LORAZEPAM 0.5 MG PO TABS: 0.5000 mg | ORAL_TABLET | Freq: Two times a day (BID) | ORAL | 1 refills | Status: DC | PRN

## 2018-01-20 MED ORDER — ALBUTEROL SULFATE HFA 108 (90 BASE) MCG/ACT IN AERS: 2.0000 | INHALATION_SPRAY | Freq: Four times a day (QID) | RESPIRATORY_TRACT | 1 refills | Status: DC | PRN

## 2018-01-20 MED ORDER — FLUTICASONE FUROATE-VILANTEROL 200-25 MCG/INH IN AEPB: 1.0000 | INHALATION_SPRAY | Freq: Every day | RESPIRATORY_TRACT | 2 refills | Status: DC

## 2018-01-20 NOTE — Patient Instructions (Signed)
Recommendations Strongly recommend he have a yearly flu shot, pneumococcal 23 vaccine and shingles vaccine.  Shingles vaccine:  I recommend you have a shingles vaccine to help prevent shingles or herpes zoster outbreak.   Please call your insurer to inquire about coverage for the Shingrix vaccine given in 2 doses.   Some insurers cover this vaccine after age 56, some cover this after age 62.  If your insurer covers this, then call to schedule appointment to have this vaccine here.  We will request copies of your mammogram and Pap smear from gynecology  Begin trial of Singulair tablet daily at bedtime to help with asthma and allergies  Let us increase to Breo inhaler 200/25 mcg 1 puff daily instead of the lower dose you are currently taking  I want you to do a consistent exercise daily for the next 2 weeks such as 30 minutes on the treadmill at a certain level such as 4 or 5 level and see how you tolerate this over the next 2 weeks with changing the asthma treatment  We will call with lab results  Make sure you are getting 1200 mg of calcium daily through supplements or diet as well as thousand units of vitamin D daily along with seafood or fish in the diet  See your dentist and eye doctor yearly  You are due for colonoscopy in 2020

## 2018-01-20 NOTE — Progress Notes (Signed)
Subjective:   HPI  [name] is a 57 y.o. female who presents for a complete physical.  Medical care team includes:  Gyn - Dr. Alfred Levins, [facility]  GI - Dr. Zenovia Jarred  Dr. Junious Silk - urology  Eye doctor and dentist  dermatology  Tysinger, Camelia Eng, PA-C here for primary care  Concerns: Declines flu shot  08/2016 osteopenia  Asthma - still has tightness at times.  Using breo daily, albuterol prn . Gets tightness without wheezes.   Prior prevention inhaler included flovent.   Using zyrtec some.  Worse months for asthma is May and November  Feels that anxiety is part of her chest tightness  Sees GI.  Recently got improvement in her eosinophilic esophagitis with cocktail from GI  No other recent issues.  Reviewed their medical, surgical, family, social, medication, and allergy history and updated chart as appropriate.  Past Medical History:  Diagnosis Date  . Allergy   . Anxiety   . Asthma   . Chronic back pain   . Eosinophilic esophagitis   . Esophageal spasm   . Esophageal stricture   . Gastritis   . GERD (gastroesophageal reflux disease)   . Headache    occasional  . IBS (irritable bowel syndrome)   . Kidney stone 2015   gets every few years, hx/o calcium oxalate stones  . Osteopenia   . Scoliosis   . Seasonal allergic conjunctivitis   . Seasonal allergic rhinitis   . Small intestinal bacterial overgrowth   . Vitamin D deficiency   . Wears glasses     Past Surgical History:  Procedure Laterality Date  . APPENDECTOMY    . BREAST EXCISIONAL BIOPSY     left  . COLONOSCOPY  2013   [place], [place]  . ESOPHAGEAL DILATION     several times prior, [place], [place]  . ESOPHAGOPLASTY  11/9355   complication of esophageal dilitation, hospitalization at [facility] 12/31/2017   Procedure: EXCISION RIGHT WRIST DORSAL GANGLION;  Surgeon: Leanora Cover, MD;  Location: [facility];  Service: Orthopedics;   Laterality: Right;  Bier block  . INCISION AND DRAINAGE ABSCESS Bilateral 09/14/2015   Procedure: INCISION AND DRAINAGE ABSCESS;  Surgeon: Leanora Cover, MD;  Location: [facility];  Service: Orthopedics;  Laterality: Bilateral;  . [place]  . LITHOTRIPSY     4x prior as of 05/2014    Social History   Socioeconomic History  . Marital status: Single    Spouse name: Not on file  . Number of children: 0  . Years of education: Not on file  . Highest education level: Not on file  Occupational History  . Occupation: Tax adviser    Comment: Midwife  Social Needs  . Financial resource strain: Not on file  . Food insecurity:    Worry: Not on file    Inability: Not on file  . Transportation needs:    Medical: Not on file    Non-medical: Not on file  Tobacco Use  . Smoking status: Never Smoker  . Smokeless tobacco: Never Used  Substance and Sexual Activity  . Alcohol use: Yes    Alcohol/week: 7.0 standard drinks    Types: 7 Glasses of wine per week  . Drug use: No  . Sexual activity: Not on file  Lifestyle  . Physical activity:    Days per week: Not on file    Minutes  per session: Not on file  . Stress: Not on file  Relationships  . Social connections:    Talks on phone: Not on file    Gets together: Not on file    Attends religious service: Not on file    Active member of club or organization: Not on file    Attends meetings of clubs or organizations: Not on file    Relationship status: Not on file  . Intimate partner violence:    Fear of current or ex partner: Not on file    Emotionally abused: Not on file    Physically abused: Not on file    Forced sexual activity: Not on file  Other Topics Concern  . Not on file  Social History Narrative   2 dogs, muts [place] and [place], exercise 3-4 days per week with walking, Yoga, weights, CPA.  Customer service manager at [employer]    Family History  Problem Relation Age  of Onset  . Diabetes Father   . Kidney Stones Father   . Hypertension Father   . Heart attack Father   . Heart disease Father   . Diabetes Sister   . Kidney Stones Brother   . Migraines Brother   . Heart disease Maternal Grandfather   . Cancer Neg Hx   . Stroke Neg Hx   . Colon cancer Neg Hx   . Esophageal cancer Neg Hx   . Stomach cancer Neg Hx      Current Outpatient Medications:  .  albuterol (PROVENTIL HFA;VENTOLIN HFA) 108 (90 Base) MCG/ACT inhaler, Inhale 2 puffs into the lungs every 6 (six) hours as needed for wheezing or shortness of breath., Disp: 1 Inhaler, Rfl: 1 .  diclofenac (VOLTAREN) 75 MG EC tablet, Take 1 tablet (75 mg total) by mouth 2 (two) times daily. (Patient taking differently: Take 75 mg by mouth 2 (two) times daily as needed. ), Disp: 45 tablet, Rfl: 2 .  LORazepam (ATIVAN) 0.5 MG tablet, Take 1 tablet (0.5 mg total) by mouth 2 (two) times daily as needed for anxiety., Disp: 60 tablet, Rfl: 1 .  methocarbamol (ROBAXIN) 500 MG tablet, Take 1 tablet (500 mg total) by mouth 2 (two) times daily as needed (back pain)., Disp: 45 tablet, Rfl: 2 .  pantoprazole (PROTONIX) 40 MG tablet, TAKE 1 TABLET BY MOUTH EVERY DAY, Disp: 30 tablet, Rfl: 2 .  AMBULATORY NON FORMULARY MEDICATION, Medication Name: Budesonide Slurry 2mg /41ml         Take 1mg  or 5cc by mouth once daily for 8 weeks, Disp: 300 mL, Rfl: 3 .  fluticasone furoate-vilanterol (BREO ELLIPTA) 200-25 MCG/INH AEPB, Inhale 1 puff into the lungs daily., Disp: 1 each, Rfl: 2 .  HYDROcodone-acetaminophen (NORCO) 5-325 MG tablet, 1-2 tabs po q6 hours prn pain (Patient not taking: Reported on 01/20/2018), Disp: 20 tablet, Rfl: 0 .  montelukast (SINGULAIR) 10 MG tablet, Take 1 tablet (10 mg total) by mouth at bedtime., Disp: 30 tablet, Rfl: 3  No Known Allergies   Review of Systems Constitutional: -fever, -chills, -sweats, -unexpected weight change, -decreased appetite, -fatigue Allergy: -sneezing, -itching,  -congestion Dermatology: -changing moles, --rash, -lumps ENT: -runny nose, -ear pain, -sore throat, -hoarseness, -sinus pain, -teeth pain, - ringing in ears, -hearing loss, -nosebleeds Cardiology: -chest pain, -palpitations, -swelling, -difficulty breathing when lying flat, -waking up short of breath Respiratory: -cough, +shortness of breath, -difficulty breathing with exercise or exertion, -wheezing, -coughing up blood Gastroenterology: -abdominal pain, -nausea, -vomiting, -diarrhea, -constipation, -blood in stool, -changes in  bowel movement, -difficulty swallowing or eating Hematology: -bleeding, -bruising  Musculoskeletal: -joint aches, -muscle aches, -joint swelling, +back pain, -neck pain, -cramping, -changes in gait Ophthalmology: denies vision changes, eye redness, itching, discharge Urology: -burning with urination, -difficulty urinating, -blood in urine, -urinary frequency, -urgency, -incontinence Neurology: -headache, -weakness, -tingling, -numbness, -memory loss, -falls, -dizziness Psychology: -depressed mood, -agitation, -sleep problems      Objective:   Physical Exam  BP 110/70   Pulse (!) 55   Temp 97.7 F (36.5 C) (Oral)   Resp 16   Ht 5\' 3"  (1.6 m)   Wt 109 lb 3.2 oz (49.5 kg)   SpO2 98%   BMI 19.34 kg/m   Wt Readings from Last 3 Encounters:  01/20/18 109 lb 3.2 oz (49.5 kg)  12/31/17 109 lb 2 oz (49.5 kg)  10/21/17 108 lb (49 kg)   General appearance: alert, no distress, WD/WN, lean white female Skin: scattered macules, left forearm distally with linear small surgical scar, few scattered roundish scars on left forearm from prior cryotherapy, no worrisome lesions HEENT: normocephalic, conjunctiva/corneas normal, sclerae anicteric, PERRLA, EOMi, nares patent, no discharge or erythema, pharynx normal Oral cavity: MMM, tongue normal, teeth normal Neck: supple, no lymphadenopathy, no thyromegaly, no masses, normal ROM, no bruits Chest: non tender, normal shape and  expansion Heart: RRR, normal S1, S2, no murmurs Lungs: CTA bilaterally, no wheezes, rhonchi, or rales Abdomen: +bs, port surgical scar inferior umbilicus, soft, non tender, non distended, no masses, no hepatomegaly, no splenomegaly, no bruits Back: non tender, normal ROM, +moderate scoliosis Musculoskeletal: upper extremities non tender, no obvious deformity, normal ROM throughout, lower extremities non tender, no obvious deformity, normal ROM throughout Extremities: no edema, no cyanosis, no clubbing Pulses: 2+ symmetric, upper and lower extremities, normal cap refill Neurological: alert, oriented x 3, CN2-12 intact, strength normal upper extremities and lower extremities, sensation normal throughout, DTRs 2+ throughout, no cerebellar signs, gait normal Psychiatric: normal affect, behavior normal, pleasant  Breast/gyn/rectal - deferred to gyn    Assessment and Plan :    Encounter Diagnoses  Name Primary?  . Routine general medical examination at a health care facility Yes  . Vaccine counseling   . Vitamin D deficiency   . DOE (dyspnea on exertion)   . Anxiety   . Osteopenia, unspecified location   . Moderate persistent asthma without complication   . Allergic rhinitis due to pollen, unspecified seasonality   . Irritable bowel syndrome, unspecified type   . Chest tightness     Physical exam - discussed healthy lifestyle, diet, exercise, preventative care, vaccinations, and addressed their concerns.  Routine labs today See your eye doctor yearly for routine vision care. See your dentist yearly for routine dental care including hygiene visits twice yearly. See your gynecologist yearly for routine gynecological care.  We will have her sign for records from last pap and mammogram  gerd- managed by GI  Asthma - PFT reviewed.  Increase dose of Breo, add Singulair, continue albuterol as needed.  Call back in 2 weeks to give me an update on symptoms  Chest tightness-multifactorial,  likely related to asthma plus GERD plus anxiety.  We discussed specific exercises the next 2 weeks to see how her lung exercise tolerance goes with call back in 2 weeks.  I reviewed her recent echocardiogram which is normal.  I reviewed her July 2019 cardiology consult with Dr. Daneen Schick.  Osteopenia - reviewed 2016 Bone Density scan.   C/t routine exercise, healthy diet.  discussed Ca +  Vit regularly. Risk factors include weight less than 27 lb, hx/o fracture as adult, Vit D deficiency, white female, on GERD medication, menopausal.  Anxiety - counseled on ways to deal with anxiety, c/t ativan prn  Counseled on shingrix, pneumococcal and flu shots.  She declines all 3.  Follow-up pending labs  Mandisa was seen today for cpe.  Diagnoses and all orders for this visit:  Routine general medical examination at a health care facility -     POCT Urinalysis DIP (Proadvantage Device) -     Comprehensive metabolic panel -     CBC with Differential/Platelet -     VITAMIN D 25 Hydroxy (Vit-D Deficiency, Fractures) -     TSH  Vaccine counseling  Vitamin D deficiency -     VITAMIN D 25 Hydroxy (Vit-D Deficiency, Fractures)  DOE (dyspnea on exertion)  Anxiety  Osteopenia, unspecified location  Moderate persistent asthma without complication -     Spirometry with Graph  Allergic rhinitis due to pollen, unspecified seasonality  Irritable bowel syndrome, unspecified type  Chest tightness  Other orders -     montelukast (SINGULAIR) 10 MG tablet; Take 1 tablet (10 mg total) by mouth at bedtime. -     fluticasone furoate-vilanterol (BREO ELLIPTA) 200-25 MCG/INH AEPB; Inhale 1 puff into the lungs daily. -     LORazepam (ATIVAN) 0.5 MG tablet; Take 1 tablet (0.5 mg total) by mouth 2 (two) times daily as needed for anxiety. -     albuterol (PROVENTIL HFA;VENTOLIN HFA) 108 (90 Base) MCG/ACT inhaler; Inhale 2 puffs into the lungs every 6 (six) hours as needed for wheezing or shortness of  breath.

## 2018-01-20 NOTE — Addendum Note (Signed)
Addended by: Gwinda Maine on: 01/20/2018 03:39 PM   Modules accepted: Orders

## 2018-01-21 LAB — CBC WITH DIFFERENTIAL/PLATELET
BASOS ABS: 0.1 10*3/uL (ref 0.0–0.2)
Basos: 1 %
EOS (ABSOLUTE): 0.2 10*3/uL (ref 0.0–0.4)
Eos: 4 %
HEMOGLOBIN: 13.3 g/dL (ref 11.1–15.9)
Hematocrit: 39.2 % (ref 34.0–46.6)
IMMATURE GRANS (ABS): 0 10*3/uL (ref 0.0–0.1)
IMMATURE GRANULOCYTES: 0 %
LYMPHS: 36 %
Lymphocytes Absolute: 2 10*3/uL (ref 0.7–3.1)
MCH: 31.7 pg (ref 26.6–33.0)
MCHC: 33.9 g/dL (ref 31.5–35.7)
MCV: 93 fL (ref 79–97)
MONOCYTES: 6 %
Monocytes Absolute: 0.4 10*3/uL (ref 0.1–0.9)
NEUTROS ABS: 2.9 10*3/uL (ref 1.4–7.0)
NEUTROS PCT: 53 %
PLATELETS: 372 10*3/uL (ref 150–450)
RBC: 4.2 x10E6/uL (ref 3.77–5.28)
RDW: 12.3 % (ref 12.3–15.4)
WBC: 5.5 10*3/uL (ref 3.4–10.8)

## 2018-01-21 LAB — VITAMIN D 25 HYDROXY (VIT D DEFICIENCY, FRACTURES): VIT D 25 HYDROXY: 42.2 ng/mL (ref 30.0–100.0)

## 2018-01-21 LAB — URINALYSIS, MICROSCOPIC ONLY
Bacteria, UA: NONE SEEN
CASTS: NONE SEEN /LPF

## 2018-01-21 LAB — COMPREHENSIVE METABOLIC PANEL
A/G RATIO: 2.1 (ref 1.2–2.2)
ALT: 20 IU/L (ref 0–32)
AST: 22 IU/L (ref 0–40)
Albumin: 4.9 g/dL (ref 3.5–5.5)
Alkaline Phosphatase: 89 IU/L (ref 39–117)
BUN/Creatinine Ratio: 18 (ref 9–23)
BUN: 16 mg/dL (ref 6–24)
Bilirubin Total: 0.4 mg/dL (ref 0.0–1.2)
CALCIUM: 10.2 mg/dL (ref 8.7–10.2)
CO2: 26 mmol/L (ref 20–29)
CREATININE: 0.91 mg/dL (ref 0.57–1.00)
Chloride: 103 mmol/L (ref 96–106)
GFR calc non Af Amer: 71 mL/min/{1.73_m2} (ref >59)
GFR, EST AFRICAN AMERICAN: 82 mL/min/{1.73_m2} (ref >59)
Globulin, Total: 2.3 g/dL (ref 1.5–4.5)
Glucose: 86 mg/dL (ref 65–99)
POTASSIUM: 4.7 mmol/L (ref 3.5–5.2)
Sodium: 144 mmol/L (ref 134–144)
TOTAL PROTEIN: 7.2 g/dL (ref 6.0–8.5)

## 2018-01-21 LAB — TSH: TSH: 1.56 u[IU]/mL (ref 0.450–4.500)

## 2018-01-26 LAB — LIPID PANEL W/O CHOL/HDL RATIO
Cholesterol, Total: 216 mg/dL — ABNORMAL HIGH (ref 100–199)
HDL: 97 mg/dL (ref >39)
LDL Calculated: 106 mg/dL — ABNORMAL HIGH (ref 0–99)
TRIGLYCERIDES: 67 mg/dL (ref 0–149)
VLDL Cholesterol Cal: 13 mg/dL (ref 5–40)

## 2018-01-26 LAB — SPECIMEN STATUS REPORT

## 2018-01-27 DIAGNOSIS — N760 Acute vaginitis: Secondary | ICD-10-CM | POA: Diagnosis not present

## 2018-02-04 ENCOUNTER — Telehealth: Payer: Self-pay | Admitting: Medical

## 2018-02-04 ENCOUNTER — Telehealth: Payer: Self-pay | Admitting: Internal Medicine

## 2018-02-04 NOTE — Telephone Encounter (Signed)
Pt called in and states that she has dry mouth, blurry vision, anxious, shaky, headache. Was prescribed last year and never took med until today. Seen counselor the other day and was advised it might be good to take this med. She took it today.   Per vickie, pt was advised to take ativan to see if this calms her down. If it does not then she would need to be seen for symptoms. She states she is never taking another pill again. Pt was also advised that if she felt she needed to be on something then she would need to come in as well

## 2018-02-09 NOTE — Telephone Encounter (Signed)
Error

## 2018-02-16 ENCOUNTER — Telehealth: Payer: Self-pay | Admitting: Medical

## 2018-02-16 NOTE — Telephone Encounter (Signed)
Records received from [facility]. Received pap and mammogram. Sending back for review.

## 2018-02-17 NOTE — Telephone Encounter (Signed)
Scan and abstract mammogram

## 2018-03-05 ENCOUNTER — Telehealth: Payer: Self-pay | Admitting: Medical

## 2018-03-05 NOTE — Telephone Encounter (Signed)
Pt advised. KH 

## 2018-03-05 NOTE — Telephone Encounter (Signed)
Heat to the area and Tylenol for pain relief.

## 2018-03-05 NOTE — Telephone Encounter (Signed)
  Please call pt  She hit her calf about a week ago and she has a bruise there, which she expected but she states she has a vein there that is big and the vein actually hurts Wants to know if she should be concerned or what can she do

## 2018-03-11 ENCOUNTER — Encounter: Payer: Self-pay | Admitting: Internal Medicine

## 2018-03-23 ENCOUNTER — Other Ambulatory Visit: Payer: Self-pay | Admitting: Internal Medicine

## 2018-05-03 DIAGNOSIS — N200 Calculus of kidney: Secondary | ICD-10-CM | POA: Diagnosis not present

## 2018-06-14 ENCOUNTER — Telehealth: Payer: Self-pay | Admitting: Medical

## 2018-06-14 NOTE — Telephone Encounter (Signed)
Pt called and said she has been taking her Breo and singular and is still having trouble breathing. She is having no other symptoms and feels like it may be allergies. Shes wondering is she needs a steroid or something else.

## 2018-06-15 ENCOUNTER — Telehealth (INDEPENDENT_AMBULATORY_CARE_PROVIDER_SITE_OTHER): Payer: 59 | Admitting: Medical

## 2018-06-15 ENCOUNTER — Other Ambulatory Visit: Payer: Self-pay

## 2018-06-15 ENCOUNTER — Other Ambulatory Visit: Payer: Self-pay | Admitting: Medical

## 2018-06-15 ENCOUNTER — Telehealth: Payer: Self-pay | Admitting: Medical

## 2018-06-15 DIAGNOSIS — J4541 Moderate persistent asthma with (acute) exacerbation: Secondary | ICD-10-CM

## 2018-06-15 MED ORDER — FLUTICASONE FUROATE-VILANTEROL 200-25 MCG/INH IN AEPB: 1.0000 | INHALATION_SPRAY | Freq: Every day | RESPIRATORY_TRACT | 5 refills | Status: DC

## 2018-06-15 MED ORDER — BUDESONIDE 0.5 MG/2ML IN SUSP: 0.5000 mg | Freq: Two times a day (BID) | RESPIRATORY_TRACT | 3 refills | Status: DC

## 2018-06-15 MED ORDER — ALBUTEROL SULFATE HFA 108 (90 BASE) MCG/ACT IN AERS: 2.0000 | INHALATION_SPRAY | Freq: Four times a day (QID) | RESPIRATORY_TRACT | 1 refills | Status: DC | PRN

## 2018-06-15 MED ORDER — ALBUTEROL SULFATE (2.5 MG/3ML) 0.083% IN NEBU: 2.5000 mg | INHALATION_SOLUTION | Freq: Two times a day (BID) | RESPIRATORY_TRACT | 3 refills | Status: DC

## 2018-06-15 NOTE — Progress Notes (Signed)
Documentation for Telephone encounter:  This telephone service is not related to other E/M service within previous 7 days.  Patient consented to the consult.  This telephone consult involved patient and myself, Dorothea Ogle PA-C.  Subjective: She has a hx/o asthma, already taking Breo inhaler daily, albuterol several times daily of late, taking Singulair at bedtime.  For the past week has had difficulty getting a full breath, is having some runny nose, itchy eyes, but main symptom is dyspnea.   No fever, no body aches, no chills, no recent illness.  No sick exposures , no recent travel.   No recent surgery or trauma.  Wants advice on other therapy to help dyspnea.   Last allergist consult was 4-5 years ago.  Has hx/o eosinophilic esophagitis, sees GI.   No other aggravating or relieving factors. No other complaint.  Objective: Gen: nad, speaking clearly, doesn't sound dyspneic on the phone, talks in complete sentences.    Assessment: Encounter Diagnosis  Name Primary?  . Moderate persistent extrinsic asthma with acute exacerbation Yes    Plan: Discussed symptoms and concerns, treatment recommendations below.  Patient Instructions  STOP Breo daily inhaler  BEGIN Pulmicort nebulized liquid twice daily, 12 hours apart BEGIN Albuterol nebulized liquid twice daily, 12 hours apart  You CAN use Albuterol hand held inhaler or nebulized therapy every 6 hours as need for cough, wheezing, and shortness of breath.  Continue Singulair nightly  Wear a mask if mowing or dusting or cleaning  We will refer you to allergy/asthma specialist.  Let me know in a few weeks how this is working for you.  Hope this helps.   They were advised to follow up 2 weeks, pending asthma specialist referral  Diagnoses and all orders for this visit:  Moderate persistent extrinsic asthma with acute exacerbation -     Ambulatory referral to Allergy  Other orders -     budesonide (PULMICORT) 0.5  MG/2ML nebulizer solution; Take 2 mLs (0.5 mg total) by nebulization 2 (two) times daily. -     albuterol (PROVENTIL) (2.5 MG/3ML) 0.083% nebulizer solution; Take 3 mLs (2.5 mg total) by nebulization 2 (two) times daily.   Time involving medical discussion was 20 minutes.

## 2018-06-15 NOTE — Telephone Encounter (Signed)
Please set her up in my schedule TODAY for phone virtual visit

## 2018-06-15 NOTE — Telephone Encounter (Signed)
Patient states that she has chest heaviness, feels restricted breathing, no fever, no cough, no body aches.  She has a little runny nose but more allergies.   She is having to use rescue inhaler 3 times a day along with singular and Brea.  She did say if you need to do virtual visit that was fine with her.

## 2018-06-15 NOTE — Telephone Encounter (Signed)
Call and get a little more info on her symptoms and if it seems she needs a virtual visit, set up and i'll call her

## 2018-06-15 NOTE — Patient Instructions (Signed)
STOP Breo daily inhaler  BEGIN Pulmicort nebulized liquid twice daily, 12 hours apart BEGIN Albuterol nebulized liquid twice daily, 12 hours apart  You CAN use Albuterol hand held inhaler or nebulized therapy every 6 hours as need for cough, wheezing, and shortness of breath.  Continue Singulair nightly  Wear a mask if mowing or dusting or cleaning  We will refer you to allergy/asthma specialist.  Let me know in a few weeks how this is working for you.  Hope this helps.

## 2018-06-15 NOTE — Telephone Encounter (Signed)
Pt called and forgot to mention when she spoke to Arnold earlier that she needed a refill on her Breo 200MG  [facility] [address]

## 2018-06-17 NOTE — Progress Notes (Signed)
Patient already has an appointment with [facility].

## 2018-06-24 ENCOUNTER — Telehealth: Payer: Self-pay | Admitting: Family Medicine

## 2018-06-24 NOTE — Telephone Encounter (Signed)
I called and spoke to her.  She is going to try using the budesonide in the morning only every other day for the next several days and supplementing with the Flovent hand-held HFA twice daily on the days she does not use the budesonide, but at nighttime only on the days she does use the but desonide  She will add on Zyrtec for the next week  She will call back next week if this is not working.  Next option would be trial of sample of Advair or Symbicort or Dulera if we have them.  We discussed general measures to avoid allergen triggers  I will await her call back

## 2018-06-24 NOTE — Telephone Encounter (Signed)
Pt called the Budesonide is keeping her up all night.  Can you call her in something different.  She did want you to know she is using her Albuterol q 4 hrs and she has used the Flovent this morning.  Is is ok for her to take whatever new you call her out since she is used the Flovent this am?  [facility]

## 2018-07-03 ENCOUNTER — Other Ambulatory Visit: Payer: Self-pay | Admitting: Internal Medicine

## 2018-07-05 ENCOUNTER — Other Ambulatory Visit: Payer: Self-pay | Admitting: Medical

## 2018-07-05 ENCOUNTER — Other Ambulatory Visit: Payer: Self-pay | Admitting: Internal Medicine

## 2018-07-05 ENCOUNTER — Telehealth: Payer: Self-pay | Admitting: Family Medicine

## 2018-07-05 MED ORDER — ALBUTEROL SULFATE (2.5 MG/3ML) 0.083% IN NEBU: 2.5000 mg | INHALATION_SOLUTION | RESPIRATORY_TRACT | 2 refills | Status: DC | PRN

## 2018-07-05 MED ORDER — PEAK FLOW METER DEVI: 1.0000 | Freq: Every day | 1 refills | Status: DC

## 2018-07-05 MED ORDER — FLUTICASONE FUROATE-VILANTEROL 200-25 MCG/INH IN AEPB: 1.0000 | INHALATION_SPRAY | Freq: Two times a day (BID) | RESPIRATORY_TRACT | 2 refills | Status: DC

## 2018-07-05 NOTE — Telephone Encounter (Signed)
I called and spoke to her.  She is actually not out of albuterol.  I called the pharmacy to verify.  She will go back to [facility] inhaler instead of Flovent.  She had stopped Pulmicort last week due to problems with insomnia that seem to be worsened Pulmicort neb.  She had restarted old Flovent she had left there but still having quite a few symptoms and having to use albuterol frequently.  The more she thinks about it, she did not have near as much problems using the [facility] daily  She has appointment with asthma and allergy at the end of the month

## 2018-07-05 NOTE — Telephone Encounter (Signed)
[name] called and she is not better,she is out of the Albuterol for her nebulizer machine.  She is still using 3 or 4 times a day and sometime using rescue inhaler 1 or 2 times per day.  Please call  [facility] [address]  Pt ph [phone]

## 2018-07-08 ENCOUNTER — Telehealth: Payer: Self-pay | Admitting: Internal Medicine

## 2018-07-08 MED ORDER — PANTOPRAZOLE SODIUM 40 MG PO TBEC: 40.0000 mg | DELAYED_RELEASE_TABLET | Freq: Every day | ORAL | 0 refills | Status: DC

## 2018-07-08 NOTE — Telephone Encounter (Signed)
Pt requested refill of pantoprazole. She is scheduled for Zoom visit 07/22/18.  Pt stated that she only has one tablet remaining.

## 2018-07-08 NOTE — Telephone Encounter (Signed)
Rx sent 

## 2018-07-21 ENCOUNTER — Encounter: Payer: Self-pay | Admitting: *Deleted

## 2018-07-21 ENCOUNTER — Ambulatory Visit: Payer: 59 | Admitting: Allergy

## 2018-07-22 ENCOUNTER — Ambulatory Visit (INDEPENDENT_AMBULATORY_CARE_PROVIDER_SITE_OTHER): Payer: 59 | Admitting: Internal Medicine

## 2018-07-22 ENCOUNTER — Encounter: Payer: Self-pay | Admitting: Internal Medicine

## 2018-07-22 ENCOUNTER — Other Ambulatory Visit: Payer: Self-pay

## 2018-07-22 VITALS — Ht 62.0 in | Wt 100.0 lb

## 2018-07-22 DIAGNOSIS — K200 Eosinophilic esophagitis: Secondary | ICD-10-CM

## 2018-07-22 DIAGNOSIS — Z1211 Encounter for screening for malignant neoplasm of colon: Secondary | ICD-10-CM | POA: Diagnosis not present

## 2018-07-22 NOTE — Progress Notes (Signed)
   Subjective:    Patient ID: [name], female    DOB: 31-Jul-1960, 58 y.o.   MRN: [id number]  This service was provided via telemedicine.  Zoom platform with A/V communication The patient was located at home The provider was located in provider's GI office. The patient did consent to this telephone visit and is aware of possible charges through their insurance for this visit.   The persons participating in this telemedicine service were the patient and I. Time spent on call: 18 min   HPI [name] is a 58 year old female with a history of GERD, EOE, IBS and SIBO who is seen for follow-up.  She is seen today by virtual visit in the setting of COVID-19.  She was last seen in the office on 06/16/2017.  She reports that for the whole she has been doing fairly well.  She has needed intermittent courses of budesonide slurry for EOE.  Her symptoms are predominantly chest pain which is atypical in nature and difficulty swallowing.  She does respond to this therapy.  Normally we dose this twice a day but when she takes a second daily dose of budesonide she has significant insomnia so she has been taking this once a day with still success.  She has remained on pantoprazole 40 mg daily.  Currently her chest symptoms that she attributes to EOE are better.  She has noticed that her bowel movements have been recently more "weird".  For her this is looser stool than normal.  Still only once per day.  No blood in her stool or melena.  Not much recent abdominal bloating.  She tried FD guard without much benefit.  She wonders if her looser stool is related to anxiety.   Review of Systems As per HPI, otherwise negative  Current Medications, Allergies, Past Medical History, Past Surgical History, Family History and Social History were reviewed in [employer] record.     Objective:   Physical Exam No physical exam, virtual visit      Assessment & Plan:   58 year old female  with a history of GERD, EOE, IBS and SIBO who is seen for follow-up.  1. GERD/EoE --under control currently.  She has needed intermittent topical steroid therapy with budesonide even with daily PPI, fortunately she responds to this therapy. --Continue pantoprazole 40 mg daily --Okay for intermittent budesonide slurry once daily x6 to 8 weeks  2.  Colon cancer screening --due for colonoscopy this year for screening.  Her last colonoscopy was 10 years ago.  We discussed the risk, benefits and alternatives and she is agreeable and wishes to proceed.  Would like to proceed with colonoscopy in June as her insurance will reset on July 1. --Colonoscopy for screening June 2020

## 2018-08-11 ENCOUNTER — Ambulatory Visit: Payer: 59 | Admitting: Allergy

## 2018-08-23 HISTORY — PX: COLONOSCOPY: SHX174

## 2018-08-31 ENCOUNTER — Other Ambulatory Visit: Payer: Self-pay

## 2018-08-31 ENCOUNTER — Ambulatory Visit (AMBULATORY_SURGERY_CENTER): Payer: Self-pay

## 2018-08-31 VITALS — Ht 62.0 in | Wt 101.0 lb

## 2018-08-31 DIAGNOSIS — Z1211 Encounter for screening for malignant neoplasm of colon: Secondary | ICD-10-CM

## 2018-08-31 MED ORDER — NA SULFATE-K SULFATE-MG SULF 17.5-3.13-1.6 GM/177ML PO SOLN: 1.0000 | Freq: Once | ORAL | 0 refills | Status: AC

## 2018-08-31 NOTE — Progress Notes (Signed)
Denies allergies to eggs or soy products. Denies complication of anesthesia or sedation. Denies use of weight loss medication. Denies use of O2.   Emmi instructions given for colonoscopy.  Pre-Visit was conducted by phone due to Covid 19. Instructions were reviewed and mailed to patients confirmed home address. Patient was encouraged to call if she had any questions or concerns regarding instructions.  

## 2018-09-01 LAB — HM DEXA SCAN

## 2018-09-03 ENCOUNTER — Encounter: Payer: Self-pay | Admitting: Internal Medicine

## 2018-09-08 ENCOUNTER — Ambulatory Visit: Payer: 59 | Admitting: Allergy

## 2018-09-08 ENCOUNTER — Encounter: Payer: Self-pay | Admitting: Allergy

## 2018-09-08 ENCOUNTER — Other Ambulatory Visit: Payer: Self-pay

## 2018-09-08 VITALS — BP 118/60 | HR 74 | Temp 97.2°F | Resp 16 | Ht 61.0 in | Wt 102.2 lb

## 2018-09-08 DIAGNOSIS — H1013 Acute atopic conjunctivitis, bilateral: Secondary | ICD-10-CM

## 2018-09-08 DIAGNOSIS — J3089 Other allergic rhinitis: Secondary | ICD-10-CM | POA: Diagnosis not present

## 2018-09-08 DIAGNOSIS — J4540 Moderate persistent asthma, uncomplicated: Secondary | ICD-10-CM

## 2018-09-08 MED ORDER — FLUTICASONE PROPIONATE 50 MCG/ACT NA SUSP: 2.0000 | Freq: Every day | NASAL | 5 refills | Status: AC

## 2018-09-08 MED ORDER — ALBUTEROL SULFATE HFA 108 (90 BASE) MCG/ACT IN AERS: 2.0000 | INHALATION_SPRAY | Freq: Four times a day (QID) | RESPIRATORY_TRACT | 1 refills | Status: DC | PRN

## 2018-09-08 MED ORDER — MONTELUKAST SODIUM 10 MG PO TABS: 10.0000 mg | ORAL_TABLET | Freq: Every day | ORAL | 3 refills | Status: DC

## 2018-09-08 NOTE — Progress Notes (Signed)
New Patient Note  RE: [name] MRN: [id number] DOB: [birth year] Date of Office Visit: 09/08/2018  Referring provider: Carlena Hurl, PA-C Primary care provider: Carlena Hurl, PA-C  Chief Complaint: asthma  History of present illness: [name] is a 59 y.o. female presenting today for consultation for asthma.      About 3 months ago she started having difficulty breathing with SOB and chest tightness and needing to use her albuterol inhaler few times a day for relief of symptoms.  She was having nighttime awakenings at the time too.  She saw her PCP and states that they tried several different combinations of medications before she found the right combination to control her symptoms.   She is currently on Breo and singulair.  Prior to this she reports flovent was not helping.  She also tried budesonide nebs but this made her night.  She does report having childhood asthma diagnosed about $2.  She states for years she worked out with a Clinical research associate.  However she sates she would get winded very quickly with exercise.      She also reports taking zyrtec in the mornings.  She reports May and November traditionally were her worse months for allergy and asthma symptoms and she would take flovent during those months prior to this year.  She does report allergy symptoms of itchy eyes, sneezing, stuffy nose.  Zyrtec controls these symptoms.  She will take zyrtec seasonally in the spring.     She does have EOE and follows with GI and will use budesonide slurry when she has flares.  She states she has not any symptoms in a long time and has been controlled.   No history of eczema or food allergy.  She reports that she does avoid any foods from an EOE.    Review of systems: Review of Systems  Constitutional: Negative for chills, fever and malaise/fatigue.  HENT: Positive for congestion. Negative for ear discharge, nosebleeds and sore throat.   Eyes: Negative for pain, discharge and  redness.  Respiratory: Positive for cough and shortness of breath.   Cardiovascular: Negative for chest pain.  Gastrointestinal: Negative for abdominal pain, constipation, diarrhea, heartburn, nausea and vomiting.  Musculoskeletal: Negative for joint pain.  Skin: Negative for itching and rash.  Neurological: Negative for headaches.    All other systems negative unless noted above in HPI  Past medical history: Past Medical History:  Diagnosis Date  . Allergy   . Anxiety   . Asthma   . Chronic back pain   . Depression   . Eosinophilic esophagitis   . Esophageal spasm   . Esophageal stricture   . Gastritis   . GERD (gastroesophageal reflux disease)   . Headache    occasional  . IBS (irritable bowel syndrome)   . Kidney stone 2015   gets every few years, hx/o calcium oxalate stones  . Osteopenia   . Osteopenia   . Scoliosis   . Seasonal allergic conjunctivitis   . Seasonal allergic rhinitis   . Small intestinal bacterial overgrowth   . Vitamin D deficiency   . Wears glasses     Past surgical history: Past Surgical History:  Procedure Laterality Date  . APPENDECTOMY    . BREAST EXCISIONAL BIOPSY     left  . COLONOSCOPY  2013   [place], [place]  . ESOPHAGEAL DILATION     several times prior, [place], [place]  . ESOPHAGOPLASTY  03/2876   complication of esophageal  dilitation, hospitalization at [facility] Right 12/31/2017   Procedure: EXCISION RIGHT WRIST DORSAL GANGLION;  Surgeon: Leanora Cover, MD;  Location: [facility];  Service: Orthopedics;  Laterality: Right;  Bier block  . INCISION AND DRAINAGE ABSCESS Bilateral 09/14/2015   Procedure: INCISION AND DRAINAGE ABSCESS;  Surgeon: Leanora Cover, MD;  Location: [facility];  Service: Orthopedics;  Laterality: Bilateral;  . [place]  . LITHOTRIPSY     4x prior as of 05/2014    Family history:  Family History  Problem Relation Age of Onset  .  Barrett's esophagus Mother   . Diabetes Father   . Kidney Stones Father   . Hypertension Father   . Heart attack Father   . Heart disease Father   . Diabetes Sister   . Kidney Stones Brother   . Migraines Brother   . Heart disease Maternal Grandfather   . Barrett's esophagus Paternal Grandfather   . Cancer Neg Hx   . Stroke Neg Hx   . Colon cancer Neg Hx   . Esophageal cancer Neg Hx   . Stomach cancer Neg Hx   . Rectal cancer Neg Hx     Social history: livesin a home with carpeting with gas heating and central cooling.  2 dogs in the home.  No concern for water damage, mildew or roaches in the home.  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  She denies a smoking history.   Medication List: Allergies as of 09/08/2018   No Known Allergies     Medication List       Accurate as of September 08, 2018  5:14 PM. If you have any questions, ask your nurse or doctor.        STOP taking these medications   HYDROcodone-acetaminophen 5-325 MG tablet Commonly known as: Norco Stopped by: Favour Aleshire Charmian Muff, MD     TAKE these medications   albuterol 108 (90 Base) MCG/ACT inhaler Commonly known as: VENTOLIN HFA Inhale 2 puffs into the lungs every 6 (six) hours as needed for wheezing or shortness of breath. What changed: Another medication with the same name was removed. Continue taking this medication, and follow the directions you see here. Changed by: Marra Fraga Charmian Muff, MD   Breo Ellipta [id number] MCG/INH Aepb Generic drug: fluticasone furoate-vilanterol Inhale 1 puff into the lungs daily. What changed: Another medication with the same name was removed. Continue taking this medication, and follow the directions you see here. Changed by: Yancy Knoble Charmian Muff, MD   diclofenac 75 MG EC tablet Commonly known as: VOLTAREN Take 1 tablet (75 mg total) by mouth 2 (two) times daily. What changed:   when to take this  reasons to take this   fluticasone 50  MCG/ACT nasal spray Commonly known as: Flonase Place 2 sprays into both nostrils daily. Started by: Maynard David Charmian Muff, MD   LORazepam 0.5 MG tablet Commonly known as: ATIVAN Take 1 tablet (0.5 mg total) by mouth 2 (two) times daily as needed for anxiety.   methocarbamol 500 MG tablet Commonly known as: ROBAXIN Take 1 tablet (500 mg total) by mouth 2 (two) times daily as needed (back pain).   montelukast 10 MG tablet Commonly known as: SINGULAIR Take 1 tablet (10 mg total) by mouth at bedtime.   pantoprazole 40 MG tablet Commonly known as: PROTONIX Take 1 tablet (40 mg total) by mouth daily.   Peak Flow Meter Devi 1  each by Does not apply route daily.       Known medication allergies: No Known Allergies   Physical examination: Blood pressure 118/60, pulse 74, temperature (!) 97.2 F (36.2 C), temperature source Temporal, resp. rate 16, height 5\' 1"  (1.549 m), weight 102 lb 3.2 oz (46.4 kg), last menstrual period 10/31/2016, SpO2 97 %.  General: Alert, interactive, in no acute distress. HEENT: PERRLA, TMs pearly gray, turbinates mildly edematous without discharge, post-pharynx non erythematous. Neck: Supple without lymphadenopathy. Lungs: Clear to auscultation without wheezing, rhonchi or rales. {no increased work of breathing. CV: Normal S1, S2 without murmurs. Abdomen: Nondistended, nontender. Skin: Warm and dry, without lesions or rashes. Extremities:  No clubbing, cyanosis or edema. Neuro:   Grossly intact.  Diagnositics/Labs:  Spirometry: FEV1: 2.16L 91%, FVC: 3.16L 104%, ratio consistent with nonobstructive pattern  Allergy testing: environmental allergy skin prick testing is positive to grasses, weeds, trees, molds, dust mites, cat, dog, horse epithelia, mouse, tobacco leaf Allergy testing results were read and interpreted by provider, documented by clinical staff.   Assessment and plan:   Asthma, mod persistent  - recent increase in asthma symptoms  most likely driven by allergens (specifically tree pollens)  - have access to albuterol inhaler 2 puffs every 4-6 hours as needed for cough/wheeze/shortness of breath/chest tightness.  May use 15-20 minutes prior to activity.   Monitor frequency of use.    - continue Breo 255mcg 1 puff once a day  - continue Singulair 10 mg daily at bedtime  - if asthma symptoms are not controlled with inhaler medication then will consider Xolair for allergic asthma control  Asthma control goals:   Full participation in all desired activities (may need albuterol before activity)  Albuterol use two time or less a week on average (not counting use with activity)  Cough interfering with sleep two time or less a month  Oral steroids no more than once a year  No hospitalizations  Allergic rhinitis with conjunctivitis  - environmental allergy skin testing is positive to grass pollens, weed pollens, tree pollens, molds, dust mites, cat, dog, horse, mouse and tobacco leaf  - allergen avoidance measures discussed/handouts provided  - continue Zyrtec 10mg  daily as needed  - continue Flonase 2 sprays each nostril daily for nasal congestion.  Use for 1-2 weeks at a time before stopping once symptoms improve.   - for itchy/watery/red eyes can use OTC Pataday 1 drop each eye daily as needed  - if medication management is not effective in controlling symptoms then consider another course of allergen immunotherapy (allergy shots)  Follow-up 3-4 months or sooner if needed I appreciate the opportunity to take part in Bryony's care. Please do not hesitate to contact me with questions.  Sincerely,   Prudy Feeler, MD Allergy/Immunology [facility] [place] [facility]

## 2018-09-08 NOTE — Patient Instructions (Addendum)
Asthma  - recent increase in asthma symptoms most likely driven by allergens (specifically tree pollens)  - have access to albuterol inhaler 2 puffs every 4-6 hours as needed for cough/wheeze/shortness of breath/chest tightness.  May use 15-20 minutes prior to activity.   Monitor frequency of use.    - continue Breo 238mcg 1 puff once a day  - continue Singulair 10 mg daily at bedtime  - if asthma symptoms are not controlled with inhaler medication then will consider Xolair for allergic asthma control  Asthma control goals:   Full participation in all desired activities (may need albuterol before activity)  Albuterol use two time or less a week on average (not counting use with activity)  Cough interfering with sleep two time or less a month  Oral steroids no more than once a year  No hospitalizations  Allergies   - environmental allergy skin testing is positive to grass pollens, weed pollens, tree pollens, molds, dust mites, cat, dog, horse, mouse and tobacco leaf  - allergen avoidance measures discussed/handouts provided  - continue Zyrtec 10mg  daily as needed  - continue Flonase 2 sprays each nostril daily for nasal congestion.  Use for 1-2 weeks at a time before stopping once symptoms improve.   - for itchy/watery/red eyes can use OTC Pataday 1 drop each eye daily as needed  - if medication management is not effective in controlling symptoms then consider another course of allergen immunotherapy (allergy shots)  Follow-up 3-4 months or sooner if needed

## 2018-09-13 ENCOUNTER — Telehealth: Payer: Self-pay | Admitting: Internal Medicine

## 2018-09-13 NOTE — Telephone Encounter (Signed)
lmom for pt to cb and answer covid questions

## 2018-09-14 ENCOUNTER — Other Ambulatory Visit: Payer: Self-pay

## 2018-09-14 ENCOUNTER — Ambulatory Visit (AMBULATORY_SURGERY_CENTER): Payer: 59 | Admitting: Internal Medicine

## 2018-09-14 ENCOUNTER — Encounter: Payer: Self-pay | Admitting: Internal Medicine

## 2018-09-14 ENCOUNTER — Encounter: Payer: 59 | Admitting: Internal Medicine

## 2018-09-14 VITALS — BP 122/75 | HR 73 | Temp 98.5°F | Resp 15 | Ht 61.0 in | Wt 102.0 lb

## 2018-09-14 DIAGNOSIS — Z1211 Encounter for screening for malignant neoplasm of colon: Secondary | ICD-10-CM

## 2018-09-14 DIAGNOSIS — D123 Benign neoplasm of transverse colon: Secondary | ICD-10-CM

## 2018-09-14 DIAGNOSIS — D122 Benign neoplasm of ascending colon: Secondary | ICD-10-CM

## 2018-09-14 MED ORDER — SODIUM CHLORIDE 0.9 % IV SOLN: 500.0000 mL | Freq: Once | INTRAVENOUS | Status: DC

## 2018-09-14 NOTE — Op Note (Signed)
[facility] Patient Name: Rees Matura Procedure Date: 09/14/2018 12:55 PM MRN: [id number] Endoscopist: Jerene Bears , MD Age: 58 Referring MD:  Date of Birth: 1960/09/18 Gender: Female Account #: [id number] Procedure:                Colonoscopy Indications:              Screening for colorectal malignant neoplasm, Last                            colonoscopy 10 years ago Medicines:                Monitored Anesthesia Care Procedure:                Pre-Anesthesia Assessment:                           - Prior to the procedure, a History and Physical                            was performed, and patient medications and                            allergies were reviewed. The patient's tolerance of                            previous anesthesia was also reviewed. The risks                            and benefits of the procedure and the sedation                            options and risks were discussed with the patient.                            All questions were answered, and informed consent                            was obtained. Prior Anticoagulants: The patient has                            taken no previous anticoagulant or antiplatelet                            agents. ASA Grade Assessment: II - A patient with                            mild systemic disease. After reviewing the risks                            and benefits, the patient was deemed in                            satisfactory condition to undergo the procedure.  After obtaining informed consent, the colonoscope                            was passed under direct vision. Throughout the                            procedure, the patient's blood pressure, pulse, and                            oxygen saturations were monitored continuously. The                            Colonoscope was introduced through the anus and                            advanced to the cecum, identified by  appendiceal                            orifice and ileocecal valve. The colonoscopy was                            performed without difficulty. The patient tolerated                            the procedure well. The quality of the bowel                            preparation was good. The ileocecal valve,                            appendiceal orifice, and rectum were photographed. Scope In: 1:11:41 PM Scope Out: 1:28:45 PM Scope Withdrawal Time: 0 hours 13 minutes 51 seconds  Total Procedure Duration: 0 hours 17 minutes 4 seconds  Findings:                 The digital rectal exam was normal.                           A 6 mm polyp was found in the ascending colon. The                            polyp was sessile. The polyp was removed with a                            cold snare. Resection and retrieval were complete.                           A 2 mm polyp was found in the hepatic flexure. The                            polyp was sessile. The polyp was removed with a  cold snare. Resection and retrieval were complete.                           Scattered small-mouthed diverticula were found in                            the sigmoid colon and descending colon.                           Anal papilla(e) were hypertrophied on retroflexed                            views in the rectum. Retroflexion otherwise normal. Complications:            No immediate complications. Estimated Blood Loss:     Estimated blood loss was minimal. Impression:               - One 6 mm polyp in the ascending colon, removed                            with a cold snare. Resected and retrieved.                           - One 2 mm polyp at the hepatic flexure, removed                            with a cold snare. Resected and retrieved.                           - Diverticulosis in the sigmoid colon and in the                            descending colon. Recommendation:           - Patient  has a contact number available for                            emergencies. The signs and symptoms of potential                            delayed complications were discussed with the                            patient. Return to normal activities tomorrow.                            Written discharge instructions were provided to the                            patient.                           - Resume previous diet.                           - Continue present medications.                           -  Await pathology results.                           - Repeat colonoscopy is recommended for                            surveillance. The colonoscopy date will be                            determined after pathology results from today's                            exam become available for review. Jerene Bears, MD 09/14/2018 1:35:12 PM This report has been signed electronically.

## 2018-09-14 NOTE — Progress Notes (Signed)
Pt asked if okay to try OTC Vinegar gummies for Digestion.  Per Dr. Hilarie Fredrickson okay to try if making GERD sx worse to discontinue.  Pt was notified. maw

## 2018-09-14 NOTE — Progress Notes (Signed)
Pt. Reports no change in her medical or surgical history since her pre-visit 09/08/2018.  Temp checked by Weinert. VS by JB.

## 2018-09-14 NOTE — Patient Instructions (Signed)
YOU HAD AN ENDOSCOPIC PROCEDURE TODAY AT THE [facility]:   Refer to the procedure report that was given to you for any specific questions about what was found during the examination.  If the procedure report does not answer your questions, please call your gastroenterologist to clarify.  If you requested that your care partner not be given the details of your procedure findings, then the procedure report has been included in a sealed envelope for you to review at your convenience later.  YOU SHOULD EXPECT: Some feelings of bloating in the abdomen. Passage of more gas than usual.  Walking can help get rid of the air that was put into your GI tract during the procedure and reduce the bloating. If you had a lower endoscopy (such as a colonoscopy or flexible sigmoidoscopy) you may notice spotting of blood in your stool or on the toilet paper. If you underwent a bowel prep for your procedure, you may not have a normal bowel movement for a few days.  Please Note:  You might notice some irritation and congestion in your nose or some drainage.  This is from the oxygen used during your procedure.  There is no need for concern and it should clear up in a day or so.  SYMPTOMS TO REPORT IMMEDIATELY:   Following lower endoscopy (colonoscopy or flexible sigmoidoscopy):  Excessive amounts of blood in the stool  Significant tenderness or worsening of abdominal pains  Swelling of the abdomen that is new, acute  Fever of 100F or higher   For urgent or emergent issues, a gastroenterologist can be reached at any hour by calling [phone].   DIET:  We do recommend a small meal at first, but then you may proceed to your regular diet.  Drink plenty of fluids but you should avoid alcoholic beverages for 24 hours.  ACTIVITY:  You should plan to take it easy for the rest of today and you should NOT DRIVE or use heavy machinery until tomorrow (because of the sedation medicines used during the test).     FOLLOW UP: Our staff will call the number listed on your records 48-72 hours following your procedure to check on you and address any questions or concerns that you may have regarding the information given to you following your procedure. If we do not reach you, we will leave a message.  We will attempt to reach you two times.  During this call, we will ask if you have developed any symptoms of COVID 19. If you develop any symptoms (ie: fever, flu-like symptoms, shortness of breath, cough etc.) before then, please call [phone].  If you test positive for Covid 19 in the 2 weeks post procedure, please call and report this information to us.    If any biopsies were taken you will be contacted by phone or by letter within the next 1-3 weeks.  Please call us at [phone] if you have not heard about the biopsies in 3 weeks.    SIGNATURES/CONFIDENTIALITY: You and/or your care partner have signed paperwork which will be entered into your electronic medical record.  These signatures attest to the fact that that the information above on your After Visit Summary has been reviewed and is understood.  Full responsibility of the confidentiality of this discharge information lies with you and/or your care-partner.    Handouts were given to you on polyps and diverticulosis. You may resume your current medications today. Await biopsy results. Please call if any questions   or concerns.   

## 2018-09-14 NOTE — Progress Notes (Signed)
No problems noted in the recovery room. maw 

## 2018-09-14 NOTE — Progress Notes (Signed)
Report given to PACU, vss 

## 2018-09-14 NOTE — Progress Notes (Signed)
Called to room to assist during endoscopic procedure.  Patient ID and intended procedure confirmed with present staff. Received instructions for my participation in the procedure from the performing physician.  

## 2018-09-16 ENCOUNTER — Telehealth: Payer: Self-pay

## 2018-09-16 NOTE — Telephone Encounter (Signed)
  Follow up Call-  Call back number 09/14/2018 11/27/2016  Post procedure Call Back phone  # [phone]  Permission to leave phone message Yes Yes  Some recent data might be hidden    1. Have you developed a fever since your procedure? no  2.   Have you had an respiratory symptoms (SOB or cough) since your procedure? no  3.   Have you tested positive for COVID 19 since your procedure no  4.   Have you had any family members/close contacts diagnosed with the COVID 19 since your procedure?  no   If yes to any of these questions please route to Joylene John, RN and Alphonsa Gin, Therapist, sports.   Patient questions:  Do you have a fever, pain , or abdominal swelling? No. Pain Score  0 *  Have you tolerated food without any problems? Yes.    Have you been able to return to your normal activities? Yes.    Do you have any questions about your discharge instructions: Diet   No. Medications  No. Follow up visit  No.  Do you have questions or concerns about your Care? No.  Actions: * If pain score is 4 or above: No action needed, pain <4.

## 2018-09-20 ENCOUNTER — Encounter: Payer: Self-pay | Admitting: Internal Medicine

## 2018-09-26 ENCOUNTER — Other Ambulatory Visit: Payer: Self-pay | Admitting: Medical

## 2018-10-04 ENCOUNTER — Other Ambulatory Visit: Payer: Self-pay | Admitting: Internal Medicine

## 2018-12-29 ENCOUNTER — Ambulatory Visit: Payer: 59 | Admitting: Allergy

## 2018-12-30 ENCOUNTER — Ambulatory Visit: Payer: 59 | Admitting: Allergy

## 2018-12-30 ENCOUNTER — Other Ambulatory Visit: Payer: Self-pay

## 2018-12-30 ENCOUNTER — Encounter: Payer: Self-pay | Admitting: Allergy

## 2018-12-30 VITALS — BP 108/82 | HR 65 | Temp 97.7°F | Resp 16 | Ht 61.0 in | Wt 107.0 lb

## 2018-12-30 DIAGNOSIS — J4541 Moderate persistent asthma with (acute) exacerbation: Secondary | ICD-10-CM

## 2018-12-30 DIAGNOSIS — J3089 Other allergic rhinitis: Secondary | ICD-10-CM

## 2018-12-30 DIAGNOSIS — H1013 Acute atopic conjunctivitis, bilateral: Secondary | ICD-10-CM | POA: Diagnosis not present

## 2018-12-30 MED ORDER — MONTELUKAST SODIUM 10 MG PO TABS: 10.0000 mg | ORAL_TABLET | Freq: Every day | ORAL | 1 refills | Status: DC

## 2018-12-30 NOTE — Progress Notes (Signed)
Follow-up Note  RE: [name] MRN: [id number] DOB: 04-Nov-1960 Date of Office Visit: 12/30/2018   History of present illness: [name] is a 58 y.o. female presenting today for follow-up of asthma and allergies.  She was last seen in the office on 09/08/2018 by myself.    She states she went hiking on Saturday.  This week she reports having nighttime symptoms with chest tightness, SOB and unable to get a deep breath in.   The last 2 nights she has required albuterol neb treatments.  She states prior to this week she had been doing well with infrequent use of her albuterol.  She is taking Breo 238mcg 1 puff daily and singulair daily.   She states she has not had any significant nasal, ocular or generalized allergy symptoms.  She is taking zyrtec daily.  She has both Pataday and Flonase for as needed use.     Review of systems: Review of Systems  Constitutional: Negative for chills, fever and malaise/fatigue.  HENT: Negative for congestion, ear discharge, nosebleeds and sore throat.   Eyes: Negative for pain, discharge and redness.  Respiratory: Positive for shortness of breath. Negative for cough, sputum production and wheezing.   Cardiovascular: Negative for chest pain.  Gastrointestinal: Negative for abdominal pain, constipation, diarrhea, heartburn, nausea and vomiting.  Musculoskeletal: Negative for joint pain.  Skin: Negative for itching and rash.  Neurological: Negative for headaches.   All other systems negative unless noted above in HPI  Past medical/social/surgical/family history have been reviewed and are unchanged unless specifically indicated below.  No changes  Medication List: Current Outpatient Medications  Medication Sig Dispense Refill  . albuterol (VENTOLIN HFA) 108 (90 Base) MCG/ACT inhaler Inhale 2 puffs into the lungs every 6 (six) hours as needed for wheezing or shortness of breath. 1 Inhaler 1  . diclofenac (VOLTAREN) 75 MG EC tablet Take 1 tablet  (75 mg total) by mouth 2 (two) times daily. 45 tablet 2  . estradiol (VIVELLE-DOT) 0.025 MG/24HR APPLY 1 PATCH TWICE A WEEK    . fluticasone (FLONASE) 50 MCG/ACT nasal spray Place 2 sprays into both nostrils daily. 16 g 5  . fluticasone furoate-vilanterol (BREO ELLIPTA) 200-25 MCG/INH AEPB Inhale 1 puff into the lungs daily.    Marland Kitchen LORazepam (ATIVAN) 0.5 MG tablet Take 1 tablet (0.5 mg total) by mouth 2 (two) times daily as needed for anxiety. 60 tablet 1  . methocarbamol (ROBAXIN) 500 MG tablet Take 1 tablet (500 mg total) by mouth 2 (two) times daily as needed (back pain). 45 tablet 2  . montelukast (SINGULAIR) 10 MG tablet Take 1 tablet (10 mg total) by mouth at bedtime. 30 tablet 3  . pantoprazole (PROTONIX) 40 MG tablet TAKE 1 TABLET BY MOUTH EVERY DAY 90 tablet 1  . Peak Flow Meter DEVI 1 each by Does not apply route daily. 1 each 1  . progesterone (PROMETRIUM) 200 MG capsule TAKE 1 CAPSULE BY MOUTH EVERY DAY FOR 12 DAYS     No current facility-administered medications for this visit.      Known medication allergies: No Known Allergies   Physical examination: Blood pressure 108/82, pulse 65, temperature 97.7 F (36.5 C), temperature source Temporal, resp. rate 16, height 5\' 1"  (1.549 m), weight 107 lb (48.5 kg), last menstrual period 10/31/2016, SpO2 99 %.  General: Alert, interactive, in no acute distress. HEENT: PERRLA, TMs pearly gray, turbinates non-edematous without discharge, post-pharynx non erythematous. Neck: Supple without lymphadenopathy. Lungs: Clear to auscultation without wheezing,  rhonchi or rales. {no increased work of breathing. CV: Normal S1, S2 without murmurs. Abdomen: Nondistended, nontender. Skin: Warm and dry, without lesions or rashes. Extremities:  No clubbing, cyanosis or edema. Neuro:   Grossly intact.  Diagnositics/Labs:  Spirometry: FEV1: 2.23L 94%, FVC: 3.19L 105%, ratio consistent with nonobstructive pattern  Assessment and plan:   Asthma, mod  persistent with exacerbation  - recent increase in nighttime asthma symptoms most likely driven by allergens (maybe driven by weed pollen exposure from hiking)  - have access to albuterol inhaler 2 puffs every 4-6 hours as needed for cough/wheeze/shortness of breath/chest tightness.  May use 15-20 minutes prior to activity.   Monitor frequency of use.    - continue Breo 257mcg 1 puff once a day  - continue Singulair 10 mg daily at bedtime  - for current flare take prednisone pack for next 5 days  - if asthma symptoms are not controlled with inhaler medication then will consider Xolair for allergic asthma control  Asthma control goals:   Full participation in all desired activities (may need albuterol before activity)  Albuterol use two time or less a week on average (not counting use with activity)  Cough interfering with sleep two time or less a month  Oral steroids no more than once a year  No hospitalizations  Allergic rhinitis with conjunctivitis  - continue avoidance measures for grass pollens, weed pollens, tree pollens, molds, dust mites, cat, dog, horse, mouse and tobacco leaf  - continue Zyrtec 10mg  daily as needed  - continue Flonase 2 sprays each nostril daily for nasal congestion.  Use for 1-2 weeks at a time before stopping once symptoms improve.   - for itchy/watery/red eyes can use OTC Pataday 1 drop each eye daily as needed  - if medication management is not effective in controlling symptoms then consider another course of allergen immunotherapy (allergy shots)  Follow-up 3-4 months or sooner if needed  I appreciate the opportunity to take part in [name]'s care. Please do not hesitate to contact me with questions.  Sincerely,   Prudy Feeler, MD Allergy/Immunology [facility]

## 2018-12-30 NOTE — Patient Instructions (Addendum)
Asthma  - recent increase in nighttime asthma symptoms most likely driven by allergens (maybe driven by weed pollen exposure from hiking)  - have access to albuterol inhaler 2 puffs every 4-6 hours as needed for cough/wheeze/shortness of breath/chest tightness.  May use 15-20 minutes prior to activity.   Monitor frequency of use.    - continue Breo 26mcg 1 puff once a day  - continue Singulair 10 mg daily at bedtime  - for current flare take prednisone pack for next 5 days  - if asthma symptoms are not controlled with inhaler medication then will consider Xolair for allergic asthma control  Asthma control goals:   Full participation in all desired activities (may need albuterol before activity)  Albuterol use two time or less a week on average (not counting use with activity)  Cough interfering with sleep two time or less a month  Oral steroids no more than once a year  No hospitalizations  Allergies   - continue avoidance measures for grass pollens, weed pollens, tree pollens, molds, dust mites, cat, dog, horse, mouse and tobacco leaf  - continue Zyrtec 10mg  daily as needed  - continue Flonase 2 sprays each nostril daily for nasal congestion.  Use for 1-2 weeks at a time before stopping once symptoms improve.   - for itchy/watery/red eyes can use OTC Pataday 1 drop each eye daily as needed  - if medication management is not effective in controlling symptoms then consider another course of allergen immunotherapy (allergy shots)  Follow-up 3-4 months or sooner if needed

## 2019-01-04 ENCOUNTER — Ambulatory Visit: Payer: 59 | Admitting: Medical

## 2019-01-04 ENCOUNTER — Other Ambulatory Visit: Payer: Self-pay

## 2019-01-04 ENCOUNTER — Encounter: Payer: Self-pay | Admitting: Medical

## 2019-01-04 VITALS — BP 110/70 | HR 61 | Temp 98.2°F | Ht 62.0 in | Wt 108.2 lb

## 2019-01-04 DIAGNOSIS — R002 Palpitations: Secondary | ICD-10-CM | POA: Diagnosis not present

## 2019-01-04 DIAGNOSIS — G479 Sleep disorder, unspecified: Secondary | ICD-10-CM | POA: Insufficient documentation

## 2019-01-04 DIAGNOSIS — R5383 Other fatigue: Secondary | ICD-10-CM | POA: Insufficient documentation

## 2019-01-04 DIAGNOSIS — F419 Anxiety disorder, unspecified: Secondary | ICD-10-CM

## 2019-01-04 DIAGNOSIS — J4540 Moderate persistent asthma, uncomplicated: Secondary | ICD-10-CM

## 2019-01-04 DIAGNOSIS — R41840 Attention and concentration deficit: Secondary | ICD-10-CM | POA: Insufficient documentation

## 2019-01-04 NOTE — Progress Notes (Signed)
Subjective: Chief Complaint  Patient presents with  . Fatigue    feeling and no energy x1 month    Here for c/o begin tired all the time, can't think, can't sleep well, worse in last month.   Lately feeling chest pounding.  Sometimes gets these spells in the middle of the day, feels like a zombi.  Can't focus on things.  If she lies down, will sleep 2 hours.  She notes she has extreme fatigue.  She denies any recent cold or flu symptoms.  Does not know of any Covid exposures.  Has had some sniffles in recent month but no known Covid infection.  No Covid contacts.    Hasn't been using ativan rregularlybut took one last night, hardly slept at all.  Back in summer had bone scan showing osteoporosis, and gynecology put her on several supplements.   She didn't tolerated some of this due to diarrhea, and medication adverse effects.    This was around the time she started getting spells of tiredness and fatigue.  No vaginal bleeding, LMP over year ago.  No hx/o anemia, no history of thyroid issues.  Usually sleeps 7 hours per night.     Lately sleeping not well the last 2 weeks or less, but prior to this was doing better.  Had this problem prior entering menopause.    Has used Paxil in the past.  May consider this again.     Past Medical History:  Diagnosis Date  . Allergy   . Anxiety   . Asthma   . Chronic back pain   . Depression   . Eosinophilic esophagitis   . Esophageal spasm   . Esophageal stricture   . Gastritis   . GERD (gastroesophageal reflux disease)   . Headache    occasional  . IBS (irritable bowel syndrome)   . Kidney stone 2015   gets every few years, hx/o calcium oxalate stones  . Osteopenia   . Osteopenia   . Scoliosis   . Seasonal allergic conjunctivitis   . Seasonal allergic rhinitis   . Small intestinal bacterial overgrowth   . Vitamin D deficiency   . Wears glasses    Current Outpatient Medications on File Prior to Visit  Medication Sig Dispense  Refill  . albuterol (VENTOLIN HFA) 108 (90 Base) MCG/ACT inhaler Inhale 2 puffs into the lungs every 6 (six) hours as needed for wheezing or shortness of breath. 1 Inhaler 1  . estradiol (VIVELLE-DOT) 0.025 MG/24HR APPLY 1 PATCH TWICE A WEEK    . fluticasone (FLONASE) 50 MCG/ACT nasal spray Place 2 sprays into both nostrils daily. 16 g 5  . fluticasone furoate-vilanterol (BREO ELLIPTA) 200-25 MCG/INH AEPB Inhale 1 puff into the lungs daily.    Marland Kitchen LORazepam (ATIVAN) 0.5 MG tablet Take 1 tablet (0.5 mg total) by mouth 2 (two) times daily as needed for anxiety. 60 tablet 1  . montelukast (SINGULAIR) 10 MG tablet Take 1 tablet (10 mg total) by mouth at bedtime. 90 tablet 1  . pantoprazole (PROTONIX) 40 MG tablet TAKE 1 TABLET BY MOUTH EVERY DAY 90 tablet 1  . Peak Flow Meter DEVI 1 each by Does not apply route daily. 1 each 1  . progesterone (PROMETRIUM) 200 MG capsule TAKE 1 CAPSULE BY MOUTH EVERY DAY FOR 12 DAYS    . diclofenac (VOLTAREN) 75 MG EC tablet Take 1 tablet (75 mg total) by mouth 2 (two) times daily. (Patient not taking: Reported on 01/04/2019) 45 tablet 2  .  methocarbamol (ROBAXIN) 500 MG tablet Take 1 tablet (500 mg total) by mouth 2 (two) times daily as needed (back pain). (Patient not taking: Reported on 01/04/2019) 45 tablet 2   No current facility-administered medications on file prior to visit.    Family History  Problem Relation Age of Onset  . Barrett's esophagus Mother   . Diabetes Father   . Kidney Stones Father   . Hypertension Father   . Heart attack Father   . Heart disease Father   . Diabetes Sister   . Kidney Stones Brother   . Migraines Brother   . Heart disease Maternal Grandfather   . Barrett's esophagus Paternal Grandfather   . Cancer Neg Hx   . Stroke Neg Hx   . Colon cancer Neg Hx   . Esophageal cancer Neg Hx   . Stomach cancer Neg Hx   . Rectal cancer Neg Hx    Past Surgical History:  Procedure Laterality Date  . APPENDECTOMY    . BREAST EXCISIONAL  BIOPSY     left  . COLONOSCOPY  2013   [place], [place]  . ESOPHAGEAL DILATION     several times prior, [place], [place]  . ESOPHAGOPLASTY  0000000   complication of esophageal dilitation, hospitalization at [facility] Right 12/31/2017   Procedure: EXCISION RIGHT WRIST DORSAL GANGLION;  Surgeon: Leanora Cover, MD;  Location: [facility];  Service: Orthopedics;  Laterality: Right;  Bier block  . INCISION AND DRAINAGE ABSCESS Bilateral 09/14/2015   Procedure: INCISION AND DRAINAGE ABSCESS;  Surgeon: Leanora Cover, MD;  Location: [facility];  Service: Orthopedics;  Laterality: Bilateral;  . [place]  . LITHOTRIPSY     4x prior as of 05/2014   ROS as in subjective    Objective: BP 110/70   Pulse 61   Temp 98.2 F (36.8 C)   Ht 5\' 2"  (1.575 m)   Wt 108 lb 3.2 oz (49.1 kg)   LMP 10/31/2016   SpO2 97%   BMI 19.79 kg/m   Wt Readings from Last 3 Encounters:  01/04/19 108 lb 3.2 oz (49.1 kg)  12/30/18 107 lb (48.5 kg)  09/14/18 102 lb (46.3 kg)      General appearance: alert, no distress, WD/WN,  HEENT: normocephalic, sclerae anicteric, PERRLA, EOMi, nares patent, no discharge or erythema, pharynx normal Oral cavity: MMM, no lesions Neck: supple, no lymphadenopathy, no thyromegaly, no masses Heart: RRR, normal S1, S2, no murmurs Lungs: CTA bilaterally, no wheezes, rhonchi, or rales Abdomen: +bs, soft, non tender, non distended, no masses, no hepatomegaly, no splenomegaly Back: non tender Musculoskeletal: nontender, no swelling, no obvious deformity Extremities: no edema, no cyanosis, no clubbing Pulses: 2+ symmetric, upper and lower extremities, normal cap refill Neurological: alert, oriented x 3, CN2-12 intact, strength normal upper extremities and lower extremities, sensation normal throughout, DTRs 2+ throughout, no cerebellar signs, gait normal Psychiatric: normal affect, behavior normal, pleasant      Assessment: Encounter Diagnoses  Name Primary?  . Fatigue, unspecified type Yes  . Palpitation   . Sleep disturbance   . Attention and concentration deficit   . Anxiety   . Moderate persistent asthma, unspecified whether complicated      Plan: We discussed her symptoms and concerns.  We discussed numerous things that can cause fatigue.  We discussed that we could evaluate for certain causes of fatigue with labs such as screening for anemia, thyroid disease, electrolyte disturbance,  B12 deficiency.  She is already scheduled for physical in about 2 weeks so she wants to defer those labs until the physical but we will go ahead and check a Covid antibody just in case she had been exposed.  Covid infection can cause extreme fatigue but she denies any recent illness symptoms.  We discussed anxiety, sleep.  It seems a large component of her symptoms are probably anxiety related.  We discussed stress reduction, not watching his much news and not getting upset by all the stress and politics news right now.  We discussed sleep hygiene.  We discussed possibly starting back on paroxetine that she used in the past.  I reviewed her echocardiogram and stress test she had August 2019 that was normal  Rafa was seen today for fatigue.  Diagnoses and all orders for this visit:  Fatigue, unspecified type -     Cancel: CBC -     Cancel: Basic metabolic panel -     Cancel: TSH -     Cancel: Sedimentation rate -     Cancel: ANA -     SAR CoV2 Serology (COVID 19)AB(IGG)IA  Palpitation -     Cancel: CBC -     Cancel: Basic metabolic panel -     Cancel: TSH -     Cancel: Sedimentation rate -     Cancel: ANA -     SAR CoV2 Serology (COVID 19)AB(IGG)IA  Sleep disturbance -     Cancel: CBC -     Cancel: Basic metabolic panel -     Cancel: TSH -     Cancel: Sedimentation rate -     Cancel: ANA  Attention and concentration deficit -     Cancel: CBC -     Cancel: Basic metabolic panel -      Cancel: TSH -     Cancel: Sedimentation rate -     Cancel: ANA  Anxiety -     Cancel: CBC -     Cancel: Basic metabolic panel -     Cancel: TSH -     Cancel: Sedimentation rate -     Cancel: ANA  Moderate persistent asthma, unspecified whether complicated -     Cancel: CBC -     Cancel: Basic metabolic panel -     Cancel: TSH -     Cancel: Sedimentation rate -     Cancel: ANA -     SAR CoV2 Serology (COVID 19)AB(IGG)IA

## 2019-01-05 LAB — SAR COV2 SEROLOGY (COVID19)AB(IGG),IA

## 2019-01-05 LAB — EUROIMMUN SARS-COV-2 AB, IGG: Euroimmun SARS-CoV-2 Ab, IgG: NEGATIVE

## 2019-01-06 ENCOUNTER — Other Ambulatory Visit: Payer: Self-pay | Admitting: Medical

## 2019-01-06 MED ORDER — LORAZEPAM 0.5 MG PO TABS: 0.5000 mg | ORAL_TABLET | Freq: Two times a day (BID) | ORAL | 1 refills | Status: DC | PRN

## 2019-01-24 ENCOUNTER — Encounter: Payer: Self-pay | Admitting: Medical

## 2019-01-24 ENCOUNTER — Other Ambulatory Visit: Payer: Self-pay

## 2019-01-24 ENCOUNTER — Ambulatory Visit (INDEPENDENT_AMBULATORY_CARE_PROVIDER_SITE_OTHER): Payer: 59 | Admitting: Medical

## 2019-01-24 VITALS — BP 124/66 | HR 66 | Temp 97.3°F | Ht 62.0 in | Wt 108.6 lb

## 2019-01-24 DIAGNOSIS — Z0000 Encounter for general adult medical examination without abnormal findings: Secondary | ICD-10-CM | POA: Diagnosis not present

## 2019-01-24 DIAGNOSIS — K219 Gastro-esophageal reflux disease without esophagitis: Secondary | ICD-10-CM

## 2019-01-24 DIAGNOSIS — Z87442 Personal history of urinary calculi: Secondary | ICD-10-CM

## 2019-01-24 DIAGNOSIS — K589 Irritable bowel syndrome without diarrhea: Secondary | ICD-10-CM

## 2019-01-24 DIAGNOSIS — R1013 Epigastric pain: Secondary | ICD-10-CM

## 2019-01-24 DIAGNOSIS — M549 Dorsalgia, unspecified: Secondary | ICD-10-CM

## 2019-01-24 DIAGNOSIS — J301 Allergic rhinitis due to pollen: Secondary | ICD-10-CM | POA: Diagnosis not present

## 2019-01-24 DIAGNOSIS — M4125 Other idiopathic scoliosis, thoracolumbar region: Secondary | ICD-10-CM

## 2019-01-24 DIAGNOSIS — E559 Vitamin D deficiency, unspecified: Secondary | ICD-10-CM

## 2019-01-24 DIAGNOSIS — G8929 Other chronic pain: Secondary | ICD-10-CM

## 2019-01-24 DIAGNOSIS — R140 Abdominal distension (gaseous): Secondary | ICD-10-CM

## 2019-01-24 DIAGNOSIS — F419 Anxiety disorder, unspecified: Secondary | ICD-10-CM

## 2019-01-24 DIAGNOSIS — M810 Age-related osteoporosis without current pathological fracture: Secondary | ICD-10-CM

## 2019-01-24 DIAGNOSIS — R5383 Other fatigue: Secondary | ICD-10-CM

## 2019-01-24 DIAGNOSIS — Z7189 Other specified counseling: Secondary | ICD-10-CM

## 2019-01-24 DIAGNOSIS — J4530 Mild persistent asthma, uncomplicated: Secondary | ICD-10-CM | POA: Diagnosis not present

## 2019-01-24 DIAGNOSIS — Z7185 Encounter for immunization safety counseling: Secondary | ICD-10-CM

## 2019-01-24 MED ORDER — DICLOFENAC SODIUM 75 MG PO TBEC: 75.0000 mg | DELAYED_RELEASE_TABLET | Freq: Two times a day (BID) | ORAL | 2 refills | Status: AC

## 2019-01-24 MED ORDER — CALCIUM CARBONATE 600 MG PO TABS: 600.0000 mg | ORAL_TABLET | Freq: Two times a day (BID) | ORAL | 3 refills | Status: DC

## 2019-01-24 NOTE — Progress Notes (Signed)
Subjective:   HPI  [name] is a 58 y.o. female who presents for Chief Complaint  Patient presents with  . Annual Exam    Patient Care Team: Tysinger, Leward Quan as PCP - General (Family Medicine) Sees dentist Sees eye doctor Gynecology, [facility], Dr. Alfred Levins GI: Dr. Zenovia Jarred Allergy/Asthma - Dr. Prudy Feeler Dermatology Ortho, Dr. Leanora Cover   Concerns: I saw her recently for chronic fatigue concerns.  She notes ongoing problems with this.  She notes that she has trouble focusing and concentrating throughout the day and this has been going on for years.  No prior treatment or diagnosis of ADD.  No prior sleep study.  She is not sure about snoring.  Some days she wakes up rested some days not.  Sometimes she awakes in the night with a pounding of her chest although her pulse rate is normal.  No family history of sleep apnea.  No prior neurology consult.  Feels like her fatigue can be debilitating   Reviewed their medical, surgical, family, social, medication, and allergy history and updated chart as appropriate.  Past Medical History:  Diagnosis Date  . Allergy   . Anxiety   . Asthma   . Chronic back pain   . Depression   . Eosinophilic esophagitis   . Esophageal spasm   . Esophageal stricture   . Gastritis   . GERD (gastroesophageal reflux disease)   . Headache    occasional  . IBS (irritable bowel syndrome)   . Kidney stone 2015   gets every few years, hx/o calcium oxalate stones  . Osteoporosis   . Scoliosis   . Seasonal allergic conjunctivitis   . Seasonal allergic rhinitis   . Small intestinal bacterial overgrowth   . Vitamin D deficiency   . Wears glasses     Past Surgical History:  Procedure Laterality Date  . APPENDECTOMY    . BREAST EXCISIONAL BIOPSY     left  . COLONOSCOPY  2013   [place], [place]  . COLONOSCOPY  08/2018   [place], diverticula  . ESOPHAGEAL DILATION     several times prior, [place], [place]  . ESOPHAGOPLASTY   0000000   complication of esophageal dilitation, hospitalization at [facility] Right 12/31/2017   Procedure: EXCISION RIGHT WRIST DORSAL GANGLION;  Surgeon: Leanora Cover, MD;  Location: [facility];  Service: Orthopedics;  Laterality: Right;  Bier block  . INCISION AND DRAINAGE ABSCESS Bilateral 09/14/2015   Procedure: INCISION AND DRAINAGE ABSCESS;  Surgeon: Leanora Cover, MD;  Location: [facility];  Service: Orthopedics;  Laterality: Bilateral;  . [place]  . LITHOTRIPSY     4x prior as of 05/2014    Social History   Socioeconomic History  . Marital status: Single    Spouse name: Not on file  . Number of children: 0  . Years of education: Not on file  . Highest education level: Not on file  Occupational History  . Occupation: Tax adviser    Comment: Midwife  Social Needs  . Financial resource strain: Not on file  . Food insecurity    Worry: Not on file    Inability: Not on file  . Transportation needs    Medical: Not on file    Non-medical: Not on file  Tobacco Use  . Smoking status: Never Smoker  . Smokeless tobacco: Never Used  Substance and Sexual Activity  .  Alcohol use: Yes    Alcohol/week: 7.0 standard drinks    Types: 7 Glasses of wine per week  . Drug use: No  . Sexual activity: Not on file  Lifestyle  . Physical activity    Days per week: Not on file    Minutes per session: Not on file  . Stress: Not on file  Relationships  . Social Herbalist on phone: Not on file    Gets together: Not on file    Attends religious service: Not on file    Active member of club or organization: Not on file    Attends meetings of clubs or organizations: Not on file    Relationship status: Not on file  . Intimate partner violence    Fear of current or ex partner: Not on file    Emotionally abused: Not on file    Physically abused: Not on file    Forced sexual  activity: Not on file  Other Topics Concern  . Not on file  Social History Narrative   2 dogs, muts Sherburn and Wheeler, exercise 3-4 days per week with walking, Yoga, weights, CPA.  Customer service manager at [employer]    Family History  Problem Relation Age of Onset  . Barrett's esophagus Mother   . Diabetes Father   . Kidney Stones Father   . Hypertension Father   . Heart attack Father   . Heart disease Father   . Diabetes Sister   . Kidney Stones Brother   . Migraines Brother   . Heart disease Maternal Grandfather   . Barrett's esophagus Paternal Grandfather   . Cancer Neg Hx   . Stroke Neg Hx   . Colon cancer Neg Hx   . Esophageal cancer Neg Hx   . Stomach cancer Neg Hx   . Rectal cancer Neg Hx      Current Outpatient Medications:  .  albuterol (VENTOLIN HFA) 108 (90 Base) MCG/ACT inhaler, Inhale 2 puffs into the lungs every 6 (six) hours as needed for wheezing or shortness of breath., Disp: 1 Inhaler, Rfl: 1 .  cholecalciferol (VITAMIN D3) 25 MCG (1000 UT) tablet, Take 2,000 Units by mouth daily., Disp: , Rfl:  .  estradiol (VIVELLE-DOT) 0.025 MG/24HR, APPLY 1 PATCH TWICE A WEEK, Disp: , Rfl:  .  fluticasone (FLONASE) 50 MCG/ACT nasal spray, Place 2 sprays into both nostrils daily., Disp: 16 g, Rfl: 5 .  fluticasone furoate-vilanterol (BREO ELLIPTA) 200-25 MCG/INH AEPB, Inhale 1 puff into the lungs daily., Disp: , Rfl:  .  LORazepam (ATIVAN) 0.5 MG tablet, Take 1 tablet (0.5 mg total) by mouth 2 (two) times daily as needed for anxiety., Disp: 60 tablet, Rfl: 1 .  montelukast (SINGULAIR) 10 MG tablet, Take 1 tablet (10 mg total) by mouth at bedtime., Disp: 90 tablet, Rfl: 1 .  pantoprazole (PROTONIX) 40 MG tablet, TAKE 1 TABLET BY MOUTH EVERY DAY, Disp: 90 tablet, Rfl: 1 .  Peak Flow Meter DEVI, 1 each by Does not apply route daily., Disp: 1 each, Rfl: 1 .  progesterone (PROMETRIUM) 200 MG capsule, TAKE 1 CAPSULE BY MOUTH EVERY DAY FOR 12 DAYS, Disp: , Rfl:  .  calcium  carbonate (CALCIUM 600) 600 MG TABS tablet, Take 1 tablet (600 mg total) by mouth 2 (two) times daily with a meal., Disp: 180 tablet, Rfl: 3 .  diclofenac (VOLTAREN) 75 MG EC tablet, Take 1 tablet (75 mg total) by mouth 2 (two) times daily., Disp: 45  tablet, Rfl: 2 .  methocarbamol (ROBAXIN) 500 MG tablet, Take 1 tablet (500 mg total) by mouth 2 (two) times daily as needed (back pain). (Patient not taking: Reported on 01/04/2019), Disp: 45 tablet, Rfl: 2  No Known Allergies   Review of Systems Constitutional: -fever, -chills, -sweats, -unexpected weight change, -decreased appetite, +fatigue Allergy: -sneezing, -itching, -congestion Dermatology: -changing moles, --rash, -lumps ENT: -runny nose, -ear pain, -sore throat, -hoarseness, -sinus pain, -teeth pain, - ringing in ears, -hearing loss, -nosebleeds Cardiology: -chest pain, +palpitations, -swelling, -difficulty breathing when lying flat, -waking up short of breath Respiratory: -cough, +shortness of breath, -difficulty breathing with exercise or exertion, -wheezing, -coughing up blood Gastroenterology: -abdominal pain, -nausea, -vomiting, -diarrhea, -constipation, -blood in stool, -changes in bowel movement, -difficulty swallowing or eating Hematology: -bleeding, -bruising  Musculoskeletal: -joint aches, -muscle aches, -joint swelling, -back pain, -neck pain, -cramping, -changes in gait Ophthalmology: denies vision changes, eye redness, itching, discharge Urology: -burning with urination, -difficulty urinating, -blood in urine, -urinary frequency, -urgency, -incontinence Neurology: -headache, -weakness, -tingling, -numbness, -memory loss, -falls, -dizziness Psychology: -depressed mood, -agitation, +sleep problems Breast/gyn: -breast tendnerss, -discharge, -lumps, -vaginal discharge,- irregular periods, -heavy periods     Objective:  BP 124/66   Pulse 66   Temp (!) 97.3 F (36.3 C)   Ht 5\' 2"  (1.575 m)   Wt 108 lb 9.6 oz (49.3 kg)    LMP 10/31/2016   SpO2 98%   BMI 19.86 kg/m    Wt Readings from Last 3 Encounters:  01/24/19 108 lb 9.6 oz (49.3 kg)  01/04/19 108 lb 3.2 oz (49.1 kg)  12/30/18 107 lb (48.5 kg)   General appearance: alert, no distress, WD/WN, Caucasian female Skin: scattered macules, no worrisome lesions HEENT: normocephalic, conjunctiva/corneas normal, sclerae anicteric, PERRLA, EOMi, nares patent, no discharge or erythema, pharynx normal Oral cavity: MMM, tongue normal, teeth normal Neck: supple, no lymphadenopathy, no thyromegaly, no masses, normal ROM, no bruits Chest: non tender, normal shape and expansion Heart: RRR, normal S1, S2, no murmurs Lungs: CTA bilaterally, no wheezes, rhonchi, or rales Abdomen: +bs, soft, non tender, non distended, no masses, no hepatomegaly, no splenomegaly, no bruits Back: non tender, normal ROM, no scoliosis Musculoskeletal: upper extremities non tender, no obvious deformity, normal ROM throughout, lower extremities non tender, no obvious deformity, normal ROM throughout Extremities: no edema, no cyanosis, no clubbing Pulses: 2+ symmetric, upper and lower extremities, normal cap refill Neurological: alert, oriented x 3, CN2-12 intact, strength normal upper extremities and lower extremities, sensation normal throughout, DTRs 2+ throughout, no cerebellar signs, gait normal Psychiatric: normal affect, behavior normal, pleasant  Breast/gyn/rectal - deferred to gynecology     Assessment and Plan :   Encounter Diagnoses  Name Primary?  . Routine general medical examination at a health care facility Yes  . Vitamin D deficiency   . Mild persistent asthma without complication   . Allergic rhinitis due to pollen, unspecified seasonality   . Gastroesophageal reflux disease without esophagitis   . Irritable bowel syndrome, unspecified type   . Fatigue, unspecified type   . Anxiety   . Vaccine counseling   . Bloating   . Abdominal pain, epigastric   . History of  renal calculi   . Chronic back pain, unspecified back location, unspecified back pain laterality   . Other idiopathic scoliosis, thoracolumbar region   . Osteoporosis, unspecified osteoporosis type, unspecified pathological fracture presence     Physical exam - discussed and counseled on healthy lifestyle, diet, exercise, preventative care, vaccinations, sick and  well care, proper use of emergency dept and after hours care, and addressed their concerns.    Health screening: Advised they see their eye doctor yearly for routine vision care. Advised they see their dentist yearly for routine dental care including hygiene visits twice yearly. See your gynecologist yearly for routine gynecological care.  Cancer screening Counseled on self breast exams, mammograms, cervical cancer screening  Colonoscopy:  Reviewed colonoscopy on file that is up to date 08/2018  We will request 2020 pap, mammo and bone density from [facility]   Vaccinations: Advised yearly influenza vaccine, pneumococcal 23 and shingrix.  She will consider but declines today   Separate significant issues discussed:  Chronic fatigue, lack of focus and attention deficit-labs today, we discussed possible causes.  Consider sleep study, consider trial of stimulant medication, consider other eval with asthma specialist including alpha antitrypsin deficiency lab, consider autoimmune issues.  Continue current medications  I refilled her diclofenac care request for intermittent use for chronic back pain    Darlette was seen today for annual exam.  Diagnoses and all orders for this visit:  Routine general medical examination at a health care facility -     Comprehensive metabolic panel -     CBC with Differential/Platelet -     TSH -     Vitamin B12 -     T4, free -     Lipid panel  Vitamin D deficiency  Mild persistent asthma without complication  Allergic rhinitis due to pollen, unspecified  seasonality  Gastroesophageal reflux disease without esophagitis  Irritable bowel syndrome, unspecified type  Fatigue, unspecified type -     Comprehensive metabolic panel -     CBC with Differential/Platelet -     TSH -     Vitamin B12 -     T4, free  Anxiety  Vaccine counseling  Bloating  Abdominal pain, epigastric  History of renal calculi  Chronic back pain, unspecified back location, unspecified back pain laterality  Other idiopathic scoliosis, thoracolumbar region  Osteoporosis, unspecified osteoporosis type, unspecified pathological fracture presence -     TSH  Other orders -     diclofenac (VOLTAREN) 75 MG EC tablet; Take 1 tablet (75 mg total) by mouth 2 (two) times daily. -     calcium carbonate (CALCIUM 600) 600 MG TABS tablet; Take 1 tablet (600 mg total) by mouth 2 (two) times daily with a meal.    Follow-up pending labs, yearly for physical

## 2019-01-25 ENCOUNTER — Other Ambulatory Visit: Payer: Self-pay | Admitting: Medical

## 2019-01-25 LAB — CBC WITH DIFFERENTIAL/PLATELET
Basophils Absolute: 0.1 10*3/uL (ref 0.0–0.2)
Basos: 1 %
EOS (ABSOLUTE): 0.1 10*3/uL (ref 0.0–0.4)
Eos: 2 %
Hematocrit: 40.1 % (ref 34.0–46.6)
Hemoglobin: 13.6 g/dL (ref 11.1–15.9)
Immature Grans (Abs): 0 10*3/uL (ref 0.0–0.1)
Immature Granulocytes: 0 %
Lymphocytes Absolute: 1.4 10*3/uL (ref 0.7–3.1)
Lymphs: 29 %
MCH: 31.6 pg (ref 26.6–33.0)
MCHC: 33.9 g/dL (ref 31.5–35.7)
MCV: 93 fL (ref 79–97)
Monocytes Absolute: 0.3 10*3/uL (ref 0.1–0.9)
Monocytes: 6 %
Neutrophils Absolute: 3.1 10*3/uL (ref 1.4–7.0)
Neutrophils: 62 %
Platelets: 343 10*3/uL (ref 150–450)
RBC: 4.3 x10E6/uL (ref 3.77–5.28)
RDW: 11.6 % — ABNORMAL LOW (ref 11.7–15.4)
WBC: 4.9 10*3/uL (ref 3.4–10.8)

## 2019-01-25 LAB — COMPREHENSIVE METABOLIC PANEL
ALT: 15 IU/L (ref 0–32)
AST: 20 IU/L (ref 0–40)
Albumin/Globulin Ratio: 1.9 (ref 1.2–2.2)
Albumin: 4.5 g/dL (ref 3.8–4.9)
Alkaline Phosphatase: 87 IU/L (ref 39–117)
BUN/Creatinine Ratio: 17 (ref 9–23)
BUN: 16 mg/dL (ref 6–24)
Bilirubin Total: 0.4 mg/dL (ref 0.0–1.2)
CO2: 25 mmol/L (ref 20–29)
Calcium: 9.7 mg/dL (ref 8.7–10.2)
Chloride: 104 mmol/L (ref 96–106)
Creatinine, Ser: 0.96 mg/dL (ref 0.57–1.00)
GFR calc Af Amer: 76 mL/min/{1.73_m2} (ref >59)
GFR calc non Af Amer: 66 mL/min/{1.73_m2} (ref >59)
Globulin, Total: 2.4 g/dL (ref 1.5–4.5)
Glucose: 86 mg/dL (ref 65–99)
Potassium: 4.6 mmol/L (ref 3.5–5.2)
Sodium: 140 mmol/L (ref 134–144)
Total Protein: 6.9 g/dL (ref 6.0–8.5)

## 2019-01-25 LAB — LIPID PANEL
Chol/HDL Ratio: 2.5 ratio (ref 0.0–4.4)
Cholesterol, Total: 218 mg/dL — ABNORMAL HIGH (ref 100–199)
HDL: 88 mg/dL (ref >39)
LDL Chol Calc (NIH): 121 mg/dL — ABNORMAL HIGH (ref 0–99)
Triglycerides: 52 mg/dL (ref 0–149)
VLDL Cholesterol Cal: 9 mg/dL (ref 5–40)

## 2019-01-25 LAB — T4, FREE: Free T4: 1.29 ng/dL (ref 0.82–1.77)

## 2019-01-25 LAB — TSH: TSH: 1.46 u[IU]/mL (ref 0.450–4.500)

## 2019-01-25 LAB — VITAMIN B12: Vitamin B-12: 618 pg/mL (ref 232–1245)

## 2019-01-25 MED ORDER — VITAMIN D 25 MCG (1000 UNIT) PO TABS: 2000.0000 [IU] | ORAL_TABLET | Freq: Every day | ORAL | 3 refills | Status: AC

## 2019-01-25 MED ORDER — PANTOPRAZOLE SODIUM 40 MG PO TBEC: 40.0000 mg | DELAYED_RELEASE_TABLET | Freq: Every day | ORAL | 1 refills | Status: DC

## 2019-01-28 ENCOUNTER — Telehealth: Payer: Self-pay | Admitting: Medical

## 2019-01-28 DIAGNOSIS — G479 Sleep disorder, unspecified: Secondary | ICD-10-CM

## 2019-01-28 DIAGNOSIS — R5382 Chronic fatigue, unspecified: Secondary | ICD-10-CM

## 2019-01-28 NOTE — Telephone Encounter (Signed)
Please schedule her for sleep study through [facility]

## 2019-01-28 NOTE — Telephone Encounter (Signed)
Done

## 2019-02-02 ENCOUNTER — Telehealth: Payer: Self-pay | Admitting: Medical

## 2019-02-02 NOTE — Telephone Encounter (Signed)
Received at call from [facility] in reference to a continuity of care records request. They state no info on PCP. Pt will need to sign at medical records release.

## 2019-03-28 ENCOUNTER — Other Ambulatory Visit: Payer: Self-pay | Admitting: Medical

## 2019-04-06 ENCOUNTER — Ambulatory Visit: Payer: 59 | Admitting: Allergy

## 2019-04-21 ENCOUNTER — Ambulatory Visit: Payer: 59 | Admitting: Allergy

## 2019-04-21 ENCOUNTER — Encounter: Payer: Self-pay | Admitting: Allergy

## 2019-04-21 ENCOUNTER — Other Ambulatory Visit: Payer: Self-pay

## 2019-04-21 VITALS — BP 98/64 | HR 62 | Temp 97.9°F | Resp 18 | Ht 62.0 in

## 2019-04-21 DIAGNOSIS — H1013 Acute atopic conjunctivitis, bilateral: Secondary | ICD-10-CM | POA: Diagnosis not present

## 2019-04-21 DIAGNOSIS — J4540 Moderate persistent asthma, uncomplicated: Secondary | ICD-10-CM | POA: Diagnosis not present

## 2019-04-21 DIAGNOSIS — J3089 Other allergic rhinitis: Secondary | ICD-10-CM

## 2019-04-21 NOTE — Progress Notes (Signed)
Follow-up Note  RE: [name] MRN: [id number] DOB: Aug 11, 1960 Date of Office Visit: 04/21/2019   History of present illness: [name] is a 59 y.o. female presenting today for follow-up of asthma, allergic rhinitis with conjunctivitis.  She was last seen in the office on 12/30/2018 by myself. She is still having issues with her asthma control.  She is on Breo 1 puff daily as well as Singulair.  Lately she has been needing to use her albuterol more often.  No nighttime awakenings and no ED or urgent care visits or any systemic steroid needs.  We have previously discussed the options of Xolair for stepping up therapy which at this time she is very cautious about this option. For allergy symptom control she does have access to Zyrtec for as needed use as well as a nasal steroid spray and has been advised can use over-the-counter Pataday for any ocular symptoms.  She would like to receive the Covid vaccine once available for her to get it.    Review of systems: Review of Systems  Constitutional: Negative.   HENT: Negative.   Eyes: Negative.   Respiratory: Positive for cough and shortness of breath.   Cardiovascular: Negative.   Gastrointestinal: Negative.   Musculoskeletal: Negative.   Skin: Negative.   Neurological: Negative.     All other systems negative unless noted above in HPI  Past medical/social/surgical/family history have been reviewed and are unchanged unless specifically indicated below.  No changes  Medication List: Current Outpatient Medications  Medication Sig Dispense Refill  . albuterol (VENTOLIN HFA) 108 (90 Base) MCG/ACT inhaler Inhale 2 puffs into the lungs every 6 (six) hours as needed for wheezing or shortness of breath. 1 Inhaler 1  . BREO ELLIPTA 200-25 MCG/INH AEPB INHALE 1 PUFF INTO THE LUNGS TWICE A DAY 60 each 2  . calcium carbonate (CALCIUM 600) 600 MG TABS tablet Take 1 tablet (600 mg total) by mouth 2 (two) times daily with a meal. 180  tablet 3  . cholecalciferol (VITAMIN D3) 25 MCG (1000 UT) tablet Take 2 tablets (2,000 Units total) by mouth daily. 180 tablet 3  . diclofenac (VOLTAREN) 75 MG EC tablet Take 1 tablet (75 mg total) by mouth 2 (two) times daily. 45 tablet 2  . estradiol (VIVELLE-DOT) 0.025 MG/24HR APPLY 1 PATCH TWICE A WEEK    . fluticasone (FLONASE) 50 MCG/ACT nasal spray Place 2 sprays into both nostrils daily. 16 g 5  . LORazepam (ATIVAN) 0.5 MG tablet Take 1 tablet (0.5 mg total) by mouth 2 (two) times daily as needed for anxiety. 60 tablet 1  . methocarbamol (ROBAXIN) 500 MG tablet Take 1 tablet (500 mg total) by mouth 2 (two) times daily as needed (back pain). 45 tablet 2  . montelukast (SINGULAIR) 10 MG tablet Take 1 tablet (10 mg total) by mouth at bedtime. 90 tablet 1  . pantoprazole (PROTONIX) 40 MG tablet Take 1 tablet (40 mg total) by mouth daily. 90 tablet 1  . Peak Flow Meter DEVI 1 each by Does not apply route daily. 1 each 1  . progesterone (PROMETRIUM) 200 MG capsule TAKE 1 CAPSULE BY MOUTH EVERY DAY FOR 12 DAYS     No current facility-administered medications for this visit.     Known medication allergies: No Known Allergies   Physical examination: Blood pressure 98/64, pulse 62, temperature 97.9 F (36.6 C), temperature source Temporal, resp. rate 18, height 5\' 2"  (1.575 m), last menstrual period 10/31/2016, SpO2 97 %.  General: Alert, interactive, in no acute distress. HEENT: PERRLA, TMs pearly gray, turbinates non-edematous without discharge, post-pharynx non erythematous. Neck: Supple without lymphadenopathy. Lungs: Clear to auscultation without wheezing, rhonchi or rales. {no increased work of breathing. CV: Normal S1, S2 without murmurs. Abdomen: Nondistended, nontender. Skin: Warm and dry, without lesions or rashes. Extremities:  No clubbing, cyanosis or edema. Neuro:   Grossly intact.  Diagnositics/Labs:  Spirometry: FEV1: 2.19L 104%, FVC: 3.15L 127%, ratio consistent with  nonobstructive pattern  Assessment and plan:   Asthma, mod persistent  - still not under good control  - have access to albuterol inhaler 2 puffs every 4-6 hours as needed for cough/wheeze/shortness of breath/chest tightness.  May use 15-20 minutes prior to activity.   Monitor frequency of use.    - step-up therapy to Trelegy (triple therapy inhaler) use 1 puff once a day.   stop Breo while using Trelegy.   If more effective in decreasing symptoms and decreasing albuterol use let us know and will send in a prescription.  Provided with sample today.  - continue Singulair 10 mg daily at bedtime  - for current flare take prednisone pack for next 5 days  - if asthma symptoms are not controlled with inhaler medication then will consider Xolair vs Dupixent for allergic asthma control.  Both options discussed today including benefits and risks.    Asthma control goals:   Full participation in all desired activities (may need albuterol before activity)  Albuterol use two time or less a week on average (not counting use with activity)  Cough interfering with sleep two time or less a month  Oral steroids no more than once a year  No hospitalizations  Allergic rhinitis with conjunctivitis  - continue avoidance measures for grass pollens, weed pollens, tree pollens, molds, dust mites, cat, dog, horse, mouse and tobacco leaf  - continue Zyrtec 10mg  daily as needed  - continue Flonase 2 sprays each nostril daily for nasal congestion.  Use for 1-2 weeks at a time before stopping once symptoms improve.   - for itchy/watery/red eyes can use OTC Pataday 1 drop each eye daily as needed  - if medication management is not effective in controlling symptoms then consider another course of allergen immunotherapy (allergy shots)  Follow-up 3-4 months or sooner if needed  I appreciate the opportunity to take part in [name]'s care. Please do not hesitate to contact me with questions.  Sincerely,   Prudy Feeler, MD Allergy/Immunology [facility]

## 2019-04-21 NOTE — Patient Instructions (Addendum)
Asthma  - still not under good control  - have access to albuterol inhaler 2 puffs every 4-6 hours as needed for cough/wheeze/shortness of breath/chest tightness.  May use 15-20 minutes prior to activity.   Monitor frequency of use.    - step-up therapy to Trelegy (triple therapy inhaler) use 1 puff once a day.   stop Breo while using Trelegy.   If more effective in decreasing symptoms and decreasing albuterol use let us know and will send in a prescription.   - continue Singulair 10 mg daily at bedtime  - if asthma symptoms are not controlled with inhaler medication then will consider Xolair vs Dupixent for allergic asthma control.  Both options discussed today including benefits and risks.    Asthma control goals:   Full participation in all desired activities (may need albuterol before activity)  Albuterol use two time or less a week on average (not counting use with activity)  Cough interfering with sleep two time or less a month  Oral steroids no more than once a year  No hospitalizations  Allergies   - continue avoidance measures for grass pollens, weed pollens, tree pollens, molds, dust mites, cat, dog, horse, mouse and tobacco leaf  - continue Zyrtec 10mg  daily as needed  - continue Flonase 2 sprays each nostril daily for nasal congestion.  Use for 1-2 weeks at a time before stopping once symptoms improve.   - for itchy/watery/red eyes can use OTC Pataday 1 drop each eye daily as needed  - if medication management is not effective in controlling symptoms then consider another course of allergen immunotherapy (allergy shots)  Follow-up 3-4 months or sooner if needed

## 2019-04-28 ENCOUNTER — Ambulatory Visit (HOSPITAL_BASED_OUTPATIENT_CLINIC_OR_DEPARTMENT_OTHER): Payer: 59 | Attending: Medical | Admitting: Internal Medicine

## 2019-04-28 ENCOUNTER — Other Ambulatory Visit: Payer: Self-pay

## 2019-04-28 DIAGNOSIS — R0683 Snoring: Secondary | ICD-10-CM | POA: Insufficient documentation

## 2019-04-28 DIAGNOSIS — R5382 Chronic fatigue, unspecified: Secondary | ICD-10-CM | POA: Diagnosis not present

## 2019-04-28 DIAGNOSIS — G479 Sleep disorder, unspecified: Secondary | ICD-10-CM | POA: Diagnosis not present

## 2019-05-02 ENCOUNTER — Other Ambulatory Visit: Payer: Self-pay | Admitting: *Deleted

## 2019-05-02 ENCOUNTER — Telehealth: Payer: Self-pay | Admitting: Allergy

## 2019-05-02 MED ORDER — TRELEGY ELLIPTA 200-62.5-25 MCG/INH IN AEPB: 1.0000 | INHALATION_SPRAY | Freq: Every day | RESPIRATORY_TRACT | 5 refills | Status: DC

## 2019-05-02 NOTE — Telephone Encounter (Signed)
Patient was given a sample of Trelogy at her last visit. She said it works and would now like a prescription. [facility] [place] [address].

## 2019-05-02 NOTE — Telephone Encounter (Signed)
Called and confirmed with patient which strength Trelegy was given and she stated the 200 was given. A prescription has been sent in to the requested pharmacy Per Dr. Jeralyn Ruths office note. Called patient and advised. Patient verbalized understanding.

## 2019-05-04 NOTE — Telephone Encounter (Signed)
Patient called back stating that she would like a sample of Trelegy 200 until she can find a pharmacy that has it. Patient states she called [facility] in [place] and they has Trelegy 100 not Trelegy 200.   Patient would like to pick up sample tomorrow.

## 2019-05-04 NOTE — Telephone Encounter (Signed)
Left message sample is already up front for patient to collect.

## 2019-05-04 NOTE — Telephone Encounter (Signed)
Fax from pharmacy.  Trelegy is not available at this store.  Per Christa at the pharmacy.  The warehouse is not sending this medication to their store right now.  "not enough allocations at this store", so they are low on the priority list to be sent to.  Christa advised that we look into other pharmacies to obtain product.  Call to patient to ask her if she could call a different pharmacy to find the Trelegy 200.   Also, let her know that she can pick up a sample from the front desk to hold her over until she finds a different pharmacy. (message was left on her voicemail.)

## 2019-05-07 DIAGNOSIS — R5382 Chronic fatigue, unspecified: Secondary | ICD-10-CM

## 2019-05-07 DIAGNOSIS — G479 Sleep disorder, unspecified: Secondary | ICD-10-CM | POA: Diagnosis not present

## 2019-05-07 NOTE — Procedures (Signed)
   Patient Name: Rebecca, Cooke Date: 04/28/2019 Gender: Female D.O.B: [birth year] Age (years): 58 Referring Provider: Chana Bode Height (inches): 39 Interpreting Physician: Baird Lyons MD, ABSM Weight (lbs): 106 RPSGT: Jacolyn Reedy BMI: 19 MRN: [id number] Neck Size: 14.00  CLINICAL INFORMATION Sleep Study Type: HST Indication for sleep study: Fatigue Epworth Sleepiness Score: 8  SLEEP STUDY TECHNIQUE A multi-channel overnight portable sleep study was performed. The channels recorded were: nasal airflow, thoracic respiratory movement, and oxygen saturation with a pulse oximetry. Snoring was also monitored.  MEDICATIONS Patient self administered medications include: none reported.  SLEEP ARCHITECTURE Patient was studied for 543.4 minutes. The sleep efficiency was 97.7 % and the patient was supine for 49.8%. The arousal index was 0.0 per hour.  RESPIRATORY PARAMETERS The overall AHI was 1.5 per hour, with a central apnea index of 0.0 per hour. The oxygen nadir was 84% during sleep.  CARDIAC DATA Mean heart rate during sleep was 59.5 bpm. NSR  IMPRESSIONS - No significant obstructive sleep apnea occurred during this study (AHI = 1.5/h). - No significant central sleep apnea occurred during this study (CAI = 0.0/h). - Oxygen desaturation was noted during this study (Min O2 = 84%). Mean saturation 94%. - Patient snored.  DIAGNOSIS - Normal study  RECOMMENDATIONS - Prior to testing, patient reported history of waking with tachypalpitation, not noted on this study. Consider if rhythm monitoring appropriate. - Be careful with alcohol, sedatives and other CNS depressants that may worsen sleep apnea and disrupt normal sleep architecture. - Sleep hygiene should be reviewed to assess factors that may improve sleep quality. - Weight management and regular exercise should be initiated or continued.  [Electronically signed] 05/07/2019 12:16 PM  Baird Lyons MD,  [place], American Board of Sleep Medicine   NPI: [id number]                          [place], [place] [facility]  ELECTRONICALLY SIGNED ON:  05/07/2019, 12:12 PM [place] PH: [phone]   FX: [phone] [place]

## 2019-05-13 ENCOUNTER — Other Ambulatory Visit: Payer: Self-pay

## 2019-05-13 ENCOUNTER — Ambulatory Visit: Payer: 59 | Admitting: Medical

## 2019-05-13 ENCOUNTER — Encounter: Payer: Self-pay | Admitting: Medical

## 2019-05-13 VITALS — BP 132/68 | HR 67 | Temp 98.0°F | Ht 62.0 in | Wt 109.2 lb

## 2019-05-13 DIAGNOSIS — R002 Palpitations: Secondary | ICD-10-CM

## 2019-05-13 DIAGNOSIS — R5382 Chronic fatigue, unspecified: Secondary | ICD-10-CM | POA: Diagnosis not present

## 2019-05-13 DIAGNOSIS — J301 Allergic rhinitis due to pollen: Secondary | ICD-10-CM | POA: Diagnosis not present

## 2019-05-13 DIAGNOSIS — F419 Anxiety disorder, unspecified: Secondary | ICD-10-CM

## 2019-05-13 DIAGNOSIS — J4530 Mild persistent asthma, uncomplicated: Secondary | ICD-10-CM

## 2019-05-13 NOTE — Progress Notes (Signed)
Subjective:  [name] is a 59 y.o. female who presents for Chief Complaint  Patient presents with  . Anxiety    with sob      Here for several concerns.  She has ongoing problems with fatigue for months.  She stays extremely fatigued.  At night in particular she can wake up feeling like she cannot catch her breath, feels palpitations of the heart sometimes in sleep sometimes during the day.  She saw asthma doctor recently and was changed to Trelegy, and she continues on Singulair, Zyrtec, albuterol as needed  She does feel anxious, ongoing problems with anxiety.  Worries about a lot of stuff.  Uses Ativan sometimes.  Has been seeing a counselor  She notes recently an episode but that her particularly worried she was eating lunch with friends and a little later sitting around after eating lunch ended up falling asleep in the middle of a conversation.  She denies numbness or tingling, weakness.  Does not feel depressed.  In a lot of ways is content , but does feel anxious about a lot of things.  No other aggravating or relieving factors.    No other c/o.  Past Medical History:  Diagnosis Date  . Allergy   . Anxiety   . Asthma   . Chronic back pain   . Depression   . Eosinophilic esophagitis   . Esophageal spasm   . Esophageal stricture   . Gastritis   . GERD (gastroesophageal reflux disease)   . Headache    occasional  . IBS (irritable bowel syndrome)   . Kidney stone 2015   gets every few years, hx/o calcium oxalate stones  . Osteoporosis   . Scoliosis   . Seasonal allergic conjunctivitis   . Seasonal allergic rhinitis   . Small intestinal bacterial overgrowth   . Vitamin D deficiency   . Wears glasses    Current Outpatient Medications on File Prior to Visit  Medication Sig Dispense Refill  . albuterol (VENTOLIN HFA) 108 (90 Base) MCG/ACT inhaler Inhale 2 puffs into the lungs every 6 (six) hours as needed for wheezing or shortness of breath. 1 Inhaler 1  . calcium  carbonate (CALCIUM 600) 600 MG TABS tablet Take 1 tablet (600 mg total) by mouth 2 (two) times daily with a meal. 180 tablet 3  . cholecalciferol (VITAMIN D3) 25 MCG (1000 UT) tablet Take 2 tablets (2,000 Units total) by mouth daily. 180 tablet 3  . estradiol (VIVELLE-DOT) 0.025 MG/24HR APPLY 1 PATCH TWICE A WEEK    . Fluticasone-Umeclidin-Vilant (TRELEGY ELLIPTA) 200-62.5-25 MCG/INH AEPB Inhale 1 puff into the lungs daily. 28 each 5  . LORazepam (ATIVAN) 0.5 MG tablet Take 1 tablet (0.5 mg total) by mouth 2 (two) times daily as needed for anxiety. 60 tablet 1  . montelukast (SINGULAIR) 10 MG tablet Take 1 tablet (10 mg total) by mouth at bedtime. 90 tablet 1  . pantoprazole (PROTONIX) 40 MG tablet Take 1 tablet (40 mg total) by mouth daily. 90 tablet 1  . progesterone (PROMETRIUM) 200 MG capsule TAKE 1 CAPSULE BY MOUTH EVERY DAY FOR 12 DAYS    . BREO ELLIPTA 200-25 MCG/INH AEPB INHALE 1 PUFF INTO THE LUNGS TWICE A DAY (Patient not taking: Reported on 05/13/2019) 60 each 2  . diclofenac (VOLTAREN) 75 MG EC tablet Take 1 tablet (75 mg total) by mouth 2 (two) times daily. (Patient not taking: Reported on 05/13/2019) 45 tablet 2  . fluticasone (FLONASE) 50 MCG/ACT nasal spray Place  2 sprays into both nostrils daily. (Patient not taking: Reported on 05/13/2019) 16 g 5  . methocarbamol (ROBAXIN) 500 MG tablet Take 1 tablet (500 mg total) by mouth 2 (two) times daily as needed (back pain). (Patient not taking: Reported on 05/13/2019) 45 tablet 2  . Peak Flow Meter DEVI 1 each by Does not apply route daily. (Patient not taking: Reported on 05/13/2019) 1 each 1   No current facility-administered medications on file prior to visit.     The following portions of the patient's history were reviewed and updated as appropriate: allergies, current medications, past family history, past medical history, past social history, past surgical history and problem list.  ROS Otherwise as in subjective  above  Objective: BP 132/68   Pulse 67   Temp 98 F (36.7 C)   Ht 5\' 2"  (1.575 m)   Wt 109 lb 3.2 oz (49.5 kg)   LMP 10/31/2016   SpO2 98%   BMI 19.97 kg/m   General appearance: alert, no distress, well developed, well nourished Neck: supple, no lymphadenopathy, no thyromegaly, no masses, no bruits Heart: RRR, normal S1, S2, no murmurs Lungs: CTA bilaterally, no wheezes, rhonchi, or rales Pulses: 2+ radial pulses, 2+ pedal pulses, normal cap refill Ext: no edema Neuro: cn2-12 intact, A&O x3, nonfocal exam, normal strength, sensation, DTRs Psych: tearful at times   EKG Sinus bradycardia, left axis deviation, no acute changes, otherwise normal EKG   Assessment: Encounter Diagnoses  Name Primary?  . Chronic fatigue Yes  . Palpitation   . Mild persistent asthma without complication   . Allergic rhinitis due to pollen, unspecified seasonality   . Anxiety      Plan: We had a lot of discussion today about possible causes, symptoms, next steps.  It is unclear what is going on.  In the event medications are making her feel this way, I advise she stop Singulair for the next 2 weeks since some people get unusual symptoms with this medication.  She will let me know if she feels any differently in the next 2 weeks off Singulair  EKG reviewed today.  I reviewed her sleep study from February 2021 which showed an oxygen nadir of 84% but no prolonged hypoxia in no significant sleep apnea  I reviewed her 2019 stress test and echocardiogram which were normal  I reviewed her recent labs from November 2020 including normal B12, normal thyroid, unremarkable CBC and comprehensive metabolic panel.  There is no current reason to suspect accidental poisoning or tickborne disease or environmental effects in her household.  She has had some tick bites here and there in the past year but no prolonged tick exposure.  We will make a referral to neurology.  Continue counseling.  Discussed ways to  deal with stress and anxiety.  Advise holding off on any other medicine changes till we do these recommendations first.  She may ultimately need to try an SSRI but for now we will use these recommendations above.   Seraiah was seen today for anxiety.  Diagnoses and all orders for this visit:  Chronic fatigue -     EKG 12-Lead -     Ambulatory referral to Neurology  Palpitation -     EKG 12-Lead -     Ambulatory referral to Neurology  Mild persistent asthma without complication  Allergic rhinitis due to pollen, unspecified seasonality  Anxiety -     Ambulatory referral to Neurology    Follow up: with neurology

## 2019-06-01 ENCOUNTER — Telehealth: Payer: Self-pay | Admitting: Medical

## 2019-06-01 DIAGNOSIS — R5382 Chronic fatigue, unspecified: Secondary | ICD-10-CM

## 2019-06-01 NOTE — Telephone Encounter (Signed)
Call and see if she notes any change and fatigue and mood since stopping the Singulair?

## 2019-06-06 NOTE — Telephone Encounter (Signed)
Please refer to neurology as she sees no improvement in chronic fatigue.    [facility]

## 2019-06-06 NOTE — Telephone Encounter (Signed)
Referral has been sent to [facility]  

## 2019-06-21 ENCOUNTER — Telehealth: Payer: Self-pay

## 2019-06-21 NOTE — Telephone Encounter (Signed)
Patient stated she does not need anything to be done at this time and she will call us should she need [place].

## 2019-06-21 NOTE — Telephone Encounter (Signed)
Patient was referred to Neurology with the diagnosis code of "chronic fatigue". They have denied the referral stating that it does not meet requirements. Please advise further.

## 2019-07-06 DIAGNOSIS — Z0289 Encounter for other administrative examinations: Secondary | ICD-10-CM

## 2019-07-20 ENCOUNTER — Other Ambulatory Visit: Payer: Self-pay

## 2019-07-20 ENCOUNTER — Encounter: Payer: Self-pay | Admitting: Medical

## 2019-07-20 ENCOUNTER — Ambulatory Visit: Payer: 59 | Admitting: Medical

## 2019-07-20 VITALS — BP 120/70 | HR 75 | Temp 98.3°F | Ht 62.0 in | Wt 109.0 lb

## 2019-07-20 DIAGNOSIS — R5383 Other fatigue: Secondary | ICD-10-CM

## 2019-07-20 DIAGNOSIS — R202 Paresthesia of skin: Secondary | ICD-10-CM | POA: Diagnosis not present

## 2019-07-20 DIAGNOSIS — F419 Anxiety disorder, unspecified: Secondary | ICD-10-CM

## 2019-07-20 DIAGNOSIS — R400 Somnolence: Secondary | ICD-10-CM | POA: Insufficient documentation

## 2019-07-20 DIAGNOSIS — G4459 Other complicated headache syndrome: Secondary | ICD-10-CM

## 2019-07-20 DIAGNOSIS — J4530 Mild persistent asthma, uncomplicated: Secondary | ICD-10-CM

## 2019-07-20 DIAGNOSIS — J301 Allergic rhinitis due to pollen: Secondary | ICD-10-CM

## 2019-07-20 NOTE — Progress Notes (Signed)
Subjective: Chief Complaint  Patient presents with  . Numbness    facial numbness with dry mouth x3weeks    Here for some recent new symptoms.   I have seen her in recent months for fatigue, somnolence, anxiety.   She notes in recent weeks, has been sleeping better.  But in last 3 week, getting some numbness or tightness in top of head bilat or in jaw bilat.  No other numbness, tingling, weakness, no fall, no confusion,no syncope.  She has been getting some mild headaches as well generalized top of head.   Headaches mild but on average every other day.  She was having episodes of extreme sleepiness/fatigue in recent months.  These has improved a bit of recent month or 2, then has gotten some mild episodes of sleepiness in last 3 weeks.     Headaches are mild generalized top of head without photophobia, phonophobia, nausea, fever, or other symptoms.      Feels stressed and anxious often.    Has had some dry mouth.   For example, earlier today felt dry mouth, followed by weird feeling in head, then started with the numbness.   Allergies and Asthma has been controlled on current therapy.  Vit D deficiency - compliant for the most part, but not always daily.  Last steroid dose fall 2020.  No recent frequent steroids from allergies.t  Denies frequent joint pains.    Past Medical History:  Diagnosis Date  . Allergy   . Anxiety   . Asthma   . Chronic back pain   . Depression   . Eosinophilic esophagitis   . Esophageal spasm   . Esophageal stricture   . Gastritis   . GERD (gastroesophageal reflux disease)   . Headache    occasional  . IBS (irritable bowel syndrome)   . Kidney stone 2015   gets every few years, hx/o calcium oxalate stones  . Osteoporosis   . Scoliosis   . Seasonal allergic conjunctivitis   . Seasonal allergic rhinitis   . Small intestinal bacterial overgrowth   . Vitamin D deficiency   . Wears glasses    Current Outpatient Medications on File Prior to Visit   Medication Sig Dispense Refill  . albuterol (VENTOLIN HFA) 108 (90 Base) MCG/ACT inhaler Inhale 2 puffs into the lungs every 6 (six) hours as needed for wheezing or shortness of breath. 1 Inhaler 1  . calcium carbonate (CALCIUM 600) 600 MG TABS tablet Take 1 tablet (600 mg total) by mouth 2 (two) times daily with a meal. 180 tablet 3  . cholecalciferol (VITAMIN D3) 25 MCG (1000 UT) tablet Take 2 tablets (2,000 Units total) by mouth daily. 180 tablet 3  . estradiol (VIVELLE-DOT) 0.025 MG/24HR APPLY 1 PATCH TWICE A WEEK    . Fluticasone-Umeclidin-Vilant (TRELEGY ELLIPTA) 200-62.5-25 MCG/INH AEPB Inhale 1 puff into the lungs daily. 28 each 5  . LORazepam (ATIVAN) 0.5 MG tablet Take 1 tablet (0.5 mg total) by mouth 2 (two) times daily as needed for anxiety. 60 tablet 1  . montelukast (SINGULAIR) 10 MG tablet Take 10 mg by mouth at bedtime.    . pantoprazole (PROTONIX) 40 MG tablet Take 1 tablet (40 mg total) by mouth daily. 90 tablet 1  . progesterone (PROMETRIUM) 200 MG capsule TAKE 1 CAPSULE BY MOUTH EVERY DAY FOR 12 DAYS    . diclofenac (VOLTAREN) 75 MG EC tablet Take 1 tablet (75 mg total) by mouth 2 (two) times daily. (Patient not taking: Reported on 05/13/2019)  45 tablet 2  . fluticasone (FLONASE) 50 MCG/ACT nasal spray Place 2 sprays into both nostrils daily. (Patient not taking: Reported on 05/13/2019) 16 g 5  . methocarbamol (ROBAXIN) 500 MG tablet Take 1 tablet (500 mg total) by mouth 2 (two) times daily as needed (back pain). (Patient not taking: Reported on 05/13/2019) 45 tablet 2  . Peak Flow Meter DEVI 1 each by Does not apply route daily. (Patient not taking: Reported on 05/13/2019) 1 each 1   No current facility-administered medications on file prior to visit.   ROS as in subjective      Objective: BP 120/70   Pulse 75   Temp 98.3 F (36.8 C)   Ht 5\' 2"  (1.575 m)   Wt 109 lb (49.4 kg)   LMP 10/31/2016   SpO2 97%   BMI 19.94 kg/m   Wt Readings from Last 3 Encounters:   07/20/19 109 lb (49.4 kg)  05/13/19 109 lb 3.2 oz (49.5 kg)  04/28/19 106 lb (48.1 kg)   General appearence: alert, no distress, WD/WN, lean white female HEENT: normocephalic, sclerae anicteric, PERRLA, EOMi, nares patent, no discharge or erythema, pharynx normal Oral cavity: MMM, no lesions Neck: supple, no lymphadenopathy, no thyromegaly, no masses, no bruits Heart: RRR, normal S1, S2, no murmurs Lungs: CTA bilaterally, no wheezes, rhonchi, or rales Extremities: no edema, no cyanosis, no clubbing Pulses: 2+ symmetric, upper and lower extremities, normal cap refill Neurological: alert, oriented x 3, CN2-12 intact, strength normal upper extremities and lower extremities, sensation normal throughout, DTRs 2+ throughout, no cerebellar signs, gait normal Psychiatric: normal affect, behavior normal, pleasant  Skin: unremarkable    Assessment: Encounter Diagnoses  Name Primary?  . Paresthesia Yes  . Other complicated headache syndrome   . Fatigue, unspecified type   . Somnolence   . Mild persistent asthma without complication   . Allergic rhinitis due to pollen, unspecified seasonality   . Anxiety       Plan: Discussed her concerns, symptoms, reviewed recent 04/2019 normal sleep study.  discussed case with Dr. Tomi Bamberger supervising physician.   Discussed differential - autoimmune disease, adrenal insufficiency, anxiety/mood change, migraines, chronic fatigue syndrome, lyme disease, narcolepsy, other neurological issue, dehydration, need for more exercise.    Thankfully she is seeing improvement in anxiety with counseling.  She notes difficulty break up last year after 20 year relationship.     Return tomorrow for fasting labs  Consider neurology or endocrinology consult   [name] was seen today for numbness.  Diagnoses and all orders for this visit:  Paresthesia -     Cortisol; Future -     VITAMIN D 25 Hydroxy (Vit-D Deficiency, Fractures); Future -     Sedimentation rate;  Future -     ANA; Future  Other complicated headache syndrome -     Cortisol; Future -     VITAMIN D 25 Hydroxy (Vit-D Deficiency, Fractures); Future -     Sedimentation rate; Future -     ANA; Future  Fatigue, unspecified type -     Cortisol; Future -     VITAMIN D 25 Hydroxy (Vit-D Deficiency, Fractures); Future -     Sedimentation rate; Future -     ANA; Future  Somnolence -     Cortisol; Future -     VITAMIN D 25 Hydroxy (Vit-D Deficiency, Fractures); Future -     Sedimentation rate; Future -     ANA; Future  Mild persistent asthma without complication  Allergic rhinitis  due to pollen, unspecified seasonality  Anxiety -     Cortisol; Future -     VITAMIN D 25 Hydroxy (Vit-D Deficiency, Fractures); Future -     Sedimentation rate; Future -     ANA; Future  f/u pending labs

## 2019-07-21 ENCOUNTER — Other Ambulatory Visit: Payer: 59

## 2019-07-21 DIAGNOSIS — G4459 Other complicated headache syndrome: Secondary | ICD-10-CM

## 2019-07-21 DIAGNOSIS — R5383 Other fatigue: Secondary | ICD-10-CM

## 2019-07-21 DIAGNOSIS — R202 Paresthesia of skin: Secondary | ICD-10-CM

## 2019-07-21 DIAGNOSIS — F419 Anxiety disorder, unspecified: Secondary | ICD-10-CM

## 2019-07-21 DIAGNOSIS — R400 Somnolence: Secondary | ICD-10-CM

## 2019-07-22 ENCOUNTER — Other Ambulatory Visit: Payer: Self-pay

## 2019-07-22 ENCOUNTER — Ambulatory Visit: Payer: 59 | Admitting: Allergy

## 2019-07-22 ENCOUNTER — Encounter: Payer: Self-pay | Admitting: Allergy

## 2019-07-22 VITALS — BP 130/70 | HR 62 | Temp 98.0°F | Resp 16 | Wt 108.2 lb

## 2019-07-22 DIAGNOSIS — H1010 Acute atopic conjunctivitis, unspecified eye: Secondary | ICD-10-CM | POA: Diagnosis not present

## 2019-07-22 DIAGNOSIS — J4541 Moderate persistent asthma with (acute) exacerbation: Secondary | ICD-10-CM

## 2019-07-22 DIAGNOSIS — J309 Allergic rhinitis, unspecified: Secondary | ICD-10-CM | POA: Diagnosis not present

## 2019-07-22 LAB — ANA: Anti Nuclear Antibody (ANA): NEGATIVE

## 2019-07-22 LAB — SEDIMENTATION RATE: Sed Rate: 2 mm/hr (ref 0–40)

## 2019-07-22 LAB — VITAMIN D 25 HYDROXY (VIT D DEFICIENCY, FRACTURES): Vit D, 25-Hydroxy: 47.9 ng/mL (ref 30.0–100.0)

## 2019-07-22 LAB — CORTISOL: Cortisol: 10.5 ug/dL

## 2019-07-22 NOTE — Patient Instructions (Addendum)
Asthma Continue Trelegy 1 puff once a day to help prevent cough and wheeze.  Rinse mouth out after to help prevent thrush. Continue Singulair 10 mg once a day to help prevent cough and wheeze May use albuterol 2 puffs every 4 hours as needed for coughing, wheezing, tightness in chest, or shortness of breath. Start prednisone- take 10 mg take one tablet twice a day for 4 days, then on the fifth day take 10 mg and then stop. Asthma control goals:   Full participation in all desired activities (may need albuterol before activity)  Albuterol use two time or less a week on average (not counting use with activity)  Cough interfering with sleep two time or less a month  Oral steroids no more than once a year  No hospitalizations Continue to consider the use of Xolair or Dupixent for allergic asthma control  Allergic rhinitis Continue Zyrtec 10 mg once a day as needed for runny nose or itching. Continue Flonase nasal spray 2 sprays each nostril once a day as needed for nasal congestion. May use over-the-counter Pataday eyedrops 1 drop each eye once a day as needed for itchy watery eyes  Continue all other current medications. Please let us know if this treatment plan is not working well for you. Schedule follow-up in 3 months or sooner if needed

## 2019-07-22 NOTE — Progress Notes (Signed)
[place] [address] Dept: [phone]  FOLLOW UP NOTE  Patient ID: [name], female    DOB: 1960/05/31  Age: 59 y.o. MRN: [id number] Date of Office Visit: 07/22/2019  Assessment  Chief Complaint: Asthma  HPI [name] is a 59 year old female who presents today for asthma flareup.  She was last seen on April 21, 2019 by Dr. Nelva Bush.  Asthma is reported as not well controlled.  She reports for the past 3 days she has had a dry cough, tightness in her chest and shortness of breath. She has been using her albuterol 2 times a day and reports that it takes a while for the medication to help with her symptoms.  She denies any wheezing, fevers, chills or recent steroids.  She also has not needed to make any trips to the emergency room or urgent care since her last office visit.  At this time she is still not interested in doing Xolair or Dupixent due to living at home and needing an EpiPen.  She reports that she has received both Pfizer vaccines for COVID-19.  Allergic rhinitis is reported as well controlled with the use of Zyrtec 10 mg once a day and Flonase 2 sprays each nostril once a day.  Conjunctivitis is reported as well controlled.  She reports that she rarely has to use over-the-counter Pataday eyedrops.    Drug Allergies:  No Known Allergies  Physical Exam: BP 130/70 (BP Location: Left Arm, Patient Position: Sitting, Cuff Size: Normal)   Pulse 62   Temp 98 F (36.7 C) (Temporal)   Resp 16   Wt 108 lb 3.2 oz (49.1 kg)   LMP 10/31/2016   SpO2 100%   BMI 19.79 kg/m    Physical Exam Constitutional:      Appearance: Normal appearance.  HENT:     Head: Normocephalic and atraumatic.     Comments: Pharynx normal. Eyes normal. Ears normal. Nose normal    Right Ear: Tympanic membrane, ear canal and external ear normal.     Left Ear: Tympanic membrane, ear canal and external ear normal.     Nose: Nose normal.     Mouth/Throat:     Mouth:  Mucous membranes are moist.  Eyes:     Conjunctiva/sclera: Conjunctivae normal.  Cardiovascular:     Rate and Rhythm: Normal rate and regular rhythm.     Heart sounds: Normal heart sounds.  Pulmonary:     Effort: Pulmonary effort is normal.     Breath sounds: Normal breath sounds.     Comments: Lungs clear to auscultation. Skin:    General: Skin is warm.  Neurological:     General: No focal deficit present.     Mental Status: She is alert and oriented to person, place, and time.  Psychiatric:        Mood and Affect: Mood normal.        Behavior: Behavior normal.        Thought Content: Thought content normal.        Judgment: Judgment normal.     Diagnostics: FVC 3.09 L, FEV1 2.30 L.  Predicted FVC 3.12 L, FEV1 2.43 L.  Spirometry indicates normal ventilatory function.    Assessment and Plan: 1. Moderate persistent asthma with acute exacerbation   2. Allergic rhinoconjunctivitis        Asthma with exacerbation Continue Trelegy 1 puff once a day to help prevent cough and wheeze.  Rinse mouth out after to help prevent  thrush. Continue Singulair 10 mg once a day to help prevent cough and wheeze May use albuterol 2 puffs every 4 hours as needed for coughing, wheezing, tightness in chest, or shortness of breath. Start prednisone- take 10 mg take one tablet twice a day for 4 days, then on the fifth day take 10 mg and then stop. Asthma control goals:   Full participation in all desired activities (may need albuterol before activity)  Albuterol use two time or less a week on average (not counting use with activity)  Cough interfering with sleep two time or less a month  Oral steroids no more than once a year  No hospitalizations Continue to consider the use of Xolair or Dupixent for allergic asthma control  Allergic rhinitis Continue Zyrtec 10 mg once a day as needed for runny nose or itching. Continue Flonase nasal spray 2 sprays each nostril once a day as needed for  nasal congestion. May use over-the-counter Pataday eyedrops 1 drop each eye once a day as needed for itchy watery eyes  Continue all other current medications. Please let us know if this treatment plan is not working well for you. Schedule follow-up in 3 months or sooner if needed  Thank you for the opportunity to care for this patient.  Please do not hesitate to contact me with questions.  Althea Charon, FNP [facility]  Addendum:  I performed a history and physical examination of the patient and discussed management with the resident. I reviewed the resident's note and agree with the documented findings and plan of care. The note in its entirety was edited by myself, including the physical exam, assessment, and plan.    I performed/discussed the history and physical examination of the patient as well as management with NP Ambs. I reviewed the NP's note and agree with the documented findings and plan of care with following additions/exceptions: none.  Will continue to discuss biologic asthma options in case she becomes interested in this for improved asthma control.    Prudy Feeler, MD [facility]

## 2019-08-03 ENCOUNTER — Ambulatory Visit: Payer: 59 | Admitting: Allergy

## 2019-09-04 ENCOUNTER — Other Ambulatory Visit: Payer: Self-pay

## 2019-09-04 ENCOUNTER — Emergency Department (HOSPITAL_COMMUNITY): Payer: 59

## 2019-09-04 ENCOUNTER — Emergency Department (HOSPITAL_COMMUNITY)
Admission: EM | Admit: 2019-09-04 | Discharge: 2019-09-04 | Disposition: A | Discharge status: Home / Self Care | Payer: 59 | Attending: Emergency Medicine | Admitting: Emergency Medicine

## 2019-09-04 DIAGNOSIS — N201 Calculus of ureter: Secondary | ICD-10-CM | POA: Diagnosis not present

## 2019-09-04 DIAGNOSIS — Z79899 Other long term (current) drug therapy: Secondary | ICD-10-CM | POA: Diagnosis not present

## 2019-09-04 DIAGNOSIS — R109 Unspecified abdominal pain: Secondary | ICD-10-CM | POA: Diagnosis present

## 2019-09-04 DIAGNOSIS — J45909 Unspecified asthma, uncomplicated: Secondary | ICD-10-CM | POA: Insufficient documentation

## 2019-09-04 LAB — URINALYSIS, ROUTINE W REFLEX MICROSCOPIC
Bilirubin Urine: NEGATIVE
Glucose, UA: NEGATIVE mg/dL
Ketones, ur: 5 mg/dL — AB
Leukocytes,Ua: NEGATIVE
Nitrite: NEGATIVE
Protein, ur: 100 mg/dL — AB
RBC / HPF: >50 RBC/hpf — ABNORMAL HIGH (ref 0–5)
Specific Gravity, Urine: 1.024 (ref 1.005–1.030)
pH: 5 (ref 5.0–8.0)

## 2019-09-04 LAB — CBC WITH DIFFERENTIAL/PLATELET
Abs Immature Granulocytes: 0.02 10*3/uL (ref 0.00–0.07)
Basophils Absolute: 0.1 10*3/uL (ref 0.0–0.1)
Basophils Relative: 1 %
Eosinophils Absolute: 0.1 10*3/uL (ref 0.0–0.5)
Eosinophils Relative: 1 %
HCT: 38.1 % (ref 36.0–46.0)
Hemoglobin: 12.8 g/dL (ref 12.0–15.0)
Immature Granulocytes: 0 %
Lymphocytes Relative: 20 %
Lymphs Abs: 1.9 10*3/uL (ref 0.7–4.0)
MCH: 32.7 pg (ref 26.0–34.0)
MCHC: 33.6 g/dL (ref 30.0–36.0)
MCV: 97.2 fL (ref 80.0–100.0)
Monocytes Absolute: 0.7 10*3/uL (ref 0.1–1.0)
Monocytes Relative: 7 %
Neutro Abs: 7.1 10*3/uL (ref 1.7–7.7)
Neutrophils Relative %: 71 %
Platelets: 338 10*3/uL (ref 150–400)
RBC: 3.92 MIL/uL (ref 3.87–5.11)
RDW: 11.5 % (ref 11.5–15.5)
WBC: 10 10*3/uL (ref 4.0–10.5)
nRBC: 0 % (ref 0.0–0.2)

## 2019-09-04 LAB — COMPREHENSIVE METABOLIC PANEL
ALT: 24 U/L (ref 0–44)
AST: 23 U/L (ref 15–41)
Albumin: 4.1 g/dL (ref 3.5–5.0)
Alkaline Phosphatase: 73 U/L (ref 38–126)
Anion gap: 12 (ref 5–15)
BUN: 20 mg/dL (ref 6–20)
CO2: 24 mmol/L (ref 22–32)
Calcium: 8.8 mg/dL — ABNORMAL LOW (ref 8.9–10.3)
Chloride: 102 mmol/L (ref 98–111)
Creatinine, Ser: 0.8 mg/dL (ref 0.44–1.00)
GFR calc Af Amer: >60 mL/min (ref >60)
GFR calc non Af Amer: >60 mL/min (ref >60)
Glucose, Bld: 91 mg/dL (ref 70–99)
Potassium: 3.2 mmol/L — ABNORMAL LOW (ref 3.5–5.1)
Sodium: 138 mmol/L (ref 135–145)
Total Bilirubin: 0.5 mg/dL (ref 0.3–1.2)
Total Protein: 7.1 g/dL (ref 6.5–8.1)

## 2019-09-04 LAB — LIPASE, BLOOD: Lipase: 33 U/L (ref 11–51)

## 2019-09-04 LAB — I-STAT BETA HCG BLOOD, ED (MC, WL, AP ONLY): I-stat hCG, quantitative: <5 m[IU]/mL (ref <5)

## 2019-09-04 LAB — PREGNANCY, URINE: Preg Test, Ur: NEGATIVE

## 2019-09-04 MED ORDER — OXYCODONE-ACETAMINOPHEN 5-325 MG PO TABS
1.0000 | ORAL_TABLET | Freq: Once | ORAL | Status: AC
  Administered 2019-09-04: 1 via ORAL
  Filled 2019-09-04: qty 1

## 2019-09-04 MED ORDER — OXYCODONE-ACETAMINOPHEN 5-325 MG PO TABS: 1.0000 | ORAL_TABLET | Freq: Four times a day (QID) | ORAL | 0 refills | Status: DC | PRN

## 2019-09-04 MED ORDER — HYDROMORPHONE HCL 1 MG/ML IJ SOLN
0.5000 mg | Freq: Once | INTRAMUSCULAR | Status: AC
  Administered 2019-09-04: 0.5 mg via INTRAVENOUS
  Filled 2019-09-04: qty 1

## 2019-09-04 MED ORDER — FENTANYL CITRATE (PF) 100 MCG/2ML IJ SOLN
50.0000 ug | Freq: Once | INTRAMUSCULAR | Status: DC
  Filled 2019-09-04: qty 2

## 2019-09-04 MED ORDER — CEPHALEXIN 500 MG PO CAPS
500.0000 mg | ORAL_CAPSULE | Freq: Once | ORAL | Status: AC
  Administered 2019-09-04: 500 mg via ORAL
  Filled 2019-09-04: qty 1

## 2019-09-04 MED ORDER — CEPHALEXIN 500 MG PO CAPS: 500.0000 mg | ORAL_CAPSULE | Freq: Two times a day (BID) | ORAL | 0 refills | Status: AC

## 2019-09-04 MED ORDER — ONDANSETRON HCL 4 MG/2ML IJ SOLN
4.0000 mg | Freq: Once | INTRAMUSCULAR | Status: AC
  Administered 2019-09-04: 4 mg via INTRAVENOUS
  Filled 2019-09-04: qty 2

## 2019-09-04 MED ORDER — TAMSULOSIN HCL 0.4 MG PO CAPS: 0.4000 mg | ORAL_CAPSULE | Freq: Every day | ORAL | 0 refills | Status: DC

## 2019-09-04 MED ORDER — ONDANSETRON 4 MG PO TBDP: 4.0000 mg | ORAL_TABLET | Freq: Three times a day (TID) | ORAL | 0 refills | Status: DC | PRN

## 2019-09-04 MED ORDER — POTASSIUM CHLORIDE CRYS ER 20 MEQ PO TBCR
40.0000 meq | EXTENDED_RELEASE_TABLET | Freq: Once | ORAL | Status: AC
  Administered 2019-09-04: 40 meq via ORAL
  Filled 2019-09-04: qty 2

## 2019-09-04 MED ORDER — TAMSULOSIN HCL 0.4 MG PO CAPS
0.4000 mg | ORAL_CAPSULE | Freq: Once | ORAL | Status: AC
  Administered 2019-09-04: 0.4 mg via ORAL
  Filled 2019-09-04: qty 1

## 2019-09-04 NOTE — ED Provider Notes (Signed)
[facility] DEPT Provider Note   CSN: [id number] Arrival date & time: 09/04/19  1500     History Chief Complaint  Patient presents with   Flank Pain   Urinary Retention    Jacki Couse is a 60 y.o. female history of kidney stones, chronic pain, asthma, anxiety, GERD, scoliosis.  Patient presents today for concern of kidney stone.  Patient reports 4:30 AM this morning she woke up with left flank pain.  Pain was severe nonradiating gradually resolved.  Pain returned again several hours later and has remained more constant she describes severe sharp pain nonradiating no clear aggravating or alleviating factors.  She reports this feels very similar to her prior kidney stone.  She reports associated nausea with one episode of nonbloody/nonbilious emesis.  Associated with hematuria.  Denies fever/chills, chest pain/shortness of breath, fall/injury, abdominal pain, dysuria, vaginal bleeding/discharge, numbness/weakness, tingling or any additional concerns.  HPI     Past Medical History:  Diagnosis Date   Allergy    Anxiety    Asthma    Chronic back pain    Depression    Eosinophilic esophagitis    Esophageal spasm    Esophageal stricture    Gastritis    GERD (gastroesophageal reflux disease)    Headache    occasional   IBS (irritable bowel syndrome)    Kidney stone 2015   gets every few years, hx/o calcium oxalate stones   Osteoporosis    Scoliosis    Seasonal allergic conjunctivitis    Seasonal allergic rhinitis    Small intestinal bacterial overgrowth    Vitamin D deficiency    Wears glasses     Patient Active Problem List   Diagnosis Date Noted   Paresthesia 65/68/1275   Other complicated headache syndrome 07/20/2019   Somnolence 07/20/2019   Osteoporosis 01/24/2019   Fatigue 01/04/2019   Attention and concentration deficit 01/04/2019   Family history of heart disease 08/28/2017   Anxiety 12/12/2016     Abdominal pain, epigastric 10/30/2016   Bloating 10/30/2016   Vaccine counseling 10/21/2016   Small intestinal bacterial overgrowth 04/24/2016   Rhinitis, allergic 11/20/2015   Microscopic hematuria 11/20/2015   History of renal calculi 11/20/2015   Routine general medical examination at a health care facility 10/16/2015   Gastroesophageal reflux disease without esophagitis 10/16/2015   IBS (irritable bowel syndrome) 10/16/2015   Idiopathic scoliosis 10/16/2015   Chronic back pain 10/16/2015   Asthma, mild persistent 10/16/2015   Vitamin D deficiency 10/16/2015    Past Surgical History:  Procedure Laterality Date   APPENDECTOMY     BREAST EXCISIONAL BIOPSY     left   COLONOSCOPY  2013   [place], [place]   COLONOSCOPY  08/2018   [facility], diverticula   ESOPHAGEAL DILATION     several times prior, [place], [place]   ESOPHAGOPLASTY  03/6999   complication of esophageal dilitation, hospitalization at [place] Right 12/31/2017   Procedure: EXCISION RIGHT WRIST DORSAL GANGLION;  Surgeon: Leanora Cover, MD;  Location: [facility];  Service: Orthopedics;  Laterality: Right;  Bier block   INCISION AND DRAINAGE ABSCESS Bilateral 09/14/2015   Procedure: INCISION AND DRAINAGE ABSCESS;  Surgeon: Leanora Cover, MD;  Location: [facility];  Service: Orthopedics;  Laterality: Bilateral;   KIDNEY STONE SURGERY  1986   LITHOTRIPSY     4x prior as of 05/2014     OB History   No obstetric history on  file.     Family History  Problem Relation Age of Onset   Barrett's esophagus Mother    Diabetes Father    Kidney Stones Father    Hypertension Father    Heart attack Father    Heart disease Father    Diabetes Sister    Kidney Stones Brother    Migraines Brother    Heart disease Maternal Grandfather    Barrett's esophagus Paternal Grandfather    Cancer Neg Hx    Stroke Neg Hx    Colon cancer Neg Hx     Esophageal cancer Neg Hx    Stomach cancer Neg Hx    Rectal cancer Neg Hx     Social History   Tobacco Use   Smoking status: Never Smoker   Smokeless tobacco: Never Used  Vaping Use   Vaping Use: Never used  Substance Use Topics   Alcohol use: Yes    Alcohol/week: 7.0 standard drinks    Types: 7 Glasses of wine per week   Drug use: No    Home Medications Prior to Admission medications   Medication Sig Start Date End Date Taking? Authorizing Provider  albuterol (VENTOLIN HFA) 108 (90 Base) MCG/ACT inhaler Inhale 2 puffs into the lungs every 6 (six) hours as needed for wheezing or shortness of breath. 09/08/18  Yes Padgett, Rae Halsted, MD  calcium carbonate (CALCIUM 600) 600 MG TABS tablet Take 1 tablet (600 mg total) by mouth 2 (two) times daily with a meal. Patient taking differently: Take 600 mg by mouth daily with breakfast.  01/24/19  Yes Tysinger, Camelia Eng, PA-C  cholecalciferol (VITAMIN D3) 25 MCG (1000 UT) tablet Take 2 tablets (2,000 Units total) by mouth daily. 01/25/19  Yes Tysinger, Camelia Eng, PA-C  estradiol (VIVELLE-DOT) 0.025 MG/24HR Place 1 patch onto the skin 2 (two) times a week.  12/05/18  Yes [provider]  fluticasone (FLONASE) 50 MCG/ACT nasal spray Place 2 sprays into both nostrils daily. Patient taking differently: Place 2 sprays into both nostrils daily as needed for allergies.  09/08/18  Yes Padgett, Rae Halsted, MD  LORazepam (ATIVAN) 0.5 MG tablet Take 1 tablet (0.5 mg total) by mouth 2 (two) times daily as needed for anxiety. 01/06/19  Yes Tysinger, Camelia Eng, PA-C  methocarbamol (ROBAXIN) 500 MG tablet Take 1 tablet (500 mg total) by mouth 2 (two) times daily as needed (back pain). Patient taking differently: Take 500 mg by mouth every 6 (six) hours as needed for muscle spasms (back pain).  02/16/17  Yes Tysinger, Camelia Eng, PA-C  pantoprazole (PROTONIX) 40 MG tablet Take 1 tablet (40 mg total) by mouth daily. 01/25/19  Yes Tysinger, Camelia Eng, PA-C  predniSONE (DELTASONE) 10 MG tablet Take 10 mg by mouth 2 (two) times daily with a meal.   Yes [provider]  progesterone (PROMETRIUM) 200 MG capsule Take 200 mg by mouth daily.  08/23/19  Yes [provider]  cephALEXin (KEFLEX) 500 MG capsule Take 1 capsule (500 mg total) by mouth 2 (two) times daily for 5 days. 09/04/19 09/09/19  Nuala Alpha A, PA-C  diclofenac (VOLTAREN) 75 MG EC tablet Take 1 tablet (75 mg total) by mouth 2 (two) times daily. Patient not taking: Reported on 05/13/2019 01/24/19   Tysinger, Camelia Eng, PA-C  Fluticasone-Umeclidin-Vilant (TRELEGY ELLIPTA) 200-62.5-25 MCG/INH AEPB Inhale 1 puff into the lungs daily. Patient not taking: Reported on 09/04/2019 05/02/19   Kennith Gain, MD  ondansetron (ZOFRAN ODT) 4 MG disintegrating tablet Take  1 tablet (4 mg total) by mouth every 8 (eight) hours as needed for nausea or vomiting. 09/04/19   Nuala Alpha A, PA-C  oxyCODONE-acetaminophen (PERCOCET/ROXICET) 5-325 MG tablet Take 1-2 tablets by mouth every 6 (six) hours as needed for severe pain. 09/04/19   Nuala Alpha A, PA-C  Peak Flow Meter DEVI 1 each by Does not apply route daily. 07/05/18   Tysinger, Camelia Eng, PA-C  tamsulosin (FLOMAX) 0.4 MG CAPS capsule Take 1 capsule (0.4 mg total) by mouth daily after breakfast. 09/04/19   Deliah Boston, PA-C    Allergies    Patient has no known allergies.  Review of Systems   Review of Systems Ten systems are reviewed and are negative for acute change except as noted in the HPI  Physical Exam Updated Vital Signs BP 114/71    Pulse 66    Temp 98.4 F (36.9 C)    Resp 18    LMP 10/31/2016    SpO2 95%   Physical Exam Constitutional:      General: She is not in acute distress.    Appearance: Normal appearance. She is well-developed. She is not ill-appearing or diaphoretic.  HENT:     Head: Normocephalic and atraumatic.  Eyes:     General: Vision grossly intact. Gaze aligned appropriately.       Pupils: Pupils are equal, round, and reactive to light.  Neck:     Trachea: Trachea and phonation normal.  Pulmonary:     Effort: Pulmonary effort is normal. No respiratory distress.  Abdominal:     General: There is no distension.     Palpations: Abdomen is soft.     Tenderness: There is no abdominal tenderness. There is left CVA tenderness. There is no right CVA tenderness, guarding or rebound.  Musculoskeletal:        General: Normal range of motion.     Cervical back: Normal range of motion.  Skin:    General: Skin is warm and dry.  Neurological:     Mental Status: She is alert.     GCS: GCS eye subscore is 4. GCS verbal subscore is 5. GCS motor subscore is 6.     Comments: Speech is clear and goal oriented, follows commands Major Cranial nerves without deficit, no facial droop Moves extremities without ataxia, coordination intact  Psychiatric:        Behavior: Behavior normal.     ED Results / Procedures / Treatments   Labs (all labs ordered are listed, but only abnormal results are displayed) Labs Reviewed  URINALYSIS, ROUTINE W REFLEX MICROSCOPIC - Abnormal; Notable for the following components:      Result Value   Color, Urine AMBER (*)    APPearance CLOUDY (*)    Hgb urine dipstick LARGE (*)    Ketones, ur 5 (*)    Protein, ur 100 (*)    RBC / HPF >50 (*)    Bacteria, UA RARE (*)    All other components within normal limits  COMPREHENSIVE METABOLIC PANEL - Abnormal; Notable for the following components:   Potassium 3.2 (*)    Calcium 8.8 (*)    All other components within normal limits  URINE CULTURE  CBC WITH DIFFERENTIAL/PLATELET  LIPASE, BLOOD  PREGNANCY, URINE  I-STAT BETA HCG BLOOD, ED ([facility], [facility], [facility] ONLY)    EKG None  Radiology CT Renal Stone Study  Result Date: 09/04/2019 CLINICAL DATA:  Left-sided flank pain for several hours EXAM: CT ABDOMEN AND PELVIS WITHOUT  CONTRAST TECHNIQUE: Multidetector CT imaging of the abdomen and pelvis was  performed following the standard protocol without IV contrast. COMPARISON:  02/11/2017 FINDINGS: Lower chest: No acute abnormality. Hepatobiliary: No focal liver abnormality is seen. No gallstones, gallbladder wall thickening, or biliary dilatation. Pancreas: Unremarkable. No pancreatic ductal dilatation or surrounding inflammatory changes. Spleen: Normal in size without focal abnormality. Adrenals/Urinary Tract: Adrenal glands are within normal limits. The right kidney demonstrates nonobstructing renal calculi in the upper and lower poles. The ureter is within normal limits. The bladder is decompressed. Left kidney also demonstrates nonobstructing renal calculi. Mild fullness of the collecting system and ureter are seen which extends to the level of the ureterovesical junction. At the left UVJ, there is a 3-4 mm obstructing stone identified. Stomach/Bowel: Scattered diverticular change of the colon is noted without evidence of diverticulitis. Changes of prior appendectomy are noted. No small bowel or gastric abnormality is seen. Vascular/Lymphatic: No significant vascular findings are present. No enlarged abdominal or pelvic lymph nodes. Reproductive: No adnexal mass is noted. The uterus is retroverted in nature. Other: No abdominal wall hernia or abnormality. No abdominopelvic ascites. Musculoskeletal: Degenerative changes of lumbar spine are noted. Scoliosis concave to the left is seen and stable IMPRESSION: 3-4 mm stone at the left UVJ with obstructive change. Bilateral nonobstructing renal calculi. Diverticulosis without diverticulitis. Electronically Signed   By: Inez Catalina M.D.   On: 09/04/2019 16:07    Procedures Procedures (including critical care time)  Medications Ordered in ED Medications  potassium chloride SA (KLOR-CON) CR tablet 40 mEq (has no administration in time range)  oxyCODONE-acetaminophen (PERCOCET/ROXICET) 5-325 MG per tablet 1 tablet (has no administration in time range)    tamsulosin (FLOMAX) capsule 0.4 mg (has no administration in time range)  cephALEXin (KEFLEX) capsule 500 mg (has no administration in time range)  ondansetron (ZOFRAN) injection 4 mg (4 mg Intravenous Given 09/04/19 1647)  HYDROmorphone (DILAUDID) injection 0.5 mg (0.5 mg Intravenous Given 09/04/19 1657)    ED Course  I have reviewed the triage vital signs and the nursing notes.  Pertinent labs & imaging results that were available during my care of the patient were reviewed by me and considered in my medical decision making (see chart for details).    MDM Rules/Calculators/A&P                           Additional History Obtained: 1. Nursing notes from this visit. 2. Prior ED visit on February 11, 2017 reviewed.  Patient had diagnosis of flank pain, hematuria and ureteral stone diagnosed by CT scan. --- I ordered, reviewed and interpreted labs which include: CBC within normal limits, no leukocytosis to suggest infection and no evidence of anemia. Lipase within normal limits doubt pancreatitis. CMP shows mild hypokalemia likely secondary to decreased p.o. and vomiting.  No emergent electrolyte derangement evidence of acute kidney injury, emergent elevation of LFTs or anion gap.  Will replete potassium orally. I-STAT beta-hCG negative Urinalysis shows 21-50 WBCs, greater than 50 RBCs, rare bacteria, ketones and protein.  Likely secondary to her kidney stone today.  Patient denies any dysuria.  CT Renal Stone Study:  IMPRESSION:  3-4 mm stone at the left UVJ with obstructive change.    Bilateral nonobstructing renal calculi.    Diverticulosis without diverticulitis.  - 5:48 PM: Discussed case with urologist Dr. Diona Fanti and reviewed urinalysis above.  Advises that we start patient on Keflex and culture the urine.  Patient can follow-up  as outpatient in their office. - Patient reevaluated resting comfortably in bed.  She is requesting discharge.  Pain improved, she is drinking  water and ice chips without difficulty.  I offered patient a Toradol shot today but she refused.  Plan of care is discharged with Keflex, Flomax, Zofran, (six pills) Percocet and urology follow-up. PDMP reviewed, no recent narcotic prescriptions.  Most recent prescription was in February 2021 for Ativan.  Patient states understanding narcotic precautions and has no questions, her friend to drive home today.  At this time there does not appear to be any evidence of an acute emergency medical condition and the patient appears stable for discharge with appropriate outpatient follow up. Diagnosis was discussed with patient who verbalizes understanding of care plan and is agreeable to discharge. I have discussed return precautions with patient who verbalizes understanding. Patient encouraged to follow-up with their PCP. All questions answered.  Patient's case discussed with Dr. Wilson Singer who agrees with plan to discharge with follow-up.   Note: Portions of this report may have been transcribed using voice recognition software. Every effort was made to ensure accuracy; however, inadvertent computerized transcription errors may still be present. Final Clinical Impression(s) / ED Diagnoses Final diagnoses:  Ureteral stone    Rx / DC Orders ED Discharge Orders         Ordered    cephALEXin (KEFLEX) 500 MG capsule  2 times daily     Discontinue  Reprint     09/04/19 1807    ondansetron (ZOFRAN ODT) 4 MG disintegrating tablet  Every 8 hours PRN     Discontinue  Reprint     09/04/19 1807    oxyCODONE-acetaminophen (PERCOCET/ROXICET) 5-325 MG tablet  Every 6 hours PRN     Discontinue  Reprint     09/04/19 1807    tamsulosin (FLOMAX) 0.4 MG CAPS capsule  Daily after breakfast     Discontinue  Reprint     09/04/19 1807           Gari Crown 09/04/19 1810    Virgel Manifold, MD 09/04/19 2013

## 2019-09-04 NOTE — Discharge Instructions (Addendum)
At this time there does not appear to be the presence of an emergent medical condition, however there is always the potential for conditions to change. Please read and follow the below instructions.  Please return to the Emergency Department immediately for any new or worsening symptoms or if your symptoms do not improve in the next 2 days. Please be sure to follow up with your Primary Care Provider within one week regarding your visit today; please call their office to schedule an appointment even if you are feeling better for a follow-up visit. Please call the specialist at [facility] urology to schedule a follow-up appointment regarding her kidney stone. Please continue taking the medication Flomax as prescribed to help facilitate kidney stone passage. Please take the antibiotic Keflex as prescribed.  Please drink plenty of water and get plenty of rest. You may use the pain medication Percocet (Oxycodone/Acetaminophen) as prescribed for severe pain.  Percocet will make you drowsy so do not drive, drink alcohol, take other sedating medications or perform any dangerous activities such as driving after taking Percocet.  Percocet contains Tylenol (acetaminophen) so do not take any other medications containing Tylenol with Percocet. You were given pain medication in the ER today, you may not drive for the rest of the day please have your friend drive you home. You may use the medication Zofran as prescribed to help with nausea and vomiting.  Zofran will dissolve underneath your tongue so do not swallow it. Your CT scan showed multiple kidney stones present in your kidneys which may cause you pain in the future.  There were also scattered diverticula without evidence of infection.  There are degenerative changes of your spine and scoliosis.  Please discuss these incidental findings with your primary care provider at your follow-up visit. Your potassium level was low in the emergency department today, this  should improve with return to normal diet.  Please have your potassium level rechecked by your primary care doctor at your follow-up visit.  Get help right away if: You have a fever or chills. You get very bad pain. You get new pain in your belly (abdomen). You pass out (faint). You cannot pee. You vomit and cannot keep food or water down You have any new/concerning or worsening of symptoms  Please read the additional information packets attached to your discharge summary.  Do not take your medicine if  develop an itchy rash, swelling in your mouth or lips, or difficulty breathing; call 911 and seek immediate emergency medical attention if this occurs.  Note: Portions of this text may have been transcribed using voice recognition software. Every effort was made to ensure accuracy; however, inadvertent computerized transcription errors may still be present.

## 2019-09-04 NOTE — ED Triage Notes (Signed)
Pt has hx kidney stones and woke up with left flank pains. Reports that now can't urinate and pain now radiating to bladder.

## 2019-09-05 LAB — URINE CULTURE: Culture: NO GROWTH

## 2019-10-07 ENCOUNTER — Ambulatory Visit: Payer: 59 | Admitting: Allergy

## 2019-10-07 ENCOUNTER — Encounter: Payer: Self-pay | Admitting: Allergy

## 2019-10-07 ENCOUNTER — Other Ambulatory Visit: Payer: Self-pay

## 2019-10-07 VITALS — BP 104/72 | HR 60 | Resp 14

## 2019-10-07 DIAGNOSIS — J4540 Moderate persistent asthma, uncomplicated: Secondary | ICD-10-CM | POA: Diagnosis not present

## 2019-10-07 DIAGNOSIS — H1010 Acute atopic conjunctivitis, unspecified eye: Secondary | ICD-10-CM

## 2019-10-07 DIAGNOSIS — J309 Allergic rhinitis, unspecified: Secondary | ICD-10-CM | POA: Diagnosis not present

## 2019-10-07 MED ORDER — FLUTICASONE PROPIONATE HFA 220 MCG/ACT IN AERO: INHALATION_SPRAY | RESPIRATORY_TRACT | 5 refills | Status: DC

## 2019-10-07 NOTE — Patient Instructions (Addendum)
Asthma Continue Flovent 282mcg 2 puffs 1-2 times a day based on symptoms For colder months when more prone to flare would increase back to Trelegy for control Hold Singulair for now May use albuterol 2 puffs every 4 hours as needed for coughing, wheezing, tightness in chest, or shortness of breath.  Asthma control goals:   Full participation in all desired activities (may need albuterol before activity)  Albuterol use two time or less a week on average (not counting use with activity)  Cough interfering with sleep two time or less a month  Oral steroids no more than once a year  No hospitalizations Continue to consider the use of Xolair or Dupixent for allergic asthma control  Allergic rhinitis Continue Zyrtec 10 mg once a day as needed for runny nose or itching. Continue Flonase nasal spray 2 sprays each nostril once a day as needed for nasal congestion. May use over-the-counter Pataday eyedrops 1 drop each eye once a day as needed for itchy watery eyes  Continue all other current medications. Please let us know if this treatment plan is not working well for you. Schedule follow-up in 3-4 months or sooner if needed

## 2019-10-07 NOTE — Progress Notes (Signed)
[facility] [place] [address] Dept: [phone]  FOLLOW UP NOTE  Patient ID: [name], female    DOB: 1960-09-11  Age: 59 y.o. MRN: [id number] Date of Office Visit: 10/07/2019  Assessment  Chief Complaint: Asthma  HPI [name] is a 59 year old female who presents for follow-up today of moderate persistent asthma and allergic rhinoconjunctivitis.  She was last seen on July 22, 2019 by myself and Dr. Nelva Bush.  Moderate persistent asthma is reported as well controlled with Flovent 220-using 2 puffs once a day to twice a day.  She reports that around May her breathing started feeling really well and she decided to stop the Trelegy and Singulair.  Since that time she has been on Flovent 220 mcg for the past 4 weeks or longer.  She denies any coughing, wheezing, tightness in her chest, shortness of breath or nocturnal awakenings.  She also denies any trips to the emergency room or urgent care due to to asthma and denies any systemic steroid use since her last office visit.  She did report that she had to go to the emergency room for kidney stones though about 3 weeks ago.  She is using her albuterol sometimes once every 7 to 10 days or even longer.  She reports that her worst months for her asthma are in May and November.  Allergic rhinoconjunctivitis is reported as moderately controlled with Flonase nasal spray as needed and Pataday eyedrops as needed.  She reports occasional nasal congestion in the morning at times for which Flonase nasal spray will help.  Also, she reports occasional sneezing.  She denies any rhinorrhea or postnasal drip.  Current medications are as listed in the chart.   Drug Allergies:  No Known Allergies  Review of Systems: Review of Systems  Constitutional: Negative for chills and fever.  HENT: Positive for congestion. Negative for nosebleeds and sore throat.   Eyes: Negative for blurred vision.  Respiratory: Negative for cough, shortness of  breath and wheezing.   Cardiovascular: Negative for chest pain and palpitations.  Gastrointestinal: Positive for heartburn. Negative for abdominal pain.  Genitourinary: Negative for dysuria.  Skin: Negative for itching and rash.  Neurological: Positive for headaches.  Endo/Heme/Allergies: Positive for environmental allergies.    Physical Exam: BP 104/72   Pulse 60   Resp 14   LMP 10/31/2016   SpO2 99%    Physical Exam Constitutional:      Appearance: Normal appearance.  HENT:     Head: Normocephalic and atraumatic.     Comments: Pharynx normal. Eyes normal. Ears normal. Nose normal    Right Ear: Tympanic membrane, ear canal and external ear normal.     Left Ear: Tympanic membrane, ear canal and external ear normal.     Nose: Nose normal.     Mouth/Throat:     Mouth: Mucous membranes are moist.     Pharynx: Oropharynx is clear.  Eyes:     Conjunctiva/sclera: Conjunctivae normal.  Cardiovascular:     Rate and Rhythm: Regular rhythm.     Heart sounds: Normal heart sounds.  Pulmonary:     Effort: Pulmonary effort is normal.     Breath sounds: Normal breath sounds.     Comments: Lungs clear to auscultation Musculoskeletal:     Cervical back: Neck supple.  Skin:    General: Skin is warm.  Neurological:     Mental Status: She is alert and oriented to person, place, and time.  Psychiatric:  Mood and Affect: Mood normal.        Behavior: Behavior normal.        Thought Content: Thought content normal.        Judgment: Judgment normal.     Diagnostics: FVC 2.80 L, FEV1 2.13 L.  Predicted FVC 3.12 L, FEV1 2.43 L.  Spirometry indicates normal ventilatory function.  Assessment and Plan:    Asthma Continue Flovent 237mcg 2 puffs 1-2 times a day based on symptoms For colder months when more prone to flare would increase back to Trelegy for control Hold Singulair for now May use albuterol 2 puffs every 4 hours as needed for coughing, wheezing, tightness in chest, or  shortness of breath.  Asthma control goals:   Full participation in all desired activities (may need albuterol before activity)  Albuterol use two time or less a week on average (not counting use with activity)  Cough interfering with sleep two time or less a month  Oral steroids no more than once a year  No hospitalizations Continue to consider the use of Xolair or Dupixent for allergic asthma control  Allergic rhinitis Continue Zyrtec 10 mg once a day as needed for runny nose or itching. Continue Flonase nasal spray 2 sprays each nostril once a day as needed for nasal congestion. May use over-the-counter Pataday eyedrops 1 drop each eye once a day as needed for itchy watery eyes  Continue all other current medications. Please let us know if this treatment plan is not working well for you. Schedule follow-up in 3-4 months or sooner if needed  Thank you for the opportunity to care for this patient.  Please do not hesitate to contact me with questions.  Althea Charon, FNP [facility] [place]:  I reviewed the Nurse Practitioner's note and agree with the documented findings and plan of care. We discussed the patient and developed a plan concurrently.   Prudy Feeler, MD [facility]

## 2019-11-02 ENCOUNTER — Other Ambulatory Visit: Payer: Self-pay | Admitting: Medical

## 2019-11-14 ENCOUNTER — Other Ambulatory Visit: Payer: Self-pay | Admitting: Allergy

## 2019-12-07 LAB — HM MAMMOGRAPHY

## 2019-12-08 LAB — RESULTS CONSOLE HPV: CHL HPV: NEGATIVE

## 2019-12-08 LAB — HM PAP SMEAR: HM Pap smear: NEGATIVE

## 2020-01-02 ENCOUNTER — Ambulatory Visit: Payer: 59 | Admitting: Internal Medicine

## 2020-01-02 ENCOUNTER — Encounter: Payer: Self-pay | Admitting: Internal Medicine

## 2020-01-02 VITALS — BP 116/70 | HR 70 | Ht 62.0 in | Wt 106.8 lb

## 2020-01-02 DIAGNOSIS — K200 Eosinophilic esophagitis: Secondary | ICD-10-CM | POA: Diagnosis not present

## 2020-01-02 MED ORDER — AMBULATORY NON FORMULARY MEDICATION: 2 refills | Status: DC

## 2020-01-02 NOTE — Patient Instructions (Addendum)
We have sent the following medications to your pharmacy for you to pick up at your convenience: Budesonide slurry ( this was faxed to [facility])  You may [facility] [place] with an update.  Normal BMI (Body Mass Index- based on height and weight) is between 19 and 25. Your BMI today is Body mass index is 19.53 kg/m. Marland Kitchen Please consider follow up  regarding your BMI with your Primary Care Provider.  Due to recent changes in healthcare laws, you may see the results of your imaging and laboratory studies on MyChart before your provider has had a chance to review them.  We understand that in some cases there may be results that are confusing or concerning to you. Not all laboratory results come back in the same time frame and the provider may be waiting for multiple results in order to interpret others.  Please give [place] 48 hours in order for your provider to thoroughly review all the results before contacting the office for clarification of your results.    I appreciate the opportunity to care for you. Silvano Rusk, MD, [facility]

## 2020-01-02 NOTE — Progress Notes (Signed)
[name] 59 y.o. November 05, 1960 [id number]  Assessment & Plan:   Encounter Diagnosis  Name Primary?  . Eosinophilic esophagitis Yes   I will go ahead and give her a refill on her budesonide slurry that she takes intermittently.  Follow-up with [place] as needed otherwise. Meds ordered this encounter  Medications  . AMBULATORY NON FORMULARY MEDICATION    Sig: Budesonide slurry 2mg /32ml Sig: 5cc daily x 4 weeks    Dispense:  150 mL    Refill:  2      Subjective:   Chief Complaint: Eosinophilic esophagitis flare  HPI The patient is a 59 year old white woman with a history of eosinophilic esophagitis followed by Dr. Hilarie Fredrickson who presents with complaints of chest discomfort that she says is typical for her eosinophilic esophagitis flare.  She had relatively severe discomfort as she has from time to time not much in the way of dysphagia but again says it is classic for her EOE flares.  The patient does report she feels like there is spontaneous improvement of the symptoms but she would still like treatment.  She takes a daily PPI.  She is requesting a refill on budesonide slurry which she will take for 2 to 4 weeks intermittently when she has problems. No Known Allergies Current Meds  Medication Sig  . calcium carbonate (CALCIUM 600) 600 MG TABS tablet Take 1 tablet (600 mg total) by mouth 2 (two) times daily with a meal. (Patient taking differently: Take 600 mg by mouth daily with breakfast. )  . cholecalciferol (VITAMIN D3) 25 MCG (1000 UT) tablet Take 2 tablets (2,000 Units total) by mouth daily.  . diclofenac (VOLTAREN) 75 MG EC tablet Take 1 tablet (75 mg total) by mouth 2 (two) times daily. (Patient taking differently: Take 75 mg by mouth as needed. )  . estradiol (VIVELLE-DOT) 0.025 MG/24HR Place 1 patch onto the skin 2 (two) times a week.   . fluticasone (FLONASE) 50 MCG/ACT nasal spray Place 2 sprays into both nostrils daily. (Patient taking differently: Place 2 sprays into both  nostrils as needed for allergies. )  . fluticasone (FLOVENT HFA) 220 MCG/ACT inhaler Inhale two puffs one to two times daily to prevent cough or wheeze. Rinse, gargle, and spit after use. (Patient taking differently: as needed. Inhale two puffs one to two times daily to prevent cough or wheeze. Rinse, gargle, and spit after use.)  . LORazepam (ATIVAN) 0.5 MG tablet Take 1 tablet (0.5 mg total) by mouth 2 (two) times daily as needed for anxiety.  . methocarbamol (ROBAXIN) 500 MG tablet Take 1 tablet (500 mg total) by mouth 2 (two) times daily as needed (back pain). (Patient taking differently: Take 500 mg by mouth every 6 (six) hours as needed for muscle spasms (back pain). )  . pantoprazole (PROTONIX) 40 MG tablet TAKE 1 TABLET BY MOUTH EVERY DAY  . Peak Flow Meter DEVI 1 each by Does not apply route daily.  . progesterone (PROMETRIUM) 200 MG capsule Take 200 mg by mouth daily.   . VENTOLIN HFA 108 (90 Base) MCG/ACT inhaler TAKE 2 PUFFS BY MOUTH EVERY 6 HOURS AS NEEDED FOR WHEEZE OR SHORTNESS OF BREATH   Past Medical History:  Diagnosis Date  . Allergy   . Anxiety   . Asthma   . Chronic back pain   . Depression   . Eosinophilic esophagitis   . Esophageal spasm   . Esophageal stricture   . Gastritis   . GERD (gastroesophageal reflux disease)   .  Headache    occasional  . IBS (irritable bowel syndrome)   . Kidney stone 2015   gets every few years, hx/o calcium oxalate stones  . Osteoporosis   . Scoliosis   . Seasonal allergic conjunctivitis   . Seasonal allergic rhinitis   . Small intestinal bacterial overgrowth   . Vitamin D deficiency   . Wears glasses    Past Surgical History:  Procedure Laterality Date  . APPENDECTOMY    . BREAST EXCISIONAL BIOPSY     left  . COLONOSCOPY  2013   [place], [place]  . COLONOSCOPY  08/2018   TA, diverticula  . ESOPHAGEAL DILATION     several times prior, [place], [place]  . ESOPHAGOPLASTY  11/4494   complication of esophageal dilitation,  hospitalization at [place] Right 12/31/2017   Procedure: EXCISION RIGHT WRIST DORSAL GANGLION;  Surgeon: Leanora Cover, MD;  Location: [facility];  Service: Orthopedics;  Laterality: Right;  Bier block  . INCISION AND DRAINAGE ABSCESS Bilateral 09/14/2015   Procedure: INCISION AND DRAINAGE ABSCESS;  Surgeon: Leanora Cover, MD;  Location: [facility];  Service: Orthopedics;  Laterality: Bilateral;  . [place]  . LITHOTRIPSY     4x prior as of 05/2014   Social History   Social History Narrative   2 dogs, muts [place] and [place], exercise 3-4 days per week with walking, Yoga, weights, CPA.  Customer service manager at [employer]   family history includes Barrett's esophagus in her mother and paternal grandfather; Diabetes in her father and sister; Heart attack in her father; Heart disease in her father and maternal grandfather; Hypertension in her father; Kidney Stones in her brother and father; Migraines in her brother.   Review of Systems As above  Objective:   Physical Exam BP 116/70   Pulse 70   Ht 5\' 2"  (1.575 m)   Wt 106 lb 12.8 oz (48.4 kg)   LMP 10/31/2016   SpO2 99%   BMI 19.53 kg/m

## 2020-01-25 ENCOUNTER — Other Ambulatory Visit: Payer: Self-pay

## 2020-01-25 ENCOUNTER — Ambulatory Visit (INDEPENDENT_AMBULATORY_CARE_PROVIDER_SITE_OTHER): Payer: 59 | Admitting: Medical

## 2020-01-25 ENCOUNTER — Encounter: Payer: Self-pay | Admitting: Medical

## 2020-01-25 VITALS — BP 120/70 | HR 62 | Ht 62.0 in | Wt 105.6 lb

## 2020-01-25 DIAGNOSIS — Z8249 Family history of ischemic heart disease and other diseases of the circulatory system: Secondary | ICD-10-CM

## 2020-01-25 DIAGNOSIS — Z0000 Encounter for general adult medical examination without abnormal findings: Secondary | ICD-10-CM | POA: Diagnosis not present

## 2020-01-25 DIAGNOSIS — R5383 Other fatigue: Secondary | ICD-10-CM

## 2020-01-25 DIAGNOSIS — R140 Abdominal distension (gaseous): Secondary | ICD-10-CM

## 2020-01-25 DIAGNOSIS — Z2820 Immunization not carried out because of patient decision for unspecified reason: Secondary | ICD-10-CM

## 2020-01-25 DIAGNOSIS — F419 Anxiety disorder, unspecified: Secondary | ICD-10-CM

## 2020-01-25 DIAGNOSIS — R41840 Attention and concentration deficit: Secondary | ICD-10-CM | POA: Diagnosis not present

## 2020-01-25 DIAGNOSIS — M810 Age-related osteoporosis without current pathological fracture: Secondary | ICD-10-CM

## 2020-01-25 DIAGNOSIS — Z681 Body mass index (BMI) 19 or less, adult: Secondary | ICD-10-CM

## 2020-01-25 DIAGNOSIS — K219 Gastro-esophageal reflux disease without esophagitis: Secondary | ICD-10-CM

## 2020-01-25 DIAGNOSIS — Z87442 Personal history of urinary calculi: Secondary | ICD-10-CM

## 2020-01-25 DIAGNOSIS — Z7185 Encounter for immunization safety counseling: Secondary | ICD-10-CM

## 2020-01-25 DIAGNOSIS — M4125 Other idiopathic scoliosis, thoracolumbar region: Secondary | ICD-10-CM

## 2020-01-25 DIAGNOSIS — E559 Vitamin D deficiency, unspecified: Secondary | ICD-10-CM | POA: Diagnosis not present

## 2020-01-25 DIAGNOSIS — J301 Allergic rhinitis due to pollen: Secondary | ICD-10-CM

## 2020-01-25 DIAGNOSIS — R3129 Other microscopic hematuria: Secondary | ICD-10-CM

## 2020-01-25 DIAGNOSIS — M549 Dorsalgia, unspecified: Secondary | ICD-10-CM

## 2020-01-25 DIAGNOSIS — K589 Irritable bowel syndrome without diarrhea: Secondary | ICD-10-CM

## 2020-01-25 DIAGNOSIS — J4530 Mild persistent asthma, uncomplicated: Secondary | ICD-10-CM

## 2020-01-25 DIAGNOSIS — G8929 Other chronic pain: Secondary | ICD-10-CM

## 2020-01-25 DIAGNOSIS — Z13220 Encounter for screening for lipoid disorders: Secondary | ICD-10-CM

## 2020-01-25 LAB — LIPID PANEL
Chol/HDL Ratio: 2.4 ratio (ref 0.0–4.4)
Cholesterol, Total: 188 mg/dL (ref 100–199)
HDL: 78 mg/dL (ref >39)
LDL Chol Calc (NIH): 96 mg/dL (ref 0–99)
Triglycerides: 74 mg/dL (ref 0–149)
VLDL Cholesterol Cal: 14 mg/dL (ref 5–40)

## 2020-01-25 NOTE — Progress Notes (Signed)
Subjective:   HPI  [name] is a 59 y.o. female who presents for Chief Complaint  Patient presents with  . Annual Exam    with fasting labs-needs refill on Lorazepam     Patient Care Team: Naome Brigandi, Camelia Eng, PA-C as PCP - General (Family Medicine) Sees dentist:Dr. Max Fickle eye doctor: Melanee Spry Gynecology, [facility], Dr. Alfred Levins GI: Dr. Zenovia Jarred and Dr. Silvano Rusk Urology, Dr. Festus Aloe Allergy/Asthma - Dr. Prudy Feeler Dermatology, Dr. Donny Pique, Dr. Leanora Cover   Concerns: Patient has no concerns today.   She notes having bone density 2020 that had worsened to osteoporosis.   Gyn advised ca+Vid D and recheck in 2 years.   She notes lump in right upper thigh, unchanged for probably a year or more.  Her brother gets cyst.  No recent major concerns with her chronic issues  She does stay bloated all the time.  She has seen GI about this.  She has tried various treatments in the past.  She has tried probiotics.  Reviewed their medical, surgical, family, social, medication, and allergy history and updated chart as appropriate.  Past Medical History:  Diagnosis Date  . Allergy   . Anxiety   . Asthma   . Chronic back pain   . Depression   . Eosinophilic esophagitis   . Esophageal spasm   . Esophageal stricture   . Gastritis   . GERD (gastroesophageal reflux disease)   . Headache    occasional  . IBS (irritable bowel syndrome)   . Kidney stone 2015   gets every few years, hx/o calcium oxalate stones  . Osteoporosis   . Scoliosis   . Seasonal allergic conjunctivitis   . Seasonal allergic rhinitis   . Small intestinal bacterial overgrowth   . Vitamin D deficiency   . Wears glasses     Past Surgical History:  Procedure Laterality Date  . APPENDECTOMY    . BREAST EXCISIONAL BIOPSY     left  . COLONOSCOPY  2013   [place], [place]  . COLONOSCOPY  08/2018   TA, diverticula  . ESOPHAGEAL DILATION     several times  prior, [place], [place]  . ESOPHAGOPLASTY  10/8414   complication of esophageal dilitation, hospitalization at [place] Right 12/31/2017   Procedure: EXCISION RIGHT WRIST DORSAL GANGLION;  Surgeon: Leanora Cover, MD;  Location: [facility];  Service: Orthopedics;  Laterality: Right;  Bier block  . INCISION AND DRAINAGE ABSCESS Bilateral 09/14/2015   Procedure: INCISION AND DRAINAGE ABSCESS;  Surgeon: Leanora Cover, MD;  Location: [facility];  Service: Orthopedics;  Laterality: Bilateral;  . [place]  . LITHOTRIPSY     4x prior as of 05/2014      Family History  Problem Relation Age of Onset  . Barrett's esophagus Mother   . Diabetes Father   . Kidney Stones Father   . Hypertension Father   . Heart attack Father   . Heart disease Father 60       MI , stent  . Diabetes Sister   . Kidney Stones Brother   . Migraines Brother   . Urolithiasis Brother   . Diabetes Brother   . Headache Brother   . Heart disease Maternal Grandfather        CABG  . Barrett's esophagus Paternal Grandfather   . Heart disease Maternal Grandmother 66       rheumatic  related heart disesae  . Cancer Neg Hx   . Stroke Neg Hx   . Colon cancer Neg Hx   . Esophageal cancer Neg Hx   . Stomach cancer Neg Hx   . Rectal cancer Neg Hx      Current Outpatient Medications:  .  AMBULATORY NON FORMULARY MEDICATION, Budesonide slurry 2mg /53ml Sig: 5cc daily x 4 weeks, Disp: 150 mL, Rfl: 2 .  BREO ELLIPTA 200-25 MCG/INH AEPB, Inhale 1 puff into the lungs daily., Disp: , Rfl:  .  calcium carbonate (CALCIUM 600) 600 MG TABS tablet, Take 1 tablet (600 mg total) by mouth 2 (two) times daily with a meal. (Patient taking differently: Take 600 mg by mouth daily with breakfast. ), Disp: 180 tablet, Rfl: 3 .  cholecalciferol (VITAMIN D3) 25 MCG (1000 UT) tablet, Take 2 tablets (2,000 Units total) by mouth daily., Disp: 180 tablet, Rfl: 3 .  diclofenac  (VOLTAREN) 75 MG EC tablet, Take 1 tablet (75 mg total) by mouth 2 (two) times daily. (Patient taking differently: Take 75 mg by mouth as needed. ), Disp: 45 tablet, Rfl: 2 .  estradiol (VIVELLE-DOT) 0.025 MG/24HR, Place 1 patch onto the skin 2 (two) times a week. , Disp: , Rfl:  .  fluticasone (FLONASE) 50 MCG/ACT nasal spray, Place 2 sprays into both nostrils daily. (Patient taking differently: Place 2 sprays into both nostrils as needed for allergies. ), Disp: 16 g, Rfl: 5 .  LORazepam (ATIVAN) 0.5 MG tablet, Take 1 tablet (0.5 mg total) by mouth 2 (two) times daily as needed for anxiety., Disp: 60 tablet, Rfl: 1 .  methocarbamol (ROBAXIN) 500 MG tablet, Take 1 tablet (500 mg total) by mouth 2 (two) times daily as needed (back pain). (Patient taking differently: Take 500 mg by mouth every 6 (six) hours as needed for muscle spasms (back pain). ), Disp: 45 tablet, Rfl: 2 .  pantoprazole (PROTONIX) 40 MG tablet, TAKE 1 TABLET BY MOUTH EVERY DAY, Disp: 90 tablet, Rfl: 1 .  progesterone (PROMETRIUM) 200 MG capsule, Take 200 mg by mouth daily. , Disp: , Rfl:  .  VENTOLIN HFA 108 (90 Base) MCG/ACT inhaler, TAKE 2 PUFFS BY MOUTH EVERY 6 HOURS AS NEEDED FOR WHEEZE OR SHORTNESS OF BREATH, Disp: 18 g, Rfl: 1 .  Peak Flow Meter DEVI, 1 each by Does not apply route daily. (Patient not taking: Reported on 01/25/2020), Disp: 1 each, Rfl: 1  No Known Allergies   Review of Systems Constitutional: -fever, -chills, -sweats, -unexpected weight change, -decreased appetite, -fatigue Allergy: -sneezing, -itching, -congestion Dermatology: -changing moles, --rash, -lumps ENT: -runny nose, -ear pain, -sore throat, -hoarseness, -sinus pain, -teeth pain, - ringing in ears, -hearing loss, -nosebleeds Cardiology: -chest pain, -palpitations, -swelling, -difficulty breathing when lying flat, -waking up short of breath Respiratory: -cough, -shortness of breath, -difficulty breathing with exercise or exertion, -wheezing,  -coughing up blood Gastroenterology: -abdominal pain, -nausea, -vomiting, -diarrhea, -constipation, -blood in stool, -changes in bowel movement, -difficulty swallowing or eating Hematology: -bleeding, -bruising  Musculoskeletal: -joint aches, -muscle aches, -joint swelling, +back pain, -neck pain, -cramping, -changes in gait Ophthalmology: denies vision changes, eye redness, itching, discharge Urology: -burning with urination, -difficulty urinating, -blood in urine, -urinary frequency, -urgency, -incontinence Neurology: -headache, -weakness, -tingling, -numbness, -memory loss, -falls, -dizziness Psychology: -depressed mood, -agitation, -sleep problems Breast/gyn: -breast tendnerss, -discharge, -lumps, -vaginal discharge,- irregular periods, -heavy periods     Objective:  BP 120/70   Pulse 62   Ht 5\' 2"  (1.575 m)   Wt  105 lb 9.6 oz (47.9 kg)   LMP 10/31/2016   SpO2 95%   BMI 19.31 kg/m    Wt Readings from Last 3 Encounters:  01/25/20 105 lb 9.6 oz (47.9 kg)  01/02/20 106 lb 12.8 oz (48.4 kg)  07/22/19 108 lb 3.2 oz (49.1 kg)     General appearence: alert, no distress, WD/WN, lean white female Skin: Scattered macules, right upper thigh anteriorly, proximal third with a small subcutaneous 2.5cm oval lump suggestive of cyst, nontender, no fluctuance erythema or other abnormality, otherwise scattered macules, no worrisome lesions  HEENT: normocephalic, sclerae anicteric, PERRLA, EOMi, nares patent, no discharge or erythema, pharynx normal Oral cavity: MMM, no lesions Neck: supple, no lymphadenopathy, no thyromegaly, no masses Heart: RRR, normal S1, S2, no murmurs Lungs: CTA bilaterally, no wheezes, rhonchi, or rales Abdomen: +bs, soft, non tender, non distended, no masses, no hepatomegaly, no splenomegaly Back: non tender Musculoskeletal: nontender, no swelling, no obvious deformity Extremities: no edema, no cyanosis, no clubbing Pulses: 2+ symmetric, upper and lower extremities,  normal cap refill Neurological: alert, oriented x 3, CN2-12 intact, strength normal upper extremities and lower extremities, sensation normal throughout, DTRs 2+ throughout, no cerebellar signs, gait normal Psychiatric: normal affect, behavior normal, pleasant  Breast, GYN, rectal-deferred to gynecology    Assessment and Plan :   Encounter Diagnoses  Name Primary?  . Routine general medical examination at a health care facility Yes  . Vitamin D deficiency   . Vaccine counseling   . Attention and concentration deficit   . Anxiety   . Osteoporosis, unspecified osteoporosis type, unspecified pathological fracture presence   . Mild persistent asthma without complication   . Allergic rhinitis due to pollen, unspecified seasonality   . Irritable bowel syndrome, unspecified type   . Gastroesophageal reflux disease without esophagitis   . Microscopic hematuria   . Chronic back pain, unspecified back location, unspecified back pain laterality   . Family history of heart disease   . History of renal calculi   . Fatigue, unspecified type   . Other idiopathic scoliosis, thoracolumbar region   . Screening for lipid disorders   . Vaccine refused by patient   . Bloating     Physical exam - discussed and counseled on healthy lifestyle, diet, exercise, preventative care, vaccinations, sick and well care, proper use of emergency dept and after hours care, and addressed their concerns.    Health screening: Advised they see their eye doctor yearly for routine vision care. Advised they see their dentist yearly for routine dental care including hygiene visits twice yearly. See your gynecologist yearly for routine gynecological care.  Cancer screening Counseled on self breast exams, mammograms, cervical cancer screening  Colonoscopy:  Reviewed colonoscopy on file that is up to date 08/2018  We will request 2020 pap, mammo and bone density from [facility]  Discussed routine skin  surveillance   Vaccinations: Advised yearly influenza vaccine, covid, pneumococcal 23 and shingrix.  She will consider but declines today     Separate significant issues discussed: Asthma and allergies-continue current medication, managed by specialist.  Reviewed recent pulmonary function test in chart record.  Vitamin D deficiency-continue supplement, counseled on diet, sun exposure  Anxiety, concentration deficit-use lorazepam as needed.  GERD-discussed risk and benefits of medicine, continue Protonix  Microscopic hematuria, history of kidney stone-reviewed March 2021 urology visit with Dr. Junious Silk. Stable, no recent hematuria, no other work-up at this time, urology plan to recheck in 1 year including [facility] x-ray  Osteoporosis-advised  weightbearing exercise, aerobic exercise, continue vitamin D and calcium, on hormone therapy per gynecology.  We will request recent bone density test from gynecology  We discussed her BMI.  This is fairly stable.  We discussed the relationship of this to osteoporosis.  Counseled on diet, exercise including weightbearing exercise.  IBS-no recent concerns.  Sees gastroenterology  Chronic back pain-advise regular exercise, cardiac and weightbearing.  Continue Robaxin as needed, Voltaren as needed , not daily  Bloating-counseled on avoiding food triggers, consider Kombucha, avoid fried foods.  She had several labs done back in June which were reviewed  Lynnie was seen today for annual exam.  Diagnoses and all orders for this visit:  Routine general medical examination at a health care facility -     Lipid panel  Vitamin D deficiency  Vaccine counseling  Attention and concentration deficit  Anxiety  Osteoporosis, unspecified osteoporosis type, unspecified pathological fracture presence  Mild persistent asthma without complication  Allergic rhinitis due to pollen, unspecified seasonality  Irritable bowel syndrome, unspecified  type  Gastroesophageal reflux disease without esophagitis  Microscopic hematuria  Chronic back pain, unspecified back location, unspecified back pain laterality  Family history of heart disease  History of renal calculi  Fatigue, unspecified type  Other idiopathic scoliosis, thoracolumbar region  Screening for lipid disorders -     Lipid panel  Vaccine refused by patient  Bloating    Follow-up pending labs, yearly for physical

## 2020-01-26 ENCOUNTER — Other Ambulatory Visit: Payer: Self-pay | Admitting: Medical

## 2020-01-26 MED ORDER — LORAZEPAM 0.5 MG PO TABS: 0.5000 mg | ORAL_TABLET | Freq: Two times a day (BID) | ORAL | 1 refills | Status: DC | PRN

## 2020-02-02 ENCOUNTER — Telehealth: Payer: Self-pay | Admitting: Medical

## 2020-02-02 NOTE — Telephone Encounter (Signed)
Requested records received from [facility]

## 2020-03-27 ENCOUNTER — Encounter: Payer: Self-pay | Admitting: Medical

## 2020-04-22 ENCOUNTER — Other Ambulatory Visit: Payer: Self-pay | Admitting: Medical

## 2020-08-23 ENCOUNTER — Ambulatory Visit (INDEPENDENT_AMBULATORY_CARE_PROVIDER_SITE_OTHER): Payer: 59 | Admitting: Physician Assistant

## 2020-08-23 ENCOUNTER — Encounter: Payer: Self-pay | Admitting: Physician Assistant

## 2020-08-23 VITALS — BP 112/60 | HR 63 | Ht 62.0 in | Wt 106.0 lb

## 2020-08-23 DIAGNOSIS — R079 Chest pain, unspecified: Secondary | ICD-10-CM | POA: Diagnosis not present

## 2020-08-23 DIAGNOSIS — K200 Eosinophilic esophagitis: Secondary | ICD-10-CM | POA: Diagnosis not present

## 2020-08-23 DIAGNOSIS — K219 Gastro-esophageal reflux disease without esophagitis: Secondary | ICD-10-CM | POA: Diagnosis not present

## 2020-08-23 MED ORDER — PANTOPRAZOLE SODIUM 40 MG PO TBEC: 1.0000 | DELAYED_RELEASE_TABLET | Freq: Every day | ORAL | 0 refills | Status: DC

## 2020-08-23 MED ORDER — AMBULATORY NON FORMULARY MEDICATION: 2 refills | Status: DC

## 2020-08-23 NOTE — Progress Notes (Signed)
Subjective:    Patient ID: [name], female    DOB: 1961-03-19, 60 y.o.   MRN: [id number]  HPI [name] is a pleasant 60 year old white female, established with Dr. Hilarie Fredrickson who comes in today with complaints of chest discomfort, mild dysphagia and odynophagia with exacerbation of symptoms over the past few weeks. She has history of GERD, IBS, SIBO, asthma and has prior diagnosis of eosinophilic esophagitis.  She was last seen in October 2021 by Dr. Carlean Purl and at that time had budesonide slurry refilled to use on an as needed basis for periodic flareups. She last had EGD in September 2018 which was normal.  Biopsy showed no evidence of eosinophilic esophagitis, gastric biopsies with mild chronic gastritis no H. pylori and small bowel biopsies were negative. Prior to that she had undergone EGD in 2015 per Dr. Rolm Bookbinder in [facility] with biopsies consistent with eosinophilic esophagitis with abundant eosinophilic infiltrates greater than 100 per high-power field and eosinophilic microabscesses..  She was Savary dilated empirically for complaints of dysphagia and had a nonobstructive stricture.  She was also noted to have small white plaques in the esophagus.  Patient has been maintained on Protonix 40 mg p.o. every morning.  She says she had not taken any budesonide over the past several months and actually had forgotten that she could be treated with this. She says she has been hurting constantly in her chest and esophagus over the past couple of weeks, bit more uncomfortable with eating and says she can feel food traversing her esophagus.  She is having some dysphagia to pills which seem to want to hang up and are requiring a lot of liquid to push them down.  She has been having some episodes with food intermittently over the past couple weeks as well, none requiring regurgitation.  She says her symptoms have waxed and waned over the past few weeks but persist. No recent new medications, she did  take a very brief course of an antibiotic just 1 day prior to his cystoscopy.  She relates prior allergy testing and is only allergic to yellow squash as far she recalls.  Review of Systems Pertinent positive and negative review of systems were noted in the above HPI section.  All other review of systems was otherwise negative.  Outpatient Encounter Medications as of 08/23/2020  Medication Sig  . calcium carbonate (CALCIUM 600) 600 MG TABS tablet Take 1 tablet (600 mg total) by mouth 2 (two) times daily with a meal. (Patient taking differently: Take 600 mg by mouth daily with breakfast.)  . cholecalciferol (VITAMIN D3) 25 MCG (1000 UT) tablet Take 2 tablets (2,000 Units total) by mouth daily.  . diclofenac (VOLTAREN) 75 MG EC tablet Take 1 tablet (75 mg total) by mouth 2 (two) times daily. (Patient taking differently: Take 75 mg by mouth as needed.)  . estradiol (VIVELLE-DOT) 0.025 MG/24HR Place 1 patch onto the skin 2 (two) times a week.   . fluticasone (FLONASE) 50 MCG/ACT nasal spray Place 2 sprays into both nostrils daily.  Marland Kitchen LORazepam (ATIVAN) 0.5 MG tablet Take 1 tablet (0.5 mg total) by mouth 2 (two) times daily as needed for anxiety.  . methocarbamol (ROBAXIN) 500 MG tablet Take 1 tablet (500 mg total) by mouth 2 (two) times daily as needed (back pain). (Patient taking differently: Take 500 mg by mouth every 6 (six) hours as needed for muscle spasms (back pain).)  . Peak Flow Meter DEVI 1 each by Does not apply route daily.  Marland Kitchen  progesterone (PROMETRIUM) 200 MG capsule Take 200 mg by mouth daily.   . VENTOLIN HFA 108 (90 Base) MCG/ACT inhaler TAKE 2 PUFFS BY MOUTH EVERY 6 HOURS AS NEEDED FOR WHEEZE OR SHORTNESS OF BREATH  . [DISCONTINUED] AMBULATORY NON FORMULARY MEDICATION Budesonide slurry 2mg /75ml Sig: 5cc daily x 4 weeks  . [DISCONTINUED] pantoprazole (PROTONIX) 40 MG tablet TAKE 1 TABLET BY MOUTH EVERY DAY  . AMBULATORY NON FORMULARY MEDICATION Budesonide slurry 2mg /77ml Sig: 5cc daily x  8 weeks  . pantoprazole (PROTONIX) 40 MG tablet Take 1 tablet (40 mg total) by mouth daily.  . [DISCONTINUED] BREO ELLIPTA 200-25 MCG/INH AEPB Inhale 1 puff into the lungs daily. (Patient not taking: Reported on 08/23/2020)  . [DISCONTINUED] pantoprazole (PROTONIX) 40 MG tablet Take 1 tablet (40 mg total) by mouth daily.   No facility-administered encounter medications on file as of 08/23/2020.   No Known Allergies Patient Active Problem List   Diagnosis Date Noted  . Screening for lipid disorders 01/25/2020  . Vaccine refused by patient 01/25/2020  . Adult BMI <19 kg/sq m 01/25/2020  . Body mass index (BMI) of 19.0 to 19.9 in adult 01/25/2020  . Paresthesia 07/20/2019  . Other complicated headache syndrome 07/20/2019  . Osteoporosis 01/24/2019  . Fatigue 01/04/2019  . Attention and concentration deficit 01/04/2019  . Family history of heart disease 08/28/2017  . Anxiety 12/12/2016  . Bloating 10/30/2016  . Vaccine counseling 10/21/2016  . Small intestinal bacterial overgrowth 04/24/2016  . Rhinitis, allergic 11/20/2015  . Microscopic hematuria 11/20/2015  . History of renal calculi 11/20/2015  . Routine general medical examination at a health care facility 10/16/2015  . Gastroesophageal reflux disease without esophagitis 10/16/2015  . IBS (irritable bowel syndrome) 10/16/2015  . Idiopathic scoliosis 10/16/2015  . Chronic back pain 10/16/2015  . Asthma, mild persistent 10/16/2015  . Vitamin D deficiency 10/16/2015   Social History   Socioeconomic History  . Marital status: Single    Spouse name: Not on file  . Number of children: 0  . Years of education: Not on file  . Highest education level: Not on file  Occupational History  . Occupation: Tax adviser    Comment: Midwife  Tobacco Use  . Smoking status: Never Smoker  . Smokeless tobacco: Never Used  Vaping Use  . Vaping Use: Never used  Substance and Sexual Activity  . Alcohol use: Yes     Alcohol/week: 4.0 standard drinks    Types: 4 Glasses of wine per week  . Drug use: No  . Sexual activity: Not on file  Other Topics Concern  . Not on file  Social History Narrative   2 dogs, muts [place] and [place], exercise 3-4 days per week with walking, Yoga, weights, CPA.  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   01/2020   Social Determinants of Health   Financial Resource Strain: Not on file  Food Insecurity: Not on file  Transportation Needs: Not on file  Physical Activity: Not on file  Stress: Not on file  Social Connections: Not on file  Intimate Partner Violence: Not on file    Ms. Mannes's family history includes Barrett's esophagus in her mother and paternal grandfather; Diabetes in her brother, father, and sister; Headache in her brother; Heart attack in her father; Heart disease in her maternal grandfather; Heart disease (age of onset: 32) in her maternal grandmother; Heart disease (age of onset: 12) in her father; Hypertension in her father; Kidney Stones in her brother and father; Migraines  in her brother; Urolithiasis in her brother.      Objective:    Vitals:   08/23/20 1501  BP: 112/60  Pulse: 63  SpO2: 98%    Physical Exam Well-developed well-nourished older WFin no acute distress.  Height, [id number], BMI19  HEENT; nontraumatic normocephalic, EOMI, PE R LA, sclera anicteric. Oropharynx;benign, no thrush Neck; supple, no JVD Cardiovascular; regular rate and rhythm with S1-S2, no murmur rub or gallop Pulmonary; Clear bilaterally Abdomen; soft, nontender, nondistended, no palpable mass or hepatosplenomegaly, bowel sounds are active Rectal;not done Skin; benign exam, no jaundice rash or appreciable lesions Extremities; no clubbing cyanosis or edema skin warm and dry Neuro/Psych; alert and oriented x4, grossly nonfocal mood and affect appropriate       Assessment & Plan:   #67 60 year old white female with history of GERD and prior diagnosis of  eosinophilic esophagitis made in 2015.  She had EGD in 2018 here which interestingly was normal, with biopsy showing no evidence of eosinophilic esophagitis. Patient is maintained on chronic PPI therapy.  She comes in now with 3 to 4-week history of chest discomfort, and mild dysphagia and odynophagia.  Symptoms are consistent with exacerbation of eosinophilic esophagitis. Cannot rule out stricture  #2 colon cancer screening-up-to-date with last colonoscopy June 2020 with finding of 2 small tubular adenomas  #3 diverticulosis #4.  IBS #5.  Previous diagnosis of SIBO #6.  Asthma  Plan; increase Protonix to 40 mg p.o. twice daily x4 weeks then if symptoms improve decrease to 1 p.o. every morning AC breakfast Start budesonide slurry 2 mg / 10 mL-5 cc twice daily -usually would dose twice daily but she says if she takes a dose in the evening it will affect her sleep so has done once daily dosing in the past x8 weeks. Okay to take 5 cc once daily x8 weeks or try twice daily dosing with second dose at lunchtime x4 to 6 weeks. Advised patient she could repeat a course if symptoms recur in the near future. She is advised to call if she has not had any improvement in her symptoms over the next 2 to 3 weeks and/or if dysphagia symptoms are persisting then she may need repeat EGD and possible dilation with Dr. Hilarie Fredrickson.  Cindie Rajagopalan Genia Harold PA-C 08/23/2020   Cc: Carlena Hurl, PA-C

## 2020-08-23 NOTE — Patient Instructions (Addendum)
If you are age 59 or younger, your body mass index should be between 19-25. Your Body mass index is 19.39 kg/m. If this is out of the aformentioned range listed, please consider follow up with your Primary Care Provider.  __________________________________________________________  The [facility] providers would like to encourage you to use [facility] to communicate with providers for non-urgent requests or questions.  Due to long hold times on the telephone, sending your provider a message by [facility] may be a faster and more efficient way to get a response.  Please allow 48 business hours for a response.  Please remember that this is for non-urgent requests.   Increase your Pantoprazole 40 mg 1 tablet twice daily before meals for 4 weeks. A 30 day supply has been sent to your local pharmacy.  Refills of your Budesonide slurry have been sent to [facility]. You can repeat the course if needed.  Call back in 2-3 weeks, and ask for Amy's nurse, Beth if you have not experienced any improvement.  Thank you for entrusting me with your care and choosing [facility].  Amy Esterwood, PA-C

## 2020-08-23 NOTE — Progress Notes (Signed)
Addendum: Reviewed and agree with assessment and management plan. Joseff Luckman M, MD  

## 2020-12-10 LAB — HM DEXA SCAN

## 2020-12-10 LAB — HM MAMMOGRAPHY

## 2020-12-17 ENCOUNTER — Telehealth: Payer: Self-pay | Admitting: *Deleted

## 2020-12-17 ENCOUNTER — Encounter: Payer: Self-pay | Admitting: Family Medicine

## 2020-12-17 ENCOUNTER — Other Ambulatory Visit: Payer: Self-pay

## 2020-12-17 ENCOUNTER — Telehealth: Payer: 59 | Admitting: Family Medicine

## 2020-12-17 ENCOUNTER — Encounter: Payer: Self-pay | Admitting: *Deleted

## 2020-12-17 VITALS — Wt 105.0 lb

## 2020-12-17 DIAGNOSIS — U071 COVID-19: Secondary | ICD-10-CM | POA: Diagnosis not present

## 2020-12-17 DIAGNOSIS — J4530 Mild persistent asthma, uncomplicated: Secondary | ICD-10-CM | POA: Diagnosis not present

## 2020-12-17 MED ORDER — NIRMATRELVIR/RITONAVIR (PAXLOVID)TABLET: 3.0000 | ORAL_TABLET | Freq: Two times a day (BID) | ORAL | 0 refills | Status: DC

## 2020-12-17 MED ORDER — NIRMATRELVIR/RITONAVIR (PAXLOVID)TABLET: 3.0000 | ORAL_TABLET | Freq: Two times a day (BID) | ORAL | 0 refills | Status: AC

## 2020-12-17 NOTE — Progress Notes (Signed)
   Subjective:  Documentation for virtual audio and video telecommunications through [place] encounter:  The patient was located at home. 2 patient identifiers used.  The provider was located in the office. The patient did consent to this visit and is aware of possible charges through their insurance for this visit.  The other persons participating in this telemedicine service were none. Time spent on call was 16 minutes and in review of previous records 20 minutes total.  This virtual service is not related to other E/M service within previous 7 days.     Patient ID: [name], female    DOB: 08-May-1960, 60 y.o.   MRN: [id number]  HPI Chief Complaint  Patient presents with   Covid Positive    Home test positive yesterday Experiencing cough, H/A, Body aches and sinus congestion. Symptoms started Saturday. Denies Fever/SHOB   Complains of headache, body aches, nasal congestion, cough for the past 3 days. States she tested positive for Covid yesterday. Chest becoming congested.  No fever, chills, chest pain, palpitations, shortness of breath, abdominal pain, N/V/D.   This is her first time having Covid.  She did get 3 Covid vaccines.   Underlying medical conditions include asthma.   Reviewed allergies, medications, past medical, surgical, family, and social history.   Review of Systems Pertinent positives and negatives in the history of present illness.     Objective:   Physical Exam Wt 105 lb (47.6 kg)   LMP 10/31/2016   BMI 19.20 kg/m   And in no acute distress.  Respirations unlabored.  Speaking in complete sentences without difficulty.  Coughing during the visit.      Assessment & Plan:  COVID-19 virus infection - Plan: nirmatrelvir/ritonavir EUA (PAXLOVID) 20 x 150 MG & 10 x 100MG  TABS  Mild persistent asthma without complication  Discussed symptomatic treatment and quarantine.  We also discussed the antiviral medication Paxlovid since she does have  underlying lung disease. She would like to take the medication.  We discussed essential side effects including rebound COVID.  Discussed using albuterol inhaler as needed and red flag symptoms.  She will follow-up for her physical as scheduled in November.

## 2020-12-17 NOTE — Telephone Encounter (Signed)
[facility] called and they did not have Paxlovid, called patient and got a new pharmacy of choice and sent for her-just an FYI.

## 2020-12-26 ENCOUNTER — Ambulatory Visit: Payer: 59 | Admitting: Medical

## 2020-12-26 ENCOUNTER — Other Ambulatory Visit: Payer: Self-pay

## 2020-12-26 ENCOUNTER — Telehealth: Payer: Self-pay | Admitting: *Deleted

## 2020-12-26 VITALS — BP 110/68 | HR 55 | Temp 97.5°F | Resp 16 | Wt 106.4 lb

## 2020-12-26 DIAGNOSIS — J4530 Mild persistent asthma, uncomplicated: Secondary | ICD-10-CM | POA: Diagnosis not present

## 2020-12-26 DIAGNOSIS — R059 Cough, unspecified: Secondary | ICD-10-CM | POA: Diagnosis not present

## 2020-12-26 DIAGNOSIS — Z8616 Personal history of COVID-19: Secondary | ICD-10-CM | POA: Diagnosis not present

## 2020-12-26 DIAGNOSIS — R0600 Dyspnea, unspecified: Secondary | ICD-10-CM | POA: Diagnosis not present

## 2020-12-26 MED ORDER — FLUTICASONE FUROATE-VILANTEROL 200-25 MCG/INH IN AEPB: 1.0000 | INHALATION_SPRAY | Freq: Every day | RESPIRATORY_TRACT | 0 refills | Status: DC

## 2020-12-26 NOTE — Progress Notes (Signed)
Subjective:  [name] is a 60 y.o. female who presents for Chief Complaint  Patient presents with   possible bronchitis    Possible bronchitis- since Monday- had covid last week     Here for f/u on lungs.   Had covid last week, starting 9/23, had symptoms all last week.    Last week symptoms included cough, congestion in head.  No fever.  Earlier this week has heaviness in upper chest, some dyspnea. Feels tired.  Still has cough.   No NVD, no body aches or chills. No production.   No headache.  Using alka seltzer plus.  Has used her inhaler some in recent days. Using 2-3 times daily during this illness.  No recent flare up of GERD, no palpitations or other chest pain.  No other aggravating or relieving factors.    No other c/o.  Past Medical History:  Diagnosis Date   Allergy    Anxiety    Asthma    Chronic back pain    Depression    Eosinophilic esophagitis    Esophageal spasm    Esophageal stricture    Gastritis    GERD (gastroesophageal reflux disease)    Headache    occasional   IBS (irritable bowel syndrome)    Kidney stone 2015   gets every few years, hx/o calcium oxalate stones   Osteoporosis    Scoliosis    Seasonal allergic conjunctivitis    Seasonal allergic rhinitis    Small intestinal bacterial overgrowth    Vitamin D deficiency    Wears glasses    Current Outpatient Medications on File Prior to Visit  Medication Sig Dispense Refill   calcium carbonate (CALCIUM 600) 600 MG TABS tablet Take 1 tablet (600 mg total) by mouth 2 (two) times daily with a meal. (Patient taking differently: Take 600 mg by mouth daily with breakfast.) 180 tablet 3   cholecalciferol (VITAMIN D3) 25 MCG (1000 UT) tablet Take 2 tablets (2,000 Units total) by mouth daily. 180 tablet 3   diclofenac (VOLTAREN) 75 MG EC tablet Take 1 tablet (75 mg total) by mouth 2 (two) times daily. (Patient taking differently: Take 75 mg by mouth as needed.) 45 tablet 2   estradiol (VIVELLE-DOT) 0.025  MG/24HR Place 1 patch onto the skin 2 (two) times a week.      fluticasone (FLONASE) 50 MCG/ACT nasal spray Place 2 sprays into both nostrils daily. 16 g 5   LORazepam (ATIVAN) 0.5 MG tablet Take 1 tablet (0.5 mg total) by mouth 2 (two) times daily as needed for anxiety. 60 tablet 1   methocarbamol (ROBAXIN) 500 MG tablet Take 1 tablet (500 mg total) by mouth 2 (two) times daily as needed (back pain). (Patient taking differently: Take 500 mg by mouth every 6 (six) hours as needed for muscle spasms (back pain).) 45 tablet 2   pantoprazole (PROTONIX) 40 MG tablet Take 1 tablet (40 mg total) by mouth daily. 60 tablet 0   progesterone (PROMETRIUM) 200 MG capsule Take 200 mg by mouth daily.      VENTOLIN HFA 108 (90 Base) MCG/ACT inhaler TAKE 2 PUFFS BY MOUTH EVERY 6 HOURS AS NEEDED FOR WHEEZE OR SHORTNESS OF BREATH 18 g 1   AMBULATORY NON FORMULARY MEDICATION Budesonide slurry 2mg /27ml Sig: 5cc daily x 8 weeks (Patient not taking: No sig reported) 300 mL 2   Peak Flow Meter DEVI 1 each by Does not apply route daily. 1 each 1   No current facility-administered medications on  file prior to visit.     The following portions of the patient's history were reviewed and updated as appropriate: allergies, current medications, past family history, past medical history, past social history, past surgical history and problem list.  ROS Otherwise as in subjective above    Objective: BP 110/68   Pulse (!) 55   Temp (!) 97.5 F (36.4 C)   Resp 16   Wt 106 lb 6.4 oz (48.3 kg)   LMP 10/31/2016   SpO2 98%   BMI 19.46 kg/m   General appearance: alert, no distress, well developed, well nourished HEENT: normocephalic, sclerae anicteric, conjunctiva pink and moist, TMs pearly, nares patent, no discharge or erythema, pharynx normal Oral cavity: MMM, no lesions Neck: supple, no lymphadenopathy, no thyromegaly, no masses Heart: RRR, normal S1, S2, no murmurs Lungs: CTA bilaterally, no wheezes, rhonchi, or  rales Pulses: 2+ radial pulses, 2+ pedal pulses, normal cap refill Ext: no edema   Assessment: Encounter Diagnoses  Name Primary?   Dyspnea, unspecified type Yes   History of COVID-19    Cough, unspecified type    Mild persistent asthma without complication      Plan: I suspect residual inflammation and asthma flare from recent covid infection.  Lungs clear and vitals ok today.  Begin sample of Breo inhaler x 2 weeks, continue albuterol prn, rest, hydrate , and know that fatigue and energy will gradually improve after covid.    [name] was seen today for possible bronchitis.  Diagnoses and all orders for this visit:  Dyspnea, unspecified type  History of COVID-19  Cough, unspecified type  Mild persistent asthma without complication  Other orders -     Discontinue: fluticasone furoate-vilanterol (BREO ELLIPTA) 200-25 MCG/INH AEPB; Inhale 1 puff into the lungs daily. -     Discontinue: fluticasone furoate-vilanterol (BREO ELLIPTA) 200-25 MCG/INH AEPB; Inhale 1 puff into the lungs daily. -     fluticasone furoate-vilanterol (BREO ELLIPTA) 200-25 MCG/INH AEPB; Inhale 1 puff into the lungs daily.   Follow up: prn

## 2020-12-26 NOTE — Telephone Encounter (Signed)
Patient was seen virtually for covid last week by Vickie. She tested negative today but feels worse. SOB, wheezing and chest heaviness. Th\inks this may have turned into bronchitis and she is asthmatic. She would like to come in so you can listen to her lungs-you are her PCP (I held a spot this after noon in case) Please let me know what you think, thanks.

## 2021-01-22 ENCOUNTER — Telehealth: Payer: Self-pay | Admitting: Family Medicine

## 2021-01-22 ENCOUNTER — Other Ambulatory Visit: Payer: Self-pay

## 2021-01-22 MED ORDER — PANTOPRAZOLE SODIUM 40 MG PO TBEC: 40.0000 mg | DELAYED_RELEASE_TABLET | Freq: Every day | ORAL | 6 refills | Status: DC

## 2021-01-22 NOTE — Telephone Encounter (Signed)
Refill provided for Pantoprazole 1 tablet daily.

## 2021-01-22 NOTE — Telephone Encounter (Signed)
Pt states she take Pantoprazole 40 mg once per day for GI doc question to [place]

## 2021-01-25 ENCOUNTER — Encounter: Payer: Self-pay | Admitting: Medical

## 2021-01-25 ENCOUNTER — Other Ambulatory Visit: Payer: Self-pay

## 2021-01-25 ENCOUNTER — Ambulatory Visit: Payer: 59 | Admitting: Medical

## 2021-01-25 VITALS — BP 120/80 | HR 65 | Ht 63.0 in | Wt 106.4 lb

## 2021-01-25 DIAGNOSIS — Z23 Encounter for immunization: Secondary | ICD-10-CM | POA: Diagnosis not present

## 2021-01-25 DIAGNOSIS — Z0000 Encounter for general adult medical examination without abnormal findings: Secondary | ICD-10-CM | POA: Diagnosis not present

## 2021-01-25 DIAGNOSIS — J4530 Mild persistent asthma, uncomplicated: Secondary | ICD-10-CM

## 2021-01-25 DIAGNOSIS — Z13220 Encounter for screening for lipoid disorders: Secondary | ICD-10-CM

## 2021-01-25 DIAGNOSIS — M810 Age-related osteoporosis without current pathological fracture: Secondary | ICD-10-CM

## 2021-01-25 DIAGNOSIS — R002 Palpitations: Secondary | ICD-10-CM

## 2021-01-25 DIAGNOSIS — J301 Allergic rhinitis due to pollen: Secondary | ICD-10-CM

## 2021-01-25 DIAGNOSIS — F419 Anxiety disorder, unspecified: Secondary | ICD-10-CM

## 2021-01-25 DIAGNOSIS — K219 Gastro-esophageal reflux disease without esophagitis: Secondary | ICD-10-CM | POA: Diagnosis not present

## 2021-01-25 DIAGNOSIS — Z1329 Encounter for screening for other suspected endocrine disorder: Secondary | ICD-10-CM

## 2021-01-25 NOTE — Addendum Note (Signed)
Addended by: Minette Headland A on: 01/25/2021 09:29 AM   Modules accepted: Orders

## 2021-01-25 NOTE — Patient Instructions (Addendum)
Osteoporosis:  Taking medications to treat osteoporosis reduces fracture risk by 50-70%!   1 in 45 American women and 1 in 50 men over age 59 years old have osteoporosis.    Recommendations:   Regular exercise including aerobic and weightbearing exercise, vitamin D supplementation, limiting alcohol and not smoking.    We generally recommend adding a medication at this point to reduce your risk of fracture by reducing the rate of bone turnover or helping to increase bone mass.  Examples would be Fosamax weekly oral medication, Prolia injection every 6 months, or other options.   I want you to consider the options below, then let me know what you may want to do.   All of the medications have potential risks, but they all have the benefit of slowing down the rate of bone turnover as we get older, helping to reduce the risk of a bone fracture.  Options for therapy:   Bisphosphonate such as alendronate (Fosamax) This is a medication given weekly that strengthens bones by slowing down the rate of bone loss.  This is usually the first-line medication given the lower cost and does not require injection.  Many people tolerate this medication but some do not.  This medicine has to be taken weekly first thing in the morning on empty stomach, and you cannot lie down for an hour after taking the medication.  Risks of the medication includes trouble swallowing the medication, inflammation of the esophagus, gastric ulcers, and rare risk of breakdown of the jawbone.  These medications are usually given for 5 years (3 years if injectable bisphosphonate like Reclast).   Evenity  Compared with bisphosphonates, this type of medication called monoclonal antibodies produce similar or better bone density results and reduces the chance of all types of fractures. Evenity is delivered via a shot under the skin every month.   Recent research indicates there could be a high risk of spinal column fractures after stopping the  drug.  A very rare complication of bisphosphonates and denosumab is a break or crack in the middle of the thighbone.  A second rare complication is delayed healing of the jawbone (osteonecrosis of the jaw). This can occur after an invasive dental procedure such as removing a tooth.   Forteo This medication increases bone density and strength, it is a synthetic version of the parathyroid hormone, and it is given as a daily injection for 2 years.  Risk include leg cramps, nausea, dizziness, elevated calcium, joint pain, and rare risk of bone cancer in animal studies.  Prolia This is an injection given twice yearly that prevents bone dissolving osteoclast cells from forming.  Its a good option for women who can't tolerate bisphosphonate.  The advantages not having to take something every day or every month and it is well-tolerated for the most part.  This type of medication is called a monoclonal antibody, so there are risk of low blood calcium, skin infections, rash, and rarer potential risks are breakdown or death of jawbone, and rare risk of fracture of the thigh bone.  This medication is given ongoing.  Calcitonin  This is an old drug that helps prevent bone loss, used as a daily nasal spray or injection.  It  reduces spinal fractures, but is not effective in other types of fractures so it is usually not the first-line option.  Side effects can include flushing, rash.  There is a small increase in cancer risk with this medication.  Evista This is a selective  estrogen receptor modulator (SERM), and is used in breast cancer prevention and treatment.  It is also used in treatment of osteoporosis.   It is used in women to reduce risk of vertebral fractures.  Side effects include hot flashes, muscle pain, and increased risks of blood clots in the leg.         Health screening: Advised they see their eye doctor yearly for routine vision care. Advised they see their dentist yearly for routine dental  care including hygiene visits twice yearly. See your gynecologist yearly for routine gynecological care.  Cancer screening Counseled on self breast exams, mammograms, cervical cancer screening  Colonoscopy:  Reviewed colonoscopy on file that is up to date 08/2018  Reviewed normal mammogram 11/2019, normal pap 11/2019  Discussed routine skin surveillance   Vaccinations: Advised yearly influenza vaccine, covid, pneumococcal 23 and shingrix.  she declines today   Counseled on the influenza virus vaccine.  Vaccine information sheet given.  Influenza vaccine given after consent obtained.    Separate significant issues discussed: Palpitation - EKG reviewed.  Labs today. Limiti caffeine, consider Zio monitor if symptom persist.  Asthma and allergies-continue current medication, managed by specialist.  Advised pneumococcal vaccine but she declines.  Vitamin D deficiency -continue supplement, counseled on diet, sun exposure  Anxiety, concentration deficit-uses lorazepam as needed.  GERD-discussed risk and benefits of medicine, continue Protonix.  Discussed risks/benefits of medication  Microscopic hematuria, history of kidney stone-reviewed March 2021 urology visit with Dr. Junious Silk. Stable, no recent hematuria, no other work-up at this time, urology plan to recheck in 1 year including KUB x-ray  Osteoporosis- 08/2018 bpone density test shows osteoporosis.she notes improvement on recent 2022 test through gynecology.  Refuses bisphosphonates, prolia and other typical medications. Advised weightbearing exercise, aerobic exercise, continue vitamin D and calcium, on hormone therapy per gynecology.  We will request recent bone density test from gynecology   IBS-no recent concerns.  Sees gastroenterology

## 2021-01-25 NOTE — Progress Notes (Signed)
Subjective:   HPI  [name] is a 60 y.o. female who presents for Chief Complaint  Patient presents with   fasting cpe    Fasting cpe, [facility]    Patient Care Team: Nakia Remmers, Leward Quan as PCP - General (Family Medicine) Sees dentist:Dr. Max Fickle eye doctor: Melanee Spry Gynecology, [facility], Dr. Theodoro Clock GI: Dr. Zenovia Jarred and Dr. Silvano Rusk Urology, Dr. Festus Aloe Allergy/Asthma - Dr. Prudy Feeler Dermatology, Dr. Donny Pique, Dr. Leanora Cover    Concerns: Recently has had a few days where she had some palpitations or spasm in her chest.  She does not know if it was a muscle or her heart.  She felt a little crummy afterwards but no shortness of breath no lightheadedness no dizziness.  No other chest pain.  She continues on acid reflux medicine daily but cannot seem to get off of it.  Has bad symptoms if she is not taking it.  Osteoporosis-she notes having a bone density test recently that was improved from 2020 and scan.  Her gynecologist advised she continue vitamin D and calcium.  She refuses bisphosphonates and some of the other medications for osteoporosis given the potential risk and side effects.  She is doing weightbearing and aerobic exercise  Reviewed their medical, surgical, family, social, medication, and allergy history and updated chart as appropriate.  Past Medical History:  Diagnosis Date   Allergy    Anxiety    Asthma    Chronic back pain    Depression    Eosinophilic esophagitis    Esophageal spasm    Esophageal stricture    Gastritis    GERD (gastroesophageal reflux disease)    Headache    occasional   IBS (irritable bowel syndrome)    Kidney stone 2015   gets every few years, hx/o calcium oxalate stones   Osteoporosis    Scoliosis    Seasonal allergic conjunctivitis    Seasonal allergic rhinitis    Small intestinal bacterial overgrowth    Vitamin D deficiency    Wears  glasses     Past Surgical History:  Procedure Laterality Date   APPENDECTOMY     BREAST EXCISIONAL BIOPSY     left   COLONOSCOPY  2013   [place], [place]   COLONOSCOPY  08/2018   TA, diverticula   ESOPHAGEAL DILATION     several times prior, [place], [place]   ESOPHAGOPLASTY  0/3546   complication of esophageal dilitation, hospitalization at [place] Right 12/31/2017   Procedure: EXCISION RIGHT WRIST DORSAL GANGLION;  Surgeon: Leanora Cover, MD;  Location: [facility];  Service: Orthopedics;  Laterality: Right;  Bier block   INCISION AND DRAINAGE ABSCESS Bilateral 09/14/2015   Procedure: INCISION AND DRAINAGE ABSCESS;  Surgeon: Leanora Cover, MD;  Location: [facility];  Service: Orthopedics;  Laterality: Bilateral;   KIDNEY STONE SURGERY  1986   LITHOTRIPSY     4x prior as of 05/2014      Family History  Problem Relation Age of Onset   Barrett's esophagus Mother    Diabetes Father    Kidney Stones Father    Hypertension Father    Heart attack Father    Heart disease Father 60       MI , stent   Diabetes Sister    Kidney Stones Brother    Migraines Brother    Urolithiasis Brother  Diabetes Brother    Headache Brother    Heart disease Maternal Grandfather        CABG   Barrett's esophagus Paternal Grandfather    Heart disease Maternal Grandmother 66       rheumatic related heart disesae   Cancer Neg Hx    Stroke Neg Hx    Colon cancer Neg Hx    Esophageal cancer Neg Hx    Stomach cancer Neg Hx    Rectal cancer Neg Hx      Current Outpatient Medications:    AMBULATORY NON FORMULARY MEDICATION, Budesonide slurry 2mg /26ml Sig: 5cc daily x 8 weeks, Disp: 300 mL, Rfl: 2   calcium carbonate (CALCIUM 600) 600 MG TABS tablet, Take 1 tablet (600 mg total) by mouth 2 (two) times daily with a meal. (Patient taking differently: Take 600 mg by mouth daily with breakfast.), Disp: 180 tablet, Rfl: 3   cholecalciferol (VITAMIN  D3) 25 MCG (1000 UT) tablet, Take 2 tablets (2,000 Units total) by mouth daily., Disp: 180 tablet, Rfl: 3   estradiol (VIVELLE-DOT) 0.025 MG/24HR, Place 1 patch onto the skin 2 (two) times a week. , Disp: , Rfl:    fluticasone (FLONASE) 50 MCG/ACT nasal spray, Place 2 sprays into both nostrils daily., Disp: 16 g, Rfl: 5   LORazepam (ATIVAN) 0.5 MG tablet, Take 1 tablet (0.5 mg total) by mouth 2 (two) times daily as needed for anxiety., Disp: 60 tablet, Rfl: 1   pantoprazole (PROTONIX) 40 MG tablet, Take 1 tablet (40 mg total) by mouth daily., Disp: 30 tablet, Rfl: 6   progesterone (PROMETRIUM) 200 MG capsule, Take 200 mg by mouth daily. , Disp: , Rfl:    VENTOLIN HFA 108 (90 Base) MCG/ACT inhaler, TAKE 2 PUFFS BY MOUTH EVERY 6 HOURS AS NEEDED FOR WHEEZE OR SHORTNESS OF BREATH, Disp: 18 g, Rfl: 1   diclofenac (VOLTAREN) 75 MG EC tablet, Take 1 tablet (75 mg total) by mouth 2 (two) times daily. (Patient not taking: Reported on 01/25/2021), Disp: 45 tablet, Rfl: 2   fluticasone furoate-vilanterol (BREO ELLIPTA) 200-25 MCG/INH AEPB, Inhale 1 puff into the lungs daily. (Patient not taking: Reported on 01/25/2021), Disp: 14 each, Rfl: 0   methocarbamol (ROBAXIN) 500 MG tablet, Take 1 tablet (500 mg total) by mouth 2 (two) times daily as needed (back pain). (Patient not taking: Reported on 01/25/2021), Disp: 45 tablet, Rfl: 2  No Known Allergies   Review of Systems Constitutional: -fever, -chills, -sweats, -unexpected weight change, -decreased appetite, -fatigue Allergy: -sneezing, -itching, -congestion Dermatology: -changing moles, --rash, -lumps ENT: -runny nose, -ear pain, -sore throat, -hoarseness, -sinus pain, -teeth pain, - ringing in ears, -hearing loss, -nosebleeds Cardiology: -chest pain, +palpitations, -swelling, -difficulty breathing when lying flat, -waking up short of breath Respiratory: -cough, -shortness of breath, -difficulty breathing with exercise or exertion, -wheezing, -coughing up  blood Gastroenterology: -abdominal pain, -nausea, -vomiting, -diarrhea, -constipation, -blood in stool, -changes in bowel movement, -difficulty swallowing or eating Hematology: -bleeding, -bruising  Musculoskeletal: -joint aches, -muscle aches, -joint swelling, -back pain, -neck pain, -cramping, -changes in gait Ophthalmology: denies vision changes, eye redness, itching, discharge Urology: -burning with urination, -difficulty urinating, -blood in urine, -urinary frequency, -urgency, -incontinence Neurology: -headache, -weakness, -tingling, -numbness, -memory loss, -falls, -dizziness Psychology: -depressed mood, -agitation, -sleep problems Breast/gyn: -breast tendnerss, -discharge, -lumps, -vaginal discharge,- irregular periods, -heavy periods       Objective:  BP 120/80   Pulse 65   Ht 5\' 3"  (1.6 m)   Wt 106 lb 6.4  oz (48.3 kg)   LMP 10/31/2016   BMI 18.85 kg/m   Wt Readings from Last 3 Encounters:  01/25/21 106 lb 6.4 oz (48.3 kg)  12/26/20 106 lb 6.4 oz (48.3 kg)  12/17/20 105 lb (47.6 kg)     General appearence: alert, no distress, WD/WN, lean white female Skin: Scattered macules, no worrisome lesions  HEENT: normocephalic, sclerae anicteric, PERRLA, EOMi, nares patent, no discharge or erythema, pharynx normal Oral cavity: MMM, no lesions Neck: supple, no lymphadenopathy, no thyromegaly, no masses Heart: RRR, normal S1, S2, no murmurs Lungs: CTA bilaterally, no wheezes, rhonchi, or rales Abdomen: +bs, soft, non tender, non distended, no masses, no hepatomegaly, no splenomegaly Back: non tender, scoliosis noted Musculoskeletal: nontender, no swelling, no obvious deformity Extremities: no edema, no cyanosis, no clubbing Pulses: 2+ symmetric, upper and lower extremities, normal cap refill Neurological: alert, oriented x 3, CN2-12 intact, strength normal upper extremities and lower extremities, sensation normal throughout, DTRs 2+ throughout, no cerebellar signs, gait  normal Psychiatric: normal affect, behavior normal, pleasant  Breast, GYN, rectal-deferred to gynecology   EKG Indication palpitation, physical, rate 59 bpm, PR 130 ms, QRS 60 ms, QTC 399 ms, axis -36 degrees, sinus bradycardia, left axis deviation, no change from 2021 EKG    Assessment and Plan :   Encounter Diagnoses  Name Primary?   Palpitation Yes   Routine general medical examination at a health care facility    Osteoporosis, unspecified osteoporosis type, unspecified pathological fracture presence    Gastroesophageal reflux disease without esophagitis    Mild persistent asthma without complication    Anxiety    Allergic rhinitis due to pollen, unspecified seasonality    Screening for thyroid disorder    Screening for lipid disorders      Physical exam - discussed and counseled on healthy lifestyle, diet, exercise, preventative care, vaccinations, sick and well care, proper use of emergency dept and after hours care, and addressed their concerns.    Health screening: Advised they see their eye doctor yearly for routine vision care. Advised they see their dentist yearly for routine dental care including hygiene visits twice yearly. See your gynecologist yearly for routine gynecological care.  Cancer screening Counseled on self breast exams, mammograms, cervical cancer screening  Colonoscopy:  Reviewed colonoscopy on file that is up to date 08/2018  Reviewed normal mammogram 11/2019, normal pap 11/2019  Discussed routine skin surveillance   Vaccinations: Advised yearly influenza vaccine, covid, pneumococcal 23 and shingrix.  she declines today   Counseled on the influenza virus vaccine.  Vaccine information sheet given.  Influenza vaccine given after consent obtained.    Separate significant issues discussed: Palpitation - EKG reviewed.  Labs today. Limiti caffeine, consider Zio monitor if symptom persist.  Asthma and allergies-continue current medication,  managed by specialist.  Advised pneumococcal vaccine but she declines.  Vitamin D deficiency -continue supplement, counseled on diet, sun exposure  Anxiety, concentration deficit-uses lorazepam as needed.  GERD-discussed risk and benefits of medicine, continue Protonix.  Discussed risks/benefits of medication  Microscopic hematuria, history of kidney stone-reviewed March 2021 urology visit with Dr. Junious Silk. Stable, no recent hematuria, no other work-up at this time, urology plan to recheck in 1 year including K[facility] x-ray  Osteoporosis- 08/2018 bpone density test shows osteoporosis.she notes improvement on recent 2022 test through gynecology.  Refuses bisphosphonates, prolia and other typical medications. Advised weightbearing exercise, aerobic exercise, continue vitamin D and calcium, on hormone therapy per gynecology.  We will request recent bone density test  from gynecology   IBS-no recent concerns.  Sees gastroenterology    Alitza was seen today for fasting cpe.  Diagnoses and all orders for this visit:  Palpitation -     Magnesium -     EKG 12-Lead  Routine general medical examination at a health care facility -     Comprehensive metabolic panel -     CBC with Differential/Platelet -     Lipid panel -     VITAMIN D 25 Hydroxy (Vit-D Deficiency, Fractures) -     TSH -     Magnesium -     EKG 12-Lead  Osteoporosis, unspecified osteoporosis type, unspecified pathological fracture presence -     VITAMIN D 25 Hydroxy (Vit-D Deficiency, Fractures)  Gastroesophageal reflux disease without esophagitis  Mild persistent asthma without complication  Anxiety  Allergic rhinitis due to pollen, unspecified seasonality  Screening for thyroid disorder -     TSH  Screening for lipid disorders -     Lipid panel    Follow-up pending labs, yearly for physical

## 2021-01-26 LAB — LIPID PANEL
Chol/HDL Ratio: 2.8 ratio (ref 0.0–4.4)
Cholesterol, Total: 228 mg/dL — ABNORMAL HIGH (ref 100–199)
HDL: 81 mg/dL (ref >39)
LDL Chol Calc (NIH): 135 mg/dL — ABNORMAL HIGH (ref 0–99)
Triglycerides: 68 mg/dL (ref 0–149)
VLDL Cholesterol Cal: 12 mg/dL (ref 5–40)

## 2021-01-26 LAB — COMPREHENSIVE METABOLIC PANEL
ALT: 17 IU/L (ref 0–32)
AST: 22 IU/L (ref 0–40)
Albumin/Globulin Ratio: 2.1 (ref 1.2–2.2)
Albumin: 4.8 g/dL (ref 3.8–4.9)
Alkaline Phosphatase: 75 IU/L (ref 44–121)
BUN/Creatinine Ratio: 16 (ref 9–23)
BUN: 16 mg/dL (ref 6–24)
Bilirubin Total: 0.4 mg/dL (ref 0.0–1.2)
CO2: 24 mmol/L (ref 20–29)
Calcium: 10 mg/dL (ref 8.7–10.2)
Chloride: 102 mmol/L (ref 96–106)
Creatinine, Ser: 0.98 mg/dL (ref 0.57–1.00)
Globulin, Total: 2.3 g/dL (ref 1.5–4.5)
Glucose: 91 mg/dL (ref 70–99)
Potassium: 4.7 mmol/L (ref 3.5–5.2)
Sodium: 140 mmol/L (ref 134–144)
Total Protein: 7.1 g/dL (ref 6.0–8.5)
eGFR: 67 mL/min/{1.73_m2} (ref >59)

## 2021-01-26 LAB — CBC WITH DIFFERENTIAL/PLATELET
Basophils Absolute: 0.1 10*3/uL (ref 0.0–0.2)
Basos: 1 %
EOS (ABSOLUTE): 0.4 10*3/uL (ref 0.0–0.4)
Eos: 7 %
Hematocrit: 40.6 % (ref 34.0–46.6)
Hemoglobin: 13.9 g/dL (ref 11.1–15.9)
Immature Grans (Abs): 0 10*3/uL (ref 0.0–0.1)
Immature Granulocytes: 0 %
Lymphocytes Absolute: 1.5 10*3/uL (ref 0.7–3.1)
Lymphs: 25 %
MCH: 31.6 pg (ref 26.6–33.0)
MCHC: 34.2 g/dL (ref 31.5–35.7)
MCV: 92 fL (ref 79–97)
Monocytes Absolute: 0.3 10*3/uL (ref 0.1–0.9)
Monocytes: 6 %
Neutrophils Absolute: 3.7 10*3/uL (ref 1.4–7.0)
Neutrophils: 61 %
Platelets: 334 10*3/uL (ref 150–450)
RBC: 4.4 x10E6/uL (ref 3.77–5.28)
RDW: 11.8 % (ref 11.7–15.4)
WBC: 5.9 10*3/uL (ref 3.4–10.8)

## 2021-01-26 LAB — MAGNESIUM: Magnesium: 2 mg/dL (ref 1.6–2.3)

## 2021-01-26 LAB — TSH: TSH: 1.99 u[IU]/mL (ref 0.450–4.500)

## 2021-01-26 LAB — VITAMIN D 25 HYDROXY (VIT D DEFICIENCY, FRACTURES): Vit D, 25-Hydroxy: 72.2 ng/mL (ref 30.0–100.0)

## 2021-02-04 ENCOUNTER — Encounter: Payer: Self-pay | Admitting: Internal Medicine

## 2021-02-07 ENCOUNTER — Encounter: Payer: Self-pay | Admitting: Medical

## 2021-05-02 ENCOUNTER — Ambulatory Visit: Payer: 59 | Admitting: Medical

## 2021-05-02 ENCOUNTER — Other Ambulatory Visit: Payer: Self-pay

## 2021-05-02 VITALS — BP 130/82 | HR 63 | Temp 98.7°F | Wt 109.6 lb

## 2021-05-02 DIAGNOSIS — F419 Anxiety disorder, unspecified: Secondary | ICD-10-CM

## 2021-05-02 DIAGNOSIS — R002 Palpitations: Secondary | ICD-10-CM | POA: Diagnosis not present

## 2021-05-02 DIAGNOSIS — K219 Gastro-esophageal reflux disease without esophagitis: Secondary | ICD-10-CM

## 2021-05-02 DIAGNOSIS — J4530 Mild persistent asthma, uncomplicated: Secondary | ICD-10-CM

## 2021-05-02 DIAGNOSIS — R030 Elevated blood-pressure reading, without diagnosis of hypertension: Secondary | ICD-10-CM | POA: Diagnosis not present

## 2021-05-02 MED ORDER — LORAZEPAM 0.5 MG PO TABS: 0.5000 mg | ORAL_TABLET | Freq: Two times a day (BID) | ORAL | 1 refills | Status: DC | PRN

## 2021-05-02 NOTE — Progress Notes (Signed)
Subjective:  [name] is a 61 y.o. female who presents for Chief Complaint  Patient presents with   bp running high    Bp running high, bottom number was in the 80's and usually 60. Bp was 139/81      Here for concerns about elevated blood pressure  She recently was checking her BPs as she was feeling her heart racing, couldn't sleep.  Used mother's meter and started seeing some elevated readings.   Been seeing diastolic in the 02D.  In the past she has been in the 90/70 range.  80s DBP seems high to her.   SBPs in the 130s.  Saw elevated readings 3 days in a row.   One night recently when she slept horribly was the day she had to switch to generic hormone patch.  This was a recent changes in medication per pharmacy.  She notes that her medication for hormone patches seems to change manufactures every month.  Looking through the chart she has a history of use of diclofenac but she says she has not used this in the past year  She has been under some stress.  She is helping to take care of her father who has Alzheimer's.  Over weekend had pain in chest from her esophagitis and long term GERD issues  Still has had various episodes of palpations in recent months  2 nights ago when she couldn't sleep she used ativan for a few nights.  Using Ativan does help in moments of stress or difficulty sleeping  Asthma-no recent problems, has not had to use inhaler recently  No other aggravating or relieving factors.    No other c/o.  Past Medical History:  Diagnosis Date   Allergy    Anxiety    Asthma    Chronic back pain    Depression    Eosinophilic esophagitis    Esophageal spasm    Esophageal stricture    Gastritis    GERD (gastroesophageal reflux disease)    Headache    occasional   IBS (irritable bowel syndrome)    Kidney stone 2015   gets every few years, hx/o calcium oxalate stones   Osteoporosis    Scoliosis    Seasonal allergic conjunctivitis    Seasonal allergic  rhinitis    Small intestinal bacterial overgrowth    Vitamin D deficiency    Wears glasses     Current Outpatient Medications on File Prior to Visit  Medication Sig Dispense Refill   AMBULATORY NON FORMULARY MEDICATION Budesonide slurry 2mg /30ml Sig: 5cc daily x 8 weeks 300 mL 2   calcium carbonate (CALCIUM 600) 600 MG TABS tablet Take 1 tablet (600 mg total) by mouth 2 (two) times daily with a meal. (Patient taking differently: Take 600 mg by mouth daily with breakfast.) 180 tablet 3   cholecalciferol (VITAMIN D3) 25 MCG (1000 UT) tablet Take 2 tablets (2,000 Units total) by mouth daily. 180 tablet 3   diclofenac (VOLTAREN) 75 MG EC tablet Take 1 tablet (75 mg total) by mouth 2 (two) times daily. 45 tablet 2   estradiol (VIVELLE-DOT) 0.025 MG/24HR Place 1 patch onto the skin 2 (two) times a week.      fluticasone (FLONASE) 50 MCG/ACT nasal spray Place 2 sprays into both nostrils daily. 16 g 5   pantoprazole (PROTONIX) 40 MG tablet Take 1 tablet (40 mg total) by mouth daily. 30 tablet 6   progesterone (PROMETRIUM) 200 MG capsule Take 200 mg by mouth daily.  VENTOLIN HFA 108 (90 Base) MCG/ACT inhaler TAKE 2 PUFFS BY MOUTH EVERY 6 HOURS AS NEEDED FOR WHEEZE OR SHORTNESS OF BREATH 18 g 1   methocarbamol (ROBAXIN) 500 MG tablet Take 1 tablet (500 mg total) by mouth 2 (two) times daily as needed (back pain). (Patient not taking: Reported on 01/25/2021) 45 tablet 2   No current facility-administered medications on file prior to visit.    The following portions of the patient's history were reviewed and updated as appropriate: allergies, current medications, past family history, past medical history, past social history, past surgical history and problem list.  ROS Otherwise as in subjective above    Objective: BP 130/82    Pulse 63    Temp 98.7 F (37.1 C)    Wt 109 lb 9.6 oz (49.7 kg)    LMP 10/31/2016    BMI 19.41 kg/m   BP Readings from Last 3 Encounters:  05/02/21 130/82   01/25/21 120/80  12/26/20 110/68   Wt Readings from Last 3 Encounters:  05/02/21 109 lb 9.6 oz (49.7 kg)  01/25/21 106 lb 6.4 oz (48.3 kg)  12/26/20 106 lb 6.4 oz (48.3 kg)    General appearance: alert, no distress, well developed, well nourished Neck: supple, no lymphadenopathy, no thyromegaly, no masses Heart: RRR, normal S1, S2, no murmurs Lungs: CTA bilaterally, no wheezes, rhonchi, or rales Abdomen: +bs, soft, non tender, non distended, no masses, no hepatomegaly, no splenomegaly Pulses: 2+ radial pulses, 2+ pedal pulses, normal cap refill Ext: no edema Neuro: CN II through XII intact, nonfocal exam Psych: Pleasant, answers questions appropriately    Assessment: Encounter Diagnoses  Name Primary?   Elevated blood-pressure reading, without diagnosis of hypertension Yes   Palpitation    Mild persistent asthma without complication    Gastroesophageal reflux disease without esophagitis    Anxiety      Plan: We discussed her concerns today for elevated blood pressure readings.  We discussed that her recent short-term readings are in the borderline range however she has a long consistent history of normal blood pressure readings.  We discussed the various things that can raise blood pressures including stress, pain, sleep problems, chronic NSAID use and other.  I reviewed back over her prior studies including normal sleep study 2021, Reviewed 10/28/2017 echo which wsa normal, EF 55-60% , no abnormal wall motion, no valve disease.  EKG from 01/25/21 reviewed, no acute changes at that time.  I reviewed back over her labs from her physical in November 2022.  No renal issue or electrolyte disturbance  I suspect her recent fluctuations are due to stress and possibly due to change in her hormone patch many factor  She will continue to monitor blood pressures for now maybe 3 days/week when not feeling stressed.  She will will record these and take these to her upcoming cardiology  visit  Referral back to cardiology given intermittent palpitations for the last few months.  She last saw Dr. Tamala Julian in 2019  Asthma-no recent concerns  GERD-long-term intermittent problems.  Continue PPI, avoidance of triggers  Anxiety-advise she use the Ativan as needed.  Continue efforts to decrease stress where possible.  Use good stress reduction techniques such as regular exercise, deep breathing techniques    Camauri was seen today for bp running high.  Diagnoses and all orders for this visit:  Elevated blood-pressure reading, without diagnosis of hypertension  Palpitation -     Ambulatory referral to Cardiology  Mild persistent asthma without complication  Gastroesophageal reflux disease without esophagitis  Anxiety  Other orders -     LORazepam (ATIVAN) 0.5 MG tablet; Take 1 tablet (0.5 mg total) by mouth 2 (two) times daily as needed for anxiety.    Follow up: Pending referral

## 2021-05-15 ENCOUNTER — Ambulatory Visit (INDEPENDENT_AMBULATORY_CARE_PROVIDER_SITE_OTHER): Payer: 59

## 2021-05-15 ENCOUNTER — Encounter: Payer: Self-pay | Admitting: Cardiovascular Disease

## 2021-05-15 ENCOUNTER — Other Ambulatory Visit: Payer: Self-pay

## 2021-05-15 ENCOUNTER — Ambulatory Visit: Payer: 59 | Admitting: Cardiovascular Disease

## 2021-05-15 VITALS — BP 136/81 | HR 61 | Ht 62.0 in | Wt 110.6 lb

## 2021-05-15 DIAGNOSIS — R002 Palpitations: Secondary | ICD-10-CM | POA: Diagnosis not present

## 2021-05-15 DIAGNOSIS — R072 Precordial pain: Secondary | ICD-10-CM

## 2021-05-15 DIAGNOSIS — Z83438 Family history of other disorder of lipoprotein metabolism and other lipidemia: Secondary | ICD-10-CM | POA: Diagnosis not present

## 2021-05-15 NOTE — Patient Instructions (Addendum)
Medication Instructions:  No changes *If you need a refill on your cardiac medications before your next appointment, please call your pharmacy*   Lab Work: None ordered If you have labs (blood work) drawn today and your tests are completely normal, you will receive your results only by: [facility] (if you have MyChart) OR A paper copy in the mail If you have any lab test that is abnormal or we need to change your treatment, we will call you to review the results.   Testing/Procedures:  Dr. Sallyanne Kuster has ordered a CT coronary calcium score.   Test locations:  [facility] ([address] [place], [place] [address]) [facility] ([address] [place], [place] [address])   This is $99 out of pocket.   Coronary CalciumScan A coronary calcium scan is an imaging test used to look for deposits of calcium and other fatty materials (plaques) in the inner lining of the blood vessels of the heart (coronary arteries). These deposits of calcium and plaques can partly clog and narrow the coronary arteries without producing any symptoms or warning signs. This puts a person at risk for a heart attack. This test can detect these deposits before symptoms develop. Tell a health care provider about: Any allergies you have. All medicines you are taking, including vitamins, herbs, eye drops, creams, and over-the-counter medicines. Any problems you or family members have had with anesthetic medicines. Any blood disorders you have. Any surgeries you have had. Any medical conditions you have. Whether you are pregnant or may be pregnant. What are the risks? Generally, this is a safe procedure. However, problems may occur, including: Harm to a pregnant woman and her unborn baby. This test involves the use of radiation. Radiation exposure can be dangerous to a pregnant woman and her unborn baby. If you are pregnant, you generally should not have this procedure done. Slight increase in  the risk of cancer. This is because of the radiation involved in the test. What happens before the procedure? No preparation is needed for this procedure. What happens during the procedure? You will undress and remove any jewelry around your neck or chest. You will put on a hospital gown. Sticky electrodes will be placed on your chest. The electrodes will be connected to an electrocardiogram (ECG) machine to record a tracing of the electrical activity of your heart. A CT scanner will take pictures of your heart. During this time, you will be asked to lie still and hold your breath for 2-3 seconds while a picture of your heart is being taken. The procedure may vary among health care providers and hospitals. What happens after the procedure? You can get dressed. You can return to your normal activities. It is up to you to get the results of your test. Ask your health care provider, or the department that is doing the test, when your results will be ready. Summary A coronary calcium scan is an imaging test used to look for deposits of calcium and other fatty materials (plaques) in the inner lining of the blood vessels of the heart (coronary arteries). Generally, this is a safe procedure. Tell your health care provider if you are pregnant or may be pregnant. No preparation is needed for this procedure. A CT scanner will take pictures of your heart. You can return to your normal activities after the scan is done. This information is not intended to replace advice given to you by your health care provider. Make sure you discuss any questions you  have with your health care provider. Document Released: 09/06/2007 Document Revised: 01/28/2016 Document Reviewed: 01/28/2016 Elsevier Interactive Patient Education  2017 [employer] Term Monitor Instructions  Your physician has requested you wear a ZIO patch monitor for 14 days.  This is a single patch monitor. [employer] supplies one patch  monitor per enrollment. Additional stickers are not available. Please do not apply patch if you will be having a Nuclear Stress Test,  Echocardiogram, Cardiac CT, MRI, or Chest Xray during the period you would be wearing the  monitor. The patch cannot be worn during these tests. You cannot remove and re-apply the  ZIO XT patch monitor.  Your ZIO patch monitor will be mailed 3 day USPS to your address on file. It may take 3-5 days  to receive your monitor after you have been enrolled.  Once you have received your monitor, please review the enclosed instructions. Your monitor  has already been registered assigning a specific monitor serial # to you.  Billing and Patient Assistance Program Information  We have supplied [employer] with any of your insurance information on file for billing purposes. [employer] offers a sliding scale Patient Assistance Program for patients that do not have  insurance, or whose insurance does not completely cover the cost of the ZIO monitor.  You must apply for the Patient Assistance Program to qualify for this discounted rate.  To apply, please call [employer] at [phone], select option 4, select option 2, ask to apply for  Patient Assistance Program. Theodore Demark will ask your household income, and how many people  are in your household. They will quote your out-of-pocket cost based on that information.  [employer] will also be able to set up a 39-month, interest-free payment plan if needed.  Applying the monitor   Shave hair from upper left chest.  Hold abrader disc by orange tab. Rub abrader in 40 strokes over the upper left chest as  indicated in your monitor instructions.  Clean area with 4 enclosed alcohol pads. Let dry.  Apply patch as indicated in monitor instructions. Patch will be placed under collarbone on left  side of chest with arrow pointing upward.  Rub patch adhesive wings for 2 minutes. Remove white label marked "1". Remove the white  label marked "2".  Rub patch adhesive wings for 2 additional minutes.  While looking in a mirror, press and release button in center of patch. A small green light will  flash 3-4 times. This will be your only indicator that the monitor has been turned on.  Do not shower for the first 24 hours. You may shower after the first 24 hours.  Press the button if you feel a symptom. You will hear a small click. Record Date, Time and  Symptom in the Patient Logbook.  When you are ready to remove the patch, follow instructions on the last 2 pages of Patient  Logbook. Stick patch monitor onto the last page of Patient Logbook.  Place Patient Logbook in the blue and white box. Use locking tab on box and tape box closed  securely. The blue and white box has prepaid postage on it. Please place it in the mailbox as  soon as possible. Your physician should have your test results approximately 7 days after the  monitor has been mailed back to [facility].  Call [facility] at [phone] if you have questions regarding  your ZIO XT patch monitor. Call them immediately if you see an orange light  blinking on your  monitor.  If your monitor falls off in less than 4 days, contact our Monitor department at [phone].  If your monitor becomes loose or falls off after 4 days call [employer] at [phone] for  suggestions on securing your monitor    Follow-Up: At [facility], you and your health needs are our priority.  As part of our continuing mission to provide you with exceptional heart care, we have created designated Provider Care Teams.  These Care Teams include your primary Cardiologist (physician) and Advanced Practice Providers (APPs -  Physician Assistants and Nurse Practitioners) who all work together to provide you with the care you need, when you need it.  We recommend signing up for the patient portal called "MyChart".  Sign up information is provided on this After Visit Summary.  MyChart is  used to connect with patients for Virtual Visits (Telemedicine).  Patients are able to view lab/test results, encounter notes, upcoming appointments, etc.  Non-urgent messages can be sent to your provider as well.   To learn more about what you can do with MyChart, go to [email or web address].    Your next appointment:   Follow up as needed with Dr. Sallyanne Kuster

## 2021-05-15 NOTE — Progress Notes (Signed)
Cardiology Office Note:    Date:  05/15/2021   ID:  Ekaterini Capitano, DOB 1960-07-11, MRN [id number]  PCP:  Carlena Hurl, PA-C   [facility] Providers Cardiologist:  Sanda Klein, MD     Referring MD: Carlena Hurl, PA-C   No chief complaint on file. Sharniece Gibbon is a 61 y.o. female who is being seen today for the evaluation of palpitations at the request of Caryl Ada.   History of Present Illness:    Rosey Eide is a 61 y.o. female with a hx of GERD complicated by esophageal stricture and esophageal spasm, anxiety, asthma, nephrolithiasis requiring previous surgery and lithotripsy, scoliosis who has recently noticed elevation in blood pressure and palpitations.  She does not have true hypertension but has noticed that her previous blood pressure that used to be in the 90-100/60-70 range has now increased to the 120s-low 140/70s-80s range.  This happened after she had to move in with her parents, where she has lived for approximately the last year.  She states that her diet has been less healthy, richer in salt and she has been less active since then.  She sold her own home and has been unable to find an appropriate new residence.  Her elderly father has dementia and has a very limited palate of foods that he will eat, most of them not particularly healthy and many times urgent sodium.  She has noticed that she has occasional chest pressure, sometimes chest pain that occurs at rest, rarely while she is walking.  It is definitely not precipitated by exertion.  She has occasional palpitations that sound like isolated ectopic beats, roughly once a week.  She denies exertional dyspnea, lower extremity edema, orthopnea, PND, syncope, intermittent claudication or focal neurological events.  In 2019 as part of evaluation for chest pain, she underwent echocardiography and treadmill stress testing with normal results that she was able to complete 9 minutes on the Bruce  protocol with normal blood pressure response and no angina or ECG changes.  She is worried about the strong family history of hypercholesterolemia and heart disease.  Past Medical History:  Diagnosis Date   Allergy    Anxiety    Asthma    Chronic back pain    Depression    Eosinophilic esophagitis    Esophageal spasm    Esophageal stricture    Gastritis    GERD (gastroesophageal reflux disease)    Headache    occasional   IBS (irritable bowel syndrome)    Kidney stone 2015   gets every few years, hx/o calcium oxalate stones   Osteoporosis    Scoliosis    Seasonal allergic conjunctivitis    Seasonal allergic rhinitis    Small intestinal bacterial overgrowth    Vitamin D deficiency    Wears glasses     Past Surgical History:  Procedure Laterality Date   APPENDECTOMY     BREAST EXCISIONAL BIOPSY     left   [place]   COLONOSCOPY  08/2018   [place], diverticula   ESOPHAGEAL DILATION     several times prior, [place], [place]   ESOPHAGOPLASTY  05/5595   complication of esophageal dilitation, hospitalization at [facility] Right 12/31/2017   Procedure: EXCISION RIGHT WRIST DORSAL GANGLION;  Surgeon: Leanora Cover, MD;  Location: [facility];  Service: Orthopedics;  Laterality: Right;  Bier block   INCISION AND DRAINAGE ABSCESS Bilateral  09/14/2015   Procedure: INCISION AND DRAINAGE ABSCESS;  Surgeon: Leanora Cover, MD;  Location: [place];  Service: Orthopedics;  Laterality: Bilateral;   KIDNEY STONE SURGERY  1986   LITHOTRIPSY     4x prior as of 05/2014    Current Medications: Current Meds  Medication Sig   betamethasone dipropionate 0.05 % cream Apply topically 2 (two) times daily as needed.   calcium carbonate (CALCIUM 600) 600 MG TABS tablet Take 1 tablet (600 mg total) by mouth 2 (two) times daily with a meal. (Patient taking differently: Take 600 mg by mouth daily with breakfast.)    cholecalciferol (VITAMIN D3) 25 MCG (1000 UT) tablet Take 2 tablets (2,000 Units total) by mouth daily.   estradiol (VIVELLE-DOT) 0.025 MG/24HR Place 1 patch onto the skin 2 (two) times a week.    fluticasone (FLONASE) 50 MCG/ACT nasal spray Place 2 sprays into both nostrils daily.   methocarbamol (ROBAXIN) 500 MG tablet Take 1 tablet (500 mg total) by mouth 2 (two) times daily as needed (back pain).   pantoprazole (PROTONIX) 40 MG tablet Take 1 tablet (40 mg total) by mouth daily.   progesterone (PROMETRIUM) 200 MG capsule Take 200 mg by mouth daily.    VENTOLIN HFA 108 (90 Base) MCG/ACT inhaler TAKE 2 PUFFS BY MOUTH EVERY 6 HOURS AS NEEDED FOR WHEEZE OR SHORTNESS OF BREATH     Allergies:   Patient has no known allergies.   Social History   Socioeconomic History   Marital status: Single    Spouse name: Not on file   Number of children: 0   Years of education: Not on file   Highest education level: Not on file  Occupational History   Occupation: Tax adviser    Comment: [employer]  Tobacco Use   Smoking status: Never   Smokeless tobacco: Never  Vaping Use   Vaping Use: Never used  Substance and Sexual Activity   Alcohol use: Yes    Alcohol/week: 4.0 standard drinks    Types: 4 Glasses of wine per week   Drug use: No   Sexual activity: Not on file  Other Topics Concern   Not on file  Social History Narrative   2 dogs, muts [place] and [place], exercise 3-4 days per week with walking, Yoga, weights, CPA.  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   01/2020   Social Determinants of Health   Financial Resource Strain: Not on file  Food Insecurity: Not on file  Transportation Needs: Not on file  Physical Activity: Not on file  Stress: Not on file  Social Connections: Not on file     Family History: The patient's family history includes Barrett's esophagus in her mother and paternal grandfather; Diabetes in her brother, father, and sister; Headache in her  brother; Heart attack in her father; Heart disease in her maternal grandfather; Heart disease (age of onset: 59) in her maternal grandmother; Heart disease (age of onset: 79) in her father; Hypertension in her father; Kidney Stones in her brother and father; Migraines in her brother; Urolithiasis in her brother. There is no history of Cancer, Stroke, Colon cancer, Esophageal cancer, Stomach cancer, or Rectal cancer.  ROS:   Please see the history of present illness.     All other systems reviewed and are negative.  EKGs/Labs/Other Studies Reviewed:    The following studies were reviewed today: Echocardiogram  2019  - Left ventricle: The cavity size was normal. Systolic function was    normal.  The estimated ejection fraction was in the range of 55%    to 60%. Wall motion was normal; there were no regional wall    motion abnormalities. Left ventricular diastolic function    parameters were normal.  - Aortic valve: Transvalvular velocity was within the normal range.    There was no stenosis. There was no regurgitation.  - Mitral valve: There was no regurgitation.  - Right ventricle: Systolic function was normal.  - Atrial septum: No defect or patent foramen ovale was identified.  - Tricuspid valve: There was trivial regurgitation.  - Pulmonic valve: There was no significant regurgitation.   Impressions:   - Normal LV systolic and diastolic function, no wall motion    abnormalities. No significant valvular disease.   treadmill stress testing 2019  Blood pressure demonstrated a normal response to exercise. There was no ST segment deviation noted during stress.   Exercise treadmill with normal exercise capacity (9:00); normal blood pressure response; no chest pain; no ST changes; negative adequate exercise tolerance test; Duke treadmill score 9.    EKG:  EKG is  ordered today.  The ekg ordered today demonstrates normal sinus rhythm, very mild left axis deviation, otherwise normal  tracing without repolarization abnormalities.  Recent Labs: 01/25/2021: ALT 17; BUN 16; Creatinine, Ser 0.98; Hemoglobin 13.9; Magnesium 2.0; Platelets 334; Potassium 4.7; Sodium 140; TSH 1.990  Recent Lipid Panel    Component Value Date/Time   CHOL 228 (H) 01/25/2021 0910   TRIG 68 01/25/2021 0910   HDL 81 01/25/2021 0910   CHOLHDL 2.8 01/25/2021 0910   CHOLHDL 2.4 10/21/2016 0812   VLDL 17 10/21/2016 0812   LDLCALC 135 (H) 01/25/2021 0910     Risk Assessment/Calculations:           Physical Exam:    VS:  BP 136/81    Pulse 61    Ht 5\' 2"  (1.575 m)    Wt 110 lb 9.6 oz (50.2 kg)    LMP 10/31/2016    SpO2 99%    BMI 20.23 kg/m     Wt Readings from Last 3 Encounters:  05/15/21 110 lb 9.6 oz (50.2 kg)  05/02/21 109 lb 9.6 oz (49.7 kg)  01/25/21 106 lb 6.4 oz (48.3 kg)     GEN: Appears very lean and fit, well nourished, well developed in no acute distress HEENT: Normal NECK: No JVD; No carotid bruits LYMPHATICS: No lymphadenopathy CARDIAC: RRR, no murmurs, rubs, gallops RESPIRATORY:  Clear to auscultation without rales, wheezing or rhonchi  ABDOMEN: Soft, non-tender, non-distended MUSCULOSKELETAL:  No edema; No deformity  SKIN: Warm and dry NEUROLOGIC:  Alert and oriented x 3 PSYCHIATRIC:  Normal affect   ASSESSMENT:    1. Precordial pain   2. Palpitations   3. Family history of hyperlipidemia    PLAN:    In order of problems listed above:  Chest pain: All told, this sounds much more compatible with esophageal etiology, not compatible with angina pectoris.  She has a relatively recent normal treadmill stress test and a normal resting ECG today.  Has very limited to coronary risk factors.  Repeat testing does not appear to be indicated at this time.  Suggested follow-up with her gastroenterology specialist. Elevation in blood pressure: Still largely in normal range.  Advised to increase physical activity, finding a way to reduce the salt intake that is habitual in  her home, relaxation exercises.  I do not think she requires medications. Palpitations: Sounds like she is describing isolated  PACs or PVCs, but we will check an arrhythmia monitor. Family history of hyperlipidemia: Recommend coronary calcium score.           Medication Adjustments/Labs and Tests Ordered: Current medicines are reviewed at length with the patient today.  Concerns regarding medicines are outlined above.  Orders Placed This Encounter  Procedures   CT CARDIAC SCORING (SELF PAY ONLY)   LONG TERM MONITOR (3-14 DAYS)   EKG 12-Lead   No orders of the defined types were placed in this encounter.   Patient Instructions  Medication Instructions:  No changes *If you need a refill on your cardiac medications before your next appointment, please call your pharmacy*   Lab Work: None ordered If you have labs (blood work) drawn today and your tests are completely normal, you will receive your results only by: [place] (if you have MyChart) OR A paper copy in the mail If you have any lab test that is abnormal or we need to change your treatment, we will call you to review the results.   Testing/Procedures:  Dr. Sallyanne Kuster has ordered a CT coronary calcium score.   Test locations:  [facility] ([address] [place], [place] [address]) [facility] ([address] [place],  [address])   This is $99 out of pocket.   Coronary CalciumScan A coronary calcium scan is an imaging test used to look for deposits of calcium and other fatty materials (plaques) in the inner lining of the blood vessels of the heart (coronary arteries). These deposits of calcium and plaques can partly clog and narrow the coronary arteries without producing any symptoms or warning signs. This puts a person at risk for a heart attack. This test can detect these deposits before symptoms develop. Tell a health care provider about: Any allergies you have. All medicines you  are taking, including vitamins, herbs, eye drops, creams, and over-the-counter medicines. Any problems you or family members have had with anesthetic medicines. Any blood disorders you have. Any surgeries you have had. Any medical conditions you have. Whether you are pregnant or may be pregnant. What are the risks? Generally, this is a safe procedure. However, problems may occur, including: Harm to a pregnant woman and her unborn baby. This test involves the use of radiation. Radiation exposure can be dangerous to a pregnant woman and her unborn baby. If you are pregnant, you generally should not have this procedure done. Slight increase in the risk of cancer. This is because of the radiation involved in the test. What happens before the procedure? No preparation is needed for this procedure. What happens during the procedure? You will undress and remove any jewelry around your neck or chest. You will put on a hospital gown. Sticky electrodes will be placed on your chest. The electrodes will be connected to an electrocardiogram (ECG) machine to record a tracing of the electrical activity of your heart. A CT scanner will take pictures of your heart. During this time, you will be asked to lie still and hold your breath for 2-3 seconds while a picture of your heart is being taken. The procedure may vary among health care providers and hospitals. What happens after the procedure? You can get dressed. You can return to your normal activities. It is up to you to get the results of your test. Ask your health care provider, or the department that is doing the test, when your results will be ready. Summary A coronary calcium scan is an imaging test used  to look for deposits of calcium and other fatty materials (plaques) in the inner lining of the blood vessels of the heart (coronary arteries). Generally, this is a safe procedure. Tell your health care provider if you are pregnant or may be  pregnant. No preparation is needed for this procedure. A CT scanner will take pictures of your heart. You can return to your normal activities after the scan is done. This information is not intended to replace advice given to you by your health care provider. Make sure you discuss any questions you have with your health care provider. Document Released: 09/06/2007 Document Revised: 01/28/2016 Document Reviewed: 01/28/2016 Elsevier Interactive Patient Education  2017 [place] Term Monitor Instructions  Your physician has requested you wear a ZIO patch monitor for 14 days.  This is a single patch monitor. [employer] supplies one patch monitor per enrollment. Additional stickers are not available. Please do not apply patch if you will be having a Nuclear Stress Test,  Echocardiogram, Cardiac CT, MRI, or Chest Xray during the period you would be wearing the  monitor. The patch cannot be worn during these tests. You cannot remove and re-apply the  ZIO XT patch monitor.  Your ZIO patch monitor will be mailed 3 day USPS to your address on file. It may take 3-5 days  to receive your monitor after you have been enrolled.  Once you have received your monitor, please review the enclosed instructions. Your monitor  has already been registered assigning a specific monitor serial # to you.  Billing and Patient Assistance Program Information  We have supplied [employer] with any of your insurance information on file for billing purposes. [employer] offers a sliding scale Patient Assistance Program for patients that do not have  insurance, or whose insurance does not completely cover the cost of the ZIO monitor.  You must apply for the Patient Assistance Program to qualify for this discounted rate.  To apply, please call [employer] at [phone], select option 4, select option 2, ask to apply for  Patient Assistance Program. Theodore Demark will ask your household income, and how many people  are in  your household. They will quote your out-of-pocket cost based on that information.  [employer] will also be able to set up a 5-month, interest-free payment plan if needed.  Applying the monitor   Shave hair from upper left chest.  Hold abrader disc by orange tab. Rub abrader in 40 strokes over the upper left chest as  indicated in your monitor instructions.  Clean area with 4 enclosed alcohol pads. Let dry.  Apply patch as indicated in monitor instructions. Patch will be placed under collarbone on left  side of chest with arrow pointing upward.  Rub patch adhesive wings for 2 minutes. Remove white label marked "1". Remove the white  label marked "2". Rub patch adhesive wings for 2 additional minutes.  While looking in a mirror, press and release button in center of patch. A small green light will  flash 3-4 times. This will be your only indicator that the monitor has been turned on.  Do not shower for the first 24 hours. You may shower after the first 24 hours.  Press the button if you feel a symptom. You will hear a small click. Record Date, Time and  Symptom in the Patient Logbook.  When you are ready to remove the patch, follow instructions on the last 2 pages of Patient  Logbook. Stick patch monitor onto the last  page of Patient Logbook.  Place Patient Logbook in the blue and white box. Use locking tab on box and tape box closed  securely. The blue and white box has prepaid postage on it. Please place it in the mailbox as  soon as possible. Your physician should have your test results approximately 7 days after the  monitor has been mailed back to [facility].  Call [place] at [phone] if you have questions regarding  your ZIO XT patch monitor. Call them immediately if you see an orange light blinking on your  monitor.  If your monitor falls off in less than 4 days, contact our Monitor department at [phone].  If your monitor becomes loose or falls off  after 4 days call [employer] at [phone] for  suggestions on securing your monitor    Follow-Up: At [facility], you and your health needs are our priority.  As part of our continuing mission to provide you with exceptional heart care, we have created designated Provider Care Teams.  These Care Teams include your primary Cardiologist (physician) and Advanced Practice Providers (APPs -  Physician Assistants and Nurse Practitioners) who all work together to provide you with the care you need, when you need it.  We recommend signing up for the patient portal called "MyChart".  Sign up information is provided on this After Visit Summary.  MyChart is used to connect with patients for Virtual Visits (Telemedicine).  Patients are able to view lab/test results, encounter notes, upcoming appointments, etc.  Non-urgent messages can be sent to your provider as well.   To learn more about what you can do with MyChart, go to [email or web address].    Your next appointment:   Follow up as needed with Dr. Sallyanne Kuster    Signed, Sanda Klein, MD  05/15/2021 4:33 PM    [place]

## 2021-05-15 NOTE — Progress Notes (Unsigned)
Enrolled patient for a 14 day Zio XT  monitor to be mailed to patients home  °

## 2021-05-19 DIAGNOSIS — R002 Palpitations: Secondary | ICD-10-CM | POA: Diagnosis not present

## 2021-07-01 ENCOUNTER — Ambulatory Visit (INDEPENDENT_AMBULATORY_CARE_PROVIDER_SITE_OTHER)
Admission: RE | Admit: 2021-07-01 | Discharge: 2021-07-01 | Disposition: A | Discharge status: Home / Self Care | Payer: Self-pay | Source: Ambulatory Visit | Attending: Cardiovascular Disease | Admitting: Cardiovascular Disease

## 2021-07-01 DIAGNOSIS — Z83438 Family history of other disorder of lipoprotein metabolism and other lipidemia: Secondary | ICD-10-CM

## 2021-09-25 ENCOUNTER — Telehealth: Payer: Self-pay | Admitting: Internal Medicine

## 2021-09-25 ENCOUNTER — Other Ambulatory Visit: Payer: Self-pay

## 2021-09-25 MED ORDER — PANTOPRAZOLE SODIUM 40 MG PO TBEC: 40.0000 mg | DELAYED_RELEASE_TABLET | Freq: Every day | ORAL | 1 refills | Status: DC

## 2021-09-25 NOTE — Telephone Encounter (Signed)
Inbound call from patient stating pharmacy will be sending authorization form for refill for Pantoprazole medication. Please give patient a call to advise if neccessary.  Thank you

## 2021-09-25 NOTE — Progress Notes (Signed)
Refill sent to pharmacy with note that Patient needs to call to schedule a yearly follow up. Last seen 08-2020

## 2021-09-25 NOTE — Telephone Encounter (Signed)
Refill sent to pharmacy with note that Patient needs to call to schedule a yearly follow up. Last seen 08-2020

## 2021-11-27 ENCOUNTER — Encounter: Payer: Self-pay | Admitting: Internal Medicine

## 2021-12-03 NOTE — Telephone Encounter (Signed)
Printed form given to Dr. Redmond School

## 2021-12-18 ENCOUNTER — Other Ambulatory Visit: Payer: Self-pay | Admitting: Medical

## 2021-12-18 MED ORDER — FLUTICASONE PROPIONATE HFA 220 MCG/ACT IN AERO: 2.0000 | INHALATION_SPRAY | RESPIRATORY_TRACT | 1 refills | Status: AC | PRN

## 2021-12-31 ENCOUNTER — Encounter: Payer: Self-pay | Admitting: Internal Medicine

## 2022-01-27 ENCOUNTER — Ambulatory Visit (INDEPENDENT_AMBULATORY_CARE_PROVIDER_SITE_OTHER): Payer: 59 | Admitting: Medical

## 2022-01-27 VITALS — BP 110/64 | HR 65 | Ht 62.0 in | Wt 102.0 lb

## 2022-01-27 DIAGNOSIS — R002 Palpitations: Secondary | ICD-10-CM

## 2022-01-27 DIAGNOSIS — E559 Vitamin D deficiency, unspecified: Secondary | ICD-10-CM

## 2022-01-27 DIAGNOSIS — Z7185 Encounter for immunization safety counseling: Secondary | ICD-10-CM | POA: Diagnosis not present

## 2022-01-27 DIAGNOSIS — K219 Gastro-esophageal reflux disease without esophagitis: Secondary | ICD-10-CM | POA: Diagnosis not present

## 2022-01-27 DIAGNOSIS — Z0000 Encounter for general adult medical examination without abnormal findings: Secondary | ICD-10-CM | POA: Diagnosis not present

## 2022-01-27 DIAGNOSIS — F419 Anxiety disorder, unspecified: Secondary | ICD-10-CM

## 2022-01-27 DIAGNOSIS — Z87442 Personal history of urinary calculi: Secondary | ICD-10-CM

## 2022-01-27 DIAGNOSIS — J4530 Mild persistent asthma, uncomplicated: Secondary | ICD-10-CM | POA: Diagnosis not present

## 2022-01-27 DIAGNOSIS — K589 Irritable bowel syndrome without diarrhea: Secondary | ICD-10-CM

## 2022-01-27 DIAGNOSIS — J301 Allergic rhinitis due to pollen: Secondary | ICD-10-CM | POA: Diagnosis not present

## 2022-01-27 DIAGNOSIS — M810 Age-related osteoporosis without current pathological fracture: Secondary | ICD-10-CM

## 2022-01-27 DIAGNOSIS — Z8249 Family history of ischemic heart disease and other diseases of the circulatory system: Secondary | ICD-10-CM

## 2022-01-27 DIAGNOSIS — Z1329 Encounter for screening for other suspected endocrine disorder: Secondary | ICD-10-CM

## 2022-01-27 DIAGNOSIS — Z13220 Encounter for screening for lipoid disorders: Secondary | ICD-10-CM

## 2022-01-27 DIAGNOSIS — M4125 Other idiopathic scoliosis, thoracolumbar region: Secondary | ICD-10-CM

## 2022-01-27 MED ORDER — METHOCARBAMOL 500 MG PO TABS: 500.0000 mg | ORAL_TABLET | Freq: Two times a day (BID) | ORAL | 2 refills | Status: AC | PRN

## 2022-01-27 MED ORDER — LORAZEPAM 0.5 MG PO TABS: 0.5000 mg | ORAL_TABLET | Freq: Two times a day (BID) | ORAL | 1 refills | Status: DC | PRN

## 2022-01-27 NOTE — Addendum Note (Signed)
Addended by: Minette Headland A on: 01/27/2022 11:11 AM   Modules accepted: Orders

## 2022-01-27 NOTE — Progress Notes (Signed)
Subjective:   HPI  [name] is a 61 y.o. female who presents for Chief Complaint  Patient presents with   fasting cpe    Fasitng cpe, no concerns, obgyn- Dr. Alfred Levins, would like covid shot today    Patient Care Team: Rameen Gohlke, Camelia Eng, PA-C as PCP - General (Family Medicine) Croitoru, Dani Gobble, MD as PCP - Cardiology (Cardiology) Royston Sinner Colin Benton, MD as Consulting Physician (Obstetrics and Gynecology) Hilarie Fredrickson, Lajuan Lines, MD as Consulting Physician (Gastroenterology) Kennith Gain, MD as Consulting Physician (Allergy) Festus Aloe, MD as Consulting Physician (Urology) Dermatology Sees dentist:Dr. Max Fickle eye doctor: Melanee Spry   Concerns: She has palpitations every now and then.  No specific triggers.  Short-lived but still curious why this happens.  Her normal rate is in the 60s which she will see her pulse go up to 80 or 100 on her smart watch without any particular symptoms.  She had similar last year.  She sees cardiology  Osteoporosis-currently on hormones but getting ready to come off of these.  Prior scan this past year was improved from the prior.  She is doing aerobic and weightbearing exercise.  She is enrolled in a study looking at the effects of exercise on aging and Alzheimer's.  She has no issues with memory but the study includes exercise multiple days per week with weightbearing resistance and aerobic exercise  Asthma-uses inhaler as needed.  No specific recent issues  Every now and then feels off for a weak.  She wonders about whether her blood sugars going low  Reviewed their medical, surgical, family, social, medication, and allergy history and updated chart as appropriate.  Past Medical History:  Diagnosis Date   Allergy    Anxiety    Asthma    Chronic back pain    Depression    Eosinophilic esophagitis    Esophageal spasm    Esophageal stricture    Gastritis    GERD (gastroesophageal reflux disease)    Headache     occasional   IBS (irritable bowel syndrome)    Kidney stone 2015   gets every few years, hx/o calcium oxalate stones   Osteoporosis    Scoliosis    Seasonal allergic conjunctivitis    Seasonal allergic rhinitis    Small intestinal bacterial overgrowth    Vitamin D deficiency    Wears glasses     Past Surgical History:  Procedure Laterality Date   APPENDECTOMY     BREAST EXCISIONAL BIOPSY     left   COLONOSCOPY  2013   [place], [place]   COLONOSCOPY  08/2018   TA, diverticula   ESOPHAGEAL DILATION     several times prior, [place], [place]   ESOPHAGOPLASTY  03/6107   complication of esophageal dilitation, hospitalization at [place] Right 12/31/2017   Procedure: EXCISION RIGHT WRIST DORSAL GANGLION;  Surgeon: Leanora Cover, MD;  Location: [place];  Service: Orthopedics;  Laterality: Right;  Bier block   INCISION AND DRAINAGE ABSCESS Bilateral 09/14/2015   Procedure: INCISION AND DRAINAGE ABSCESS;  Surgeon: Leanora Cover, MD;  Location: [place];  Service: Orthopedics;  Laterality: Bilateral;   KIDNEY STONE SURGERY  1986   LITHOTRIPSY     4x prior as of 05/2014      Family History  Problem Relation Age of Onset   Barrett's esophagus Mother    Diabetes Father    Kidney Stones Father    Hypertension Father  Heart attack Father    Heart disease Father 32       MI , stent   Diabetes Sister    Kidney Stones Brother    Migraines Brother    Urolithiasis Brother    Diabetes Brother    Headache Brother    Heart disease Maternal Grandfather        CABG   Barrett's esophagus Paternal Grandfather    Heart disease Maternal Grandmother 66       rheumatic related heart disesae   Cancer Neg Hx    Stroke Neg Hx    Colon cancer Neg Hx    Esophageal cancer Neg Hx    Stomach cancer Neg Hx    Rectal cancer Neg Hx      Current Outpatient Medications:    AMBULATORY NON FORMULARY MEDICATION, Budesonide slurry '2mg'$ /70m Sig:  5cc daily x 8 weeks, Disp: 300 mL, Rfl: 2   betamethasone dipropionate 0.05 % cream, Apply topically 2 (two) times daily as needed., Disp: , Rfl:    calcium carbonate (CALCIUM 600) 600 MG TABS tablet, Take 1 tablet (600 mg total) by mouth 2 (two) times daily with a meal. (Patient taking differently: Take 600 mg by mouth daily with breakfast.), Disp: 180 tablet, Rfl: 3   cholecalciferol (VITAMIN D3) 25 MCG (1000 UT) tablet, Take 2 tablets (2,000 Units total) by mouth daily., Disp: 180 tablet, Rfl: 3   diclofenac (VOLTAREN) 75 MG EC tablet, Take 1 tablet (75 mg total) by mouth 2 (two) times daily., Disp: 45 tablet, Rfl: 2   esomeprazole (NEXIUM) 20 MG packet, Take 20 mg by mouth daily before breakfast., Disp: , Rfl:    estradiol (VIVELLE-DOT) 0.025 MG/24HR, Place 1 patch onto the skin 2 (two) times a week. , Disp: , Rfl:    fluticasone (FLOVENT HFA) 220 MCG/ACT inhaler, Inhale 2 puffs into the lungs as needed. Inhale two puffs one to two times daily to prevent cough or wheeze. Rinse, gargle, and spit after use., Disp: 1 each, Rfl: 1   progesterone (PROMETRIUM) 200 MG capsule, Take 200 mg by mouth daily. , Disp: , Rfl:    VENTOLIN HFA 108 (90 Base) MCG/ACT inhaler, TAKE 2 PUFFS BY MOUTH EVERY 6 HOURS AS NEEDED FOR WHEEZE OR SHORTNESS OF BREATH, Disp: 18 g, Rfl: 1   fluticasone (FLONASE) 50 MCG/ACT nasal spray, Place 2 sprays into both nostrils daily. (Patient not taking: Reported on 01/27/2022), Disp: 16 g, Rfl: 5   LORazepam (ATIVAN) 0.5 MG tablet, Take 1 tablet (0.5 mg total) by mouth 2 (two) times daily as needed for anxiety., Disp: 60 tablet, Rfl: 1   methocarbamol (ROBAXIN) 500 MG tablet, Take 1 tablet (500 mg total) by mouth 2 (two) times daily as needed (back pain)., Disp: 45 tablet, Rfl: 2  No Known Allergies   Review of Systems Constitutional: -fever, -chills, -sweats, -unexpected weight change, -decreased appetite, -fatigue Allergy: -sneezing, -itching, -congestion Dermatology: -changing  moles, --rash, -lumps ENT: -runny nose, -ear pain, -sore throat, -hoarseness, -sinus pain, -teeth pain, - ringing in ears, -hearing loss, -nosebleeds Cardiology: -chest pain, +palpitations, -swelling, -difficulty breathing when lying flat, -waking up short of breath Respiratory: -cough, -shortness of breath, -difficulty breathing with exercise or exertion, -wheezing, -coughing up blood Gastroenterology: -abdominal pain, -nausea, -vomiting, -diarrhea, -constipation, -blood in stool, -changes in bowel movement, -difficulty swallowing or eating Hematology: -bleeding, -bruising  Musculoskeletal: -joint aches, -muscle aches, -joint swelling, -back pain, -neck pain, -cramping, -changes in gait Ophthalmology: denies vision changes, eye redness, itching, discharge  Urology: -burning with urination, -difficulty urinating, -blood in urine, -urinary frequency, -urgency, -incontinence Neurology: -headache, -weakness, -tingling, -numbness, -memory loss, -falls, -dizziness Psychology: -depressed mood, -agitation, -sleep problems Breast/gyn: -breast tendnerss, -discharge, -lumps, -vaginal discharge,- irregular periods, -heavy periods       Objective:  BP 110/64   Pulse 65   Ht '5\' 2"'$  (1.575 m)   Wt 102 lb (46.3 kg)   LMP 10/31/2016   BMI 18.66 kg/m   Wt Readings from Last 3 Encounters:  01/27/22 102 lb (46.3 kg)  05/15/21 110 lb 9.6 oz (50.2 kg)  05/02/21 109 lb 9.6 oz (49.7 kg)     General appearence: alert, no distress, WD/WN, lean white female Skin: Scattered macules, no worrisome lesions  HEENT: normocephalic, sclerae anicteric, PERRLA, EOMi, nares patent, no discharge or erythema, pharynx normal Oral cavity: MMM, no lesions Neck: supple, no lymphadenopathy, no thyromegaly, no masses Heart: RRR, normal S1, S2, no murmurs Lungs: CTA bilaterally, no wheezes, rhonchi, or rales Abdomen: +bs, soft, non tender, non distended, no masses, no hepatomegaly, no splenomegaly Back: non tender, scoliosis  noted Musculoskeletal: nontender, no swelling, positive scoliosis Extremities: no edema, no cyanosis, no clubbing Pulses: 2+ symmetric, upper and lower extremities, normal cap refill Neurological: alert, oriented x 3, CN2-12 intact, strength normal upper extremities and lower extremities, sensation normal throughout, DTRs 2+ throughout, no cerebellar signs, gait normal Psychiatric: normal affect, behavior normal, pleasant  Breast, GYN, rectal-deferred to gynecology     Assessment and Plan :   Encounter Diagnoses  Name Primary?   Routine general medical examination at a health care facility Yes   Mild persistent asthma without complication    Allergic rhinitis due to pollen, unspecified seasonality    Gastroesophageal reflux disease without esophagitis    Irritable bowel syndrome, unspecified type    Vaccine counseling    Vitamin D deficiency    Screening for lipid disorders    Screening for thyroid disorder    Palpitation    History of renal calculi    Family history of heart disease    Anxiety    Osteoporosis, unspecified osteoporosis type, unspecified pathological fracture presence    Other idiopathic scoliosis, thoracolumbar region       Physical exam - discussed and counseled on healthy lifestyle, diet, exercise, preventative care, vaccinations, sick and well care, proper use of emergency dept and after hours care, and addressed their concerns.    Health screening: Advised they see their eye doctor yearly for routine vision care. Advised they see their dentist yearly for routine dental care including hygiene visits twice yearly. See your gynecologist yearly for routine gynecological care.  Cancer screening Counseled on self breast exams, mammograms, cervical cancer screening  Colonoscopy:  Reviewed colonoscopy on file that is up to date 08/2018  Reviewed normal mammogram 11/2020, normal pap 11/2019  Discussed routine skin surveillance   Vaccinations: She had flu  vaccine at work recently  Discussed COVID-vaccine and this was updated today  Consider shingles vaccine in the near future    Separate significant issues discussed: Palpitation - .  Labs today. Limit caffeine.  Consider updated event monitor testing or follow-up with cardiology  Asthma and allergies-continue current medication, managed by specialist.    Vitamin D deficiency -continue supplement, counseled on diet, sun exposure  Anxiety, concentration deficit-uses lorazepam as needed.  GERD-discussed risk and benefits of medicine, currently on lower dose PPI over-the-counter.  Discussed risks/benefits of medication  Osteoporosis- 08/2018 bone density test shows osteoporosis.she notes improvement on recent 2022  test through gynecology.  Refuses bisphosphonates, prolia and other typical medications. Advised weightbearing exercise, aerobic exercise, continue vitamin D and calcium, on hormone therapy per gynecology.    CT coronary calcium score test with a score of zero 07/01/21  IBS-no recent concerns.  Sees gastroenterology    Aryanah was seen today for fasting cpe.  Diagnoses and all orders for this visit:  Routine general medical examination at a health care facility -     Comprehensive metabolic panel -     CBC with Differential/Platelet -     Lipid panel -     VITAMIN D 25 Hydroxy (Vit-D Deficiency, Fractures) -     TSH + free T4 -     Cortisol  Mild persistent asthma without complication  Allergic rhinitis due to pollen, unspecified seasonality  Gastroesophageal reflux disease without esophagitis  Irritable bowel syndrome, unspecified type  Vaccine counseling  Vitamin D deficiency -     VITAMIN D 25 Hydroxy (Vit-D Deficiency, Fractures)  Screening for lipid disorders -     Lipid panel  Screening for thyroid disorder -     TSH + free T4  Palpitation -     TSH + free T4 -     Cortisol  History of renal calculi  Family history of heart  disease  Anxiety  Osteoporosis, unspecified osteoporosis type, unspecified pathological fracture presence  Other idiopathic scoliosis, thoracolumbar region  Other orders -     LORazepam (ATIVAN) 0.5 MG tablet; Take 1 tablet (0.5 mg total) by mouth 2 (two) times daily as needed for anxiety. -     methocarbamol (ROBAXIN) 500 MG tablet; Take 1 tablet (500 mg total) by mouth 2 (two) times daily as needed (back pain).    Follow-up pending labs, yearly for physical

## 2022-01-28 LAB — COMPREHENSIVE METABOLIC PANEL
ALT: 12 IU/L (ref 0–32)
AST: 17 IU/L (ref 0–40)
Albumin/Globulin Ratio: 2.4 — ABNORMAL HIGH (ref 1.2–2.2)
Albumin: 4.7 g/dL (ref 3.8–4.9)
Alkaline Phosphatase: 72 IU/L (ref 44–121)
BUN/Creatinine Ratio: 18 (ref 9–23)
BUN: 15 mg/dL (ref 6–24)
Bilirubin Total: 0.5 mg/dL (ref 0.0–1.2)
CO2: 24 mmol/L (ref 20–29)
Calcium: 10 mg/dL (ref 8.7–10.2)
Chloride: 102 mmol/L (ref 96–106)
Creatinine, Ser: 0.85 mg/dL (ref 0.57–1.00)
Globulin, Total: 2 g/dL (ref 1.5–4.5)
Glucose: 92 mg/dL (ref 70–99)
Potassium: 4.5 mmol/L (ref 3.5–5.2)
Sodium: 139 mmol/L (ref 134–144)
Total Protein: 6.7 g/dL (ref 6.0–8.5)
eGFR: 79 mL/min/{1.73_m2} (ref >59)

## 2022-01-28 LAB — CBC WITH DIFFERENTIAL/PLATELET
Basophils Absolute: 0.1 10*3/uL (ref 0.0–0.2)
Basos: 1 %
EOS (ABSOLUTE): 0.2 10*3/uL (ref 0.0–0.4)
Eos: 5 %
Hematocrit: 38 % (ref 34.0–46.6)
Hemoglobin: 12.8 g/dL (ref 11.1–15.9)
Immature Grans (Abs): 0 10*3/uL (ref 0.0–0.1)
Immature Granulocytes: 0 %
Lymphocytes Absolute: 1.5 10*3/uL (ref 0.7–3.1)
Lymphs: 33 %
MCH: 31.4 pg (ref 26.6–33.0)
MCHC: 33.7 g/dL (ref 31.5–35.7)
MCV: 93 fL (ref 79–97)
Monocytes Absolute: 0.3 10*3/uL (ref 0.1–0.9)
Monocytes: 7 %
Neutrophils Absolute: 2.6 10*3/uL (ref 1.4–7.0)
Neutrophils: 54 %
Platelets: 351 10*3/uL (ref 150–450)
RBC: 4.07 x10E6/uL (ref 3.77–5.28)
RDW: 11.6 % — ABNORMAL LOW (ref 11.7–15.4)
WBC: 4.7 10*3/uL (ref 3.4–10.8)

## 2022-01-28 LAB — LIPID PANEL
Chol/HDL Ratio: 2.5 ratio (ref 0.0–4.4)
Cholesterol, Total: 187 mg/dL (ref 100–199)
HDL: 74 mg/dL (ref >39)
LDL Chol Calc (NIH): 101 mg/dL — ABNORMAL HIGH (ref 0–99)
Triglycerides: 62 mg/dL (ref 0–149)
VLDL Cholesterol Cal: 12 mg/dL (ref 5–40)

## 2022-01-28 LAB — CORTISOL: Cortisol: 7.9 ug/dL (ref 6.2–19.4)

## 2022-01-28 LAB — TSH+FREE T4
Free T4: 1.42 ng/dL (ref 0.82–1.77)
TSH: 1.18 u[IU]/mL (ref 0.450–4.500)

## 2022-01-28 LAB — VITAMIN D 25 HYDROXY (VIT D DEFICIENCY, FRACTURES): Vit D, 25-Hydroxy: 87.9 ng/mL (ref 30.0–100.0)

## 2022-03-26 DIAGNOSIS — Z1283 Encounter for screening for malignant neoplasm of skin: Secondary | ICD-10-CM | POA: Diagnosis not present

## 2022-03-26 DIAGNOSIS — L308 Other specified dermatitis: Secondary | ICD-10-CM | POA: Diagnosis not present

## 2022-03-26 DIAGNOSIS — D225 Melanocytic nevi of trunk: Secondary | ICD-10-CM | POA: Diagnosis not present

## 2022-03-27 ENCOUNTER — Ambulatory Visit: Payer: BC Managed Care – PPO | Admitting: Physician Assistant

## 2022-03-27 ENCOUNTER — Encounter: Payer: Self-pay | Admitting: Physician Assistant

## 2022-03-27 VITALS — HR 63 | Ht 62.0 in | Wt 104.0 lb

## 2022-03-27 DIAGNOSIS — K200 Eosinophilic esophagitis: Secondary | ICD-10-CM

## 2022-03-27 DIAGNOSIS — K219 Gastro-esophageal reflux disease without esophagitis: Secondary | ICD-10-CM

## 2022-03-27 DIAGNOSIS — R1030 Lower abdominal pain, unspecified: Secondary | ICD-10-CM

## 2022-03-27 MED ORDER — AMBULATORY NON FORMULARY MEDICATION: 2 refills | Status: DC

## 2022-03-27 MED ORDER — ESOMEPRAZOLE MAGNESIUM 20 MG PO PACK: 20.0000 mg | PACK | Freq: Every day | ORAL | 3 refills | Status: DC

## 2022-03-27 MED ORDER — HYOSCYAMINE SULFATE 0.125 MG SL SUBL: 0.1250 mg | SUBLINGUAL_TABLET | SUBLINGUAL | 3 refills | Status: DC | PRN

## 2022-03-27 NOTE — Patient Instructions (Addendum)
We have sent the following medications to your pharmacy for you to pick up at your convenience: Omeprazole, Hyoscyamine - ([facility] )  Budesonide Slurry ( [place])   A high fiber diet with plenty of fluids (up to 8 glasses of water daily) is suggested to relieve these symptoms.  Benefiber- 1 tablespoon  twice daily can be used to keep bowels regular if needed.   Follow up on: 05/08/22 at 10:00 am with Ellouise Newer PA-C  Due to recent changes in healthcare laws, you may see the results of your imaging and laboratory studies on MyChart before your provider has had a chance to review them.  We understand that in some cases there may be results that are confusing or concerning to you. Not all laboratory results come back in the same time frame and the provider may be waiting for multiple results in order to interpret others.  Please give [place] 48 hours in order for your provider to thoroughly review all the results before contacting the office for clarification of your results.   Thank you for choosing me and [facility].  Nolberto Hanlon PA-C

## 2022-03-27 NOTE — Progress Notes (Signed)
Chief Complaint: Abdominal Pain  HPI:    [name] is a  62 y/o female with a past medical history as listed below including GERD, esophageal stricture, SIBO, esophageal spasm, chronic back pain, depression, anxiety, IBS and multiple others, known to Dr. Hilarie Fredrickson, who presents to clinic today with a complaint of abdominal pain.      2015 EGD in [facility] with biopsies consistent with eosinophilic esophagitis with abundant eosinophilic infiltrates greater than 100 per high-power field and eosinophilic microabscesses, savory dilated empirically for complaints of dysphagia and had a nonobstructive stricture.  Also noted to have small white plaques in the esophagus.    11/2016 EGD normal with biopsies showing no evidence of eosinophilic esophagitis, gastric biopsies with mild chronic gastritis, no H. pylori and small bowel biopsies were negative.    08/2018 colonoscopy with 2 small tubular adenomas otherwise normal.  Repeat recommended in 7 years.    08/23/2020 seen by Nicoletta Ba for epigastric pain and regurgitation as well as dysphagia.  At that time suspected to have exacerbation of eosinophilic esophagitis.  At that time Pantoprazole increased to 40 mg twice a day for 4 weeks then decrease to 1 p.o. every morning if symptoms were better.  Started Budesonide slurry 2 mg per 10 mL - 5 cc twice daily, changed to once daily dosing due to patient not being able to sleep if you take it at night.  Discussed EGD if symptoms persisted.    01/27/2022 CBC, CMP, TSH and free T4 as well as cortisol normal.    Today, patient describes that she has been having increased esophageal pain since Christmas and would like a round of Budesonide slurry as this always helps this go away.  This is not her primary concern though.  Mostly she would like to discuss a lower abdominal pain which she has been having since July of last year.  Initially when this presented she went to her urologist as she thought it was her bladder and  they did a cystoscopy and a full workup and told her it was not them.  It then eased off but then flared again a couple of months ago when she went to see her OB/GYN who told her it was nothing to do with them.  This is why she is here now.  Tells me that this pain has been coming and going since then and she cannot tell exactly what brings it on.  She thinks maybe having a bowel movement helps a little bit or she takes a Tylenol that helps to knock it out.  It was once every day but now it is slightly less frequent.  Sometimes worse than others in severity but at its worst is a 5/10.  Describes it as a dull pain.  Associated symptoms include incomplete evacuation and fecal smearing.    Denies fever, chills, weight loss, nausea or vomiting.  Past Medical History:  Diagnosis Date   Allergy    Anxiety    Asthma    Chronic back pain    Depression    Eosinophilic esophagitis    Esophageal spasm    Esophageal stricture    Gastritis    GERD (gastroesophageal reflux disease)    Headache    occasional   IBS (irritable bowel syndrome)    Kidney stone 2015   gets every few years, hx/o calcium oxalate stones   Osteoporosis    Scoliosis    Seasonal allergic conjunctivitis    Seasonal allergic rhinitis  Small intestinal bacterial overgrowth    Vitamin D deficiency    Wears glasses     Past Surgical History:  Procedure Laterality Date   APPENDECTOMY     BREAST EXCISIONAL BIOPSY     left   COLONOSCOPY  2013   [place], [place]   COLONOSCOPY  08/2018   TA, diverticula   ESOPHAGEAL DILATION     several times prior, [place], [place]   ESOPHAGOPLASTY  03/2456   complication of esophageal dilitation, hospitalization at [place] Right 12/31/2017   Procedure: EXCISION RIGHT WRIST DORSAL GANGLION;  Surgeon: Leanora Cover, MD;  Location: [place];  Service: Orthopedics;  Laterality: Right;  Bier block   INCISION AND DRAINAGE ABSCESS Bilateral 09/14/2015    Procedure: INCISION AND DRAINAGE ABSCESS;  Surgeon: Leanora Cover, MD;  Location: [place];  Service: Orthopedics;  Laterality: Bilateral;   KIDNEY STONE SURGERY  1986   LITHOTRIPSY     4x prior as of 05/2014    Current Outpatient Medications  Medication Sig Dispense Refill   AMBULATORY NON FORMULARY MEDICATION Budesonide slurry '2mg'$ /28m Sig: 5cc daily x 8 weeks 300 mL 2   betamethasone dipropionate 0.05 % cream Apply topically 2 (two) times daily as needed.     calcium carbonate (CALCIUM 600) 600 MG TABS tablet Take 1 tablet (600 mg total) by mouth 2 (two) times daily with a meal. (Patient taking differently: Take 600 mg by mouth daily with breakfast.) 180 tablet 3   cholecalciferol (VITAMIN D3) 25 MCG (1000 UT) tablet Take 2 tablets (2,000 Units total) by mouth daily. 180 tablet 3   diclofenac (VOLTAREN) 75 MG EC tablet Take 1 tablet (75 mg total) by mouth 2 (two) times daily. 45 tablet 2   esomeprazole (NEXIUM) 20 MG packet Take 20 mg by mouth daily before breakfast.     estradiol (VIVELLE-DOT) 0.025 MG/24HR Place 1 patch onto the skin 2 (two) times a week.      fluticasone (FLONASE) 50 MCG/ACT nasal spray Place 2 sprays into both nostrils daily. (Patient not taking: Reported on 01/27/2022) 16 g 5   fluticasone (FLOVENT HFA) 220 MCG/ACT inhaler Inhale 2 puffs into the lungs as needed. Inhale two puffs one to two times daily to prevent cough or wheeze. Rinse, gargle, and spit after use. 1 each 1   LORazepam (ATIVAN) 0.5 MG tablet Take 1 tablet (0.5 mg total) by mouth 2 (two) times daily as needed for anxiety. 60 tablet 1   methocarbamol (ROBAXIN) 500 MG tablet Take 1 tablet (500 mg total) by mouth 2 (two) times daily as needed (back pain). 45 tablet 2   progesterone (PROMETRIUM) 200 MG capsule Take 200 mg by mouth daily.      VENTOLIN HFA 108 (90 Base) MCG/ACT inhaler TAKE 2 PUFFS BY MOUTH EVERY 6 HOURS AS NEEDED FOR WHEEZE OR SHORTNESS OF BREATH 18 g 1   No current  facility-administered medications for this visit.    Allergies as of 03/27/2022   (No Known Allergies)    Family History  Problem Relation Age of Onset   Barrett's esophagus Mother    Diabetes Father    Kidney Stones Father    Hypertension Father    Heart attack Father    Heart disease Father 762      MI , stent   Diabetes Sister    Kidney Stones Brother    Migraines Brother    Urolithiasis Brother  Diabetes Brother    Headache Brother    Heart disease Maternal Grandfather        CABG   Barrett's esophagus Paternal Grandfather    Heart disease Maternal Grandmother 66       rheumatic related heart disesae   Cancer Neg Hx    Stroke Neg Hx    Colon cancer Neg Hx    Esophageal cancer Neg Hx    Stomach cancer Neg Hx    Rectal cancer Neg Hx     Social History   Socioeconomic History   Marital status: Single    Spouse name: Not on file   Number of children: 0   Years of education: Not on file   Highest education level: Not on file  Occupational History   Occupation: Tax adviser    Comment: Midwife  Tobacco Use   Smoking status: Never   Smokeless tobacco: Never  Vaping Use   Vaping Use: Never used  Substance and Sexual Activity   Alcohol use: Yes    Alcohol/week: 4.0 standard drinks of alcohol    Types: 4 Glasses of wine per week   Drug use: No   Sexual activity: Not on file  Other Topics Concern   Not on file  Social History Narrative   2 dogs, muts [place] and [place], exercise 3-4 days per week with walking, Yoga, weights, CPA.  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   01/2020   Social Determinants of Health   Financial Resource Strain: Not on file  Food Insecurity: Not on file  Transportation Needs: Not on file  Physical Activity: Not on file  Stress: Not on file  Social Connections: Not on file  Intimate Partner Violence: Not on file    Review of Systems:    Constitutional: No weight loss, fever or  chills Cardiovascular: No chest pain Respiratory: No SOB  Gastrointestinal: See HPI and otherwise negative    Physical Exam:  Vital signs: Pulse 63   Ht '5\' 2"'$  (1.575 m)   Wt 104 lb (47.2 kg)   LMP 10/31/2016   BMI 19.02 kg/m    Constitutional:   Pleasant Caucasian female appears to be in NAD, Well developed, Well nourished, alert and cooperative Respiratory: Respirations even and unlabored. Lungs clear to auscultation bilaterally.   No wheezes, crackles, or rhonchi.  Cardiovascular: Normal S1, S2. No MRG. Regular rate and rhythm. No peripheral edema, cyanosis or pallor.  Gastrointestinal:  Soft, nondistended, nontender. No rebound or guarding. Normal bowel sounds. No appreciable masses or hepatomegaly. Rectal:  Not performed. Marland Kitchen Psychiatric: Oriented to person, place and time. Demonstrates good judgement and reason without abnormal affect or behaviors.  RELEVANT LABS AND IMAGING: CBC    Component Value Date/Time   WBC 4.7 01/27/2022 0925   WBC 10.0 09/04/2019 1647   RBC 4.07 01/27/2022 0925   RBC 3.92 09/04/2019 1647   HGB 12.8 01/27/2022 0925   HCT 38.0 01/27/2022 0925   PLT 351 01/27/2022 0925   MCV 93 01/27/2022 0925   MCH 31.4 01/27/2022 0925   MCH 32.7 09/04/2019 1647   MCHC 33.7 01/27/2022 0925   MCHC 33.6 09/04/2019 1647   RDW 11.6 (L) 01/27/2022 0925   LYMPHSABS 1.5 01/27/2022 0925   MONOABS 0.7 09/04/2019 1647   EOSABS 0.2 01/27/2022 0925   BASOSABS 0.1 01/27/2022 0925    CMP     Component Value Date/Time   NA 139 01/27/2022 0925   K 4.5 01/27/2022 0925   CL 102 01/27/2022  0925   CO2 24 01/27/2022 0925   GLUCOSE 92 01/27/2022 0925   GLUCOSE 91 09/04/2019 1647   BUN 15 01/27/2022 0925   CREATININE 0.85 01/27/2022 0925   CREATININE 0.74 10/21/2016 0812   CALCIUM 10.0 01/27/2022 0925   PROT 6.7 01/27/2022 0925   ALBUMIN 4.7 01/27/2022 0925   AST 17 01/27/2022 0925   ALT 12 01/27/2022 0925   ALKPHOS 72 01/27/2022 0925   BILITOT 0.5 01/27/2022 0925    GFRNONAA >60 09/04/2019 1647   GFRAA >60 09/04/2019 1647    Assessment: 1.  Lower abdominal pain: With some incomplete evacuation and fecal smearing, normal urology and gynecology checks, bilateral pain that happens once every few days and last for an hour or so rated as a 5/10, previous history of IBS; consider IBS +/- constipation worse musculoskeletal 2.  Esophageal pain: With history of GERD and eosinophilic esophagitis previously treated with Budesonide slurry which relieved pain, likely the same now 3.  GERD: Controlled on Esomeprazole 20 mg daily  Plan: 1.  Recommend the patient start a fiber supplement such as Benefiber 1 tablespoon twice daily over the next couple of months 2.  Prescribed Hyoscyamine sulfate 0.125 mg sublingual tabs every 4-6 hours as needed #15 with 3 refills 3.  Refilled Budesonide slurry 2 mg per 10 mL -5 cc once daily (patient cannot tolerate night dose as it interferes with sleeping) 4.  Refilled Esomeprazole 20 mg daily, #90 with 3 refills 5.  Patient to follow in clinic with me in 6 to 8 weeks.  Ellouise Newer, PA-C [facility] 03/27/2022, 9:02 AM  Cc: Carlena Hurl, PA-C

## 2022-03-28 DIAGNOSIS — F411 Generalized anxiety disorder: Secondary | ICD-10-CM | POA: Diagnosis not present

## 2022-03-28 NOTE — Progress Notes (Signed)
Addendum: Reviewed and agree with assessment and management plan. Livianna Petraglia M, MD  

## 2022-04-15 ENCOUNTER — Telehealth: Payer: Self-pay | Admitting: Physician Assistant

## 2022-04-15 NOTE — Telephone Encounter (Signed)
Patient is calling states her insurance will not cover the Nexium and is asking if she can take Pantoprazole instead as it helped last time and her insurance covered it. Please advise

## 2022-04-16 MED ORDER — PANTOPRAZOLE SODIUM 20 MG PO TBEC: 20.0000 mg | DELAYED_RELEASE_TABLET | Freq: Every day | ORAL | 1 refills | Status: DC

## 2022-04-16 NOTE — Telephone Encounter (Signed)
Returned call to patient. She would like to have prescription for Pantoprazole 20 mg - QD daily sent to pharmacy. Patient states that she has always has issues in the past with insurance covering Nexium. Patient states that Pantoprazole actually works better. Prescription for Pantoprazole has been sent to [facility].

## 2022-04-24 DIAGNOSIS — I83893 Varicose veins of bilateral lower extremities with other complications: Secondary | ICD-10-CM | POA: Diagnosis not present

## 2022-04-29 DIAGNOSIS — F411 Generalized anxiety disorder: Secondary | ICD-10-CM | POA: Diagnosis not present

## 2022-04-29 DIAGNOSIS — M79605 Pain in left leg: Secondary | ICD-10-CM | POA: Diagnosis not present

## 2022-04-29 DIAGNOSIS — M79604 Pain in right leg: Secondary | ICD-10-CM | POA: Diagnosis not present

## 2022-04-29 DIAGNOSIS — M79662 Pain in left lower leg: Secondary | ICD-10-CM | POA: Diagnosis not present

## 2022-04-29 DIAGNOSIS — M79661 Pain in right lower leg: Secondary | ICD-10-CM | POA: Diagnosis not present

## 2022-05-08 ENCOUNTER — Ambulatory Visit: Payer: BC Managed Care – PPO | Admitting: Physician Assistant

## 2022-05-08 ENCOUNTER — Encounter: Payer: Self-pay | Admitting: Physician Assistant

## 2022-05-08 VITALS — BP 104/65 | HR 59 | Ht 62.0 in | Wt 106.0 lb

## 2022-05-08 DIAGNOSIS — K200 Eosinophilic esophagitis: Secondary | ICD-10-CM

## 2022-05-08 DIAGNOSIS — R1030 Lower abdominal pain, unspecified: Secondary | ICD-10-CM | POA: Diagnosis not present

## 2022-05-08 NOTE — Progress Notes (Signed)
Chief Complaint: Follow-up lower abdominal pain  HPI:    [name] is a 62 year old female with past medical history as listed below including GERD, esophageal stricture, SIBO, esophageal spasm, chronic back pain, depression, anxiety, IBS and multiple others, known to Dr. Hilarie Fredrickson, who returns clinic today for follow-up of her abdominal pain.    2015 EGD in [facility] with biopsies consistent with eosinophilic esophagitis with abundant eosinophilic infiltrates greater than 100 per high-power field and eosinophilic microabscesses, savory dilated empirically for complaints of dysphagia and had a nonobstructive stricture.  Also noted to have small white plaques in the esophagus.    11/2016 EGD normal with biopsies showing no evidence of eosinophilic esophagitis, gastric biopsies with mild chronic gastritis, no H. pylori and small bowel biopsies were negative.    08/2018 colonoscopy with 2 small tubular adenomas otherwise normal.  Repeat recommended in 7 years.    08/23/2020 seen by Nicoletta Ba for epigastric pain and regurgitation as well as dysphagia.  At that time suspected to have exacerbation of eosinophilic esophagitis.  At that time Pantoprazole increased to 40 mg twice a day for 4 weeks then decrease to 1 p.o. every morning if symptoms were better.  Started Budesonide slurry 2 mg per 10 mL - 5 cc twice daily, changed to once daily dosing due to patient not being able to sleep if you take it at night.  Discussed EGD if symptoms persisted.    01/27/2022 CBC, CMP, TSH and free T4 as well as cortisol normal.    03/27/2022 patient seen in clinic and discussed that she was having some esophageal pain and wanted a round of Budesonide slurry which it helped her before.  But primarily discussed the lower abdominal pain which she been having for about 6 to 7 months.  She had been worked up by urology and gynecology who could not find a reason for it.  Also some fecal smearing.  At that time discussed that this  could be related to her IBS.  Recommend she start a fiber supplement such as Benefiber 1 tablespoon twice a day and gave her some Hyoscyamine as needed.  Refilled Esomeprazole 20 mg daily and her Budesonide slurry.     Today, the patient tells me that her lower abdominal pain is much less frequent and less severe over the past month.  In fact she has not even needed her Hyoscyamine.  She has started Benefiber 1 tablespoon in the morning and feels like this has helped all of her symptoms even the fecal smearing.  She is currently happy.    Also took her Budesonide slurry and feels like this worked as well.  For esophageal spasms.    Denies fever, chills, weight loss or blood in her stool.  Past Medical History:  Diagnosis Date   Allergy    Anxiety    Asthma    Chronic back pain    Depression    Eosinophilic esophagitis    Esophageal spasm    Esophageal stricture    Gastritis    GERD (gastroesophageal reflux disease)    Headache    occasional   IBS (irritable bowel syndrome)    Kidney stone 2015   gets every few years, hx/o calcium oxalate stones   Osteoporosis    Scoliosis    Seasonal allergic conjunctivitis    Seasonal allergic rhinitis    Small intestinal bacterial overgrowth    Vitamin D deficiency    Wears glasses     Past Surgical History:  Procedure Laterality Date   APPENDECTOMY     BREAST EXCISIONAL BIOPSY     left   COLONOSCOPY  2013   [place]   COLONOSCOPY  08/2018   TA, diverticula   ESOPHAGEAL DILATION     several times prior, [place], [place]   ESOPHAGOPLASTY  0000000   complication of esophageal dilitation, hospitalization at [place] Right 12/31/2017   Procedure: EXCISION RIGHT WRIST DORSAL GANGLION;  Surgeon: Leanora Cover, MD;  Location: [facility];  Service: Orthopedics;  Laterality: Right;  Bier block   INCISION AND DRAINAGE ABSCESS Bilateral 09/14/2015   Procedure: INCISION AND DRAINAGE ABSCESS;  Surgeon:  Leanora Cover, MD;  Location: [facility];  Service: Orthopedics;  Laterality: Bilateral;   KIDNEY STONE SURGERY  1986   LITHOTRIPSY     4x prior as of 05/2014    Current Outpatient Medications  Medication Sig Dispense Refill   AMBULATORY NON FORMULARY MEDICATION Budesonide slurry 27m/10ml Sig: 5cc daily x 8 weeks 300 mL 2   betamethasone dipropionate 0.05 % cream Apply topically 2 (two) times daily as needed.     calcium carbonate (CALCIUM 600) 600 MG TABS tablet Take 1 tablet (600 mg total) by mouth 2 (two) times daily with a meal. (Patient taking differently: Take 600 mg by mouth daily with breakfast.) 180 tablet 3   cholecalciferol (VITAMIN D3) 25 MCG (1000 UT) tablet Take 2 tablets (2,000 Units total) by mouth daily. 180 tablet 3   diclofenac (VOLTAREN) 75 MG EC tablet Take 1 tablet (75 mg total) by mouth 2 (two) times daily. 45 tablet 2   estradiol (VIVELLE-DOT) 0.025 MG/24HR Place 1 patch onto the skin 2 (two) times a week.      fluticasone (FLONASE) 50 MCG/ACT nasal spray Place 2 sprays into both nostrils daily. 16 g 5   fluticasone (FLOVENT HFA) 220 MCG/ACT inhaler Inhale 2 puffs into the lungs as needed. Inhale two puffs one to two times daily to prevent cough or wheeze. Rinse, gargle, and spit after use. 1 each 1   hyoscyamine (LEVSIN SL) 0.125 MG SL tablet Place 1 tablet (0.125 mg total) under the tongue every 4 (four) hours as needed. 15 tablet 3   LORazepam (ATIVAN) 0.5 MG tablet Take 1 tablet (0.5 mg total) by mouth 2 (two) times daily as needed for anxiety. 60 tablet 1   methocarbamol (ROBAXIN) 500 MG tablet Take 1 tablet (500 mg total) by mouth 2 (two) times daily as needed (back pain). 45 tablet 2   pantoprazole (PROTONIX) 20 MG tablet Take 1 tablet (20 mg total) by mouth daily. 90 tablet 1   progesterone (PROMETRIUM) 200 MG capsule Take 200 mg by mouth daily.      VENTOLIN HFA 108 (90 Base) MCG/ACT inhaler TAKE 2 PUFFS BY MOUTH EVERY 6 HOURS AS NEEDED FOR WHEEZE OR  SHORTNESS OF BREATH 18 g 1   No current facility-administered medications for this visit.    Allergies as of 05/08/2022   (No Known Allergies)    Family History  Problem Relation Age of Onset   Barrett's esophagus Mother    Diabetes Father    Kidney Stones Father    Hypertension Father    Heart attack Father    Heart disease Father 772      MI , stent   Diabetes Sister    Kidney Stones Brother    Migraines Brother    Urolithiasis Brother  Diabetes Brother    Headache Brother    Heart disease Maternal Grandfather        CABG   Barrett's esophagus Paternal Grandfather    Heart disease Maternal Grandmother 66       rheumatic related heart disesae   Cancer Neg Hx    Stroke Neg Hx    Colon cancer Neg Hx    Esophageal cancer Neg Hx    Stomach cancer Neg Hx    Rectal cancer Neg Hx     Social History   Socioeconomic History   Marital status: Single    Spouse name: Not on file   Number of children: 0   Years of education: Not on file   Highest education level: Not on file  Occupational History   Occupation: Tax adviser    Comment: [employer]  Tobacco Use   Smoking status: Never   Smokeless tobacco: Never  Vaping Use   Vaping Use: Never used  Substance and Sexual Activity   Alcohol use: Yes    Alcohol/week: 4.0 standard drinks of alcohol    Types: 4 Glasses of wine per week   Drug use: No   Sexual activity: Not on file  Other Topics Concern   Not on file  Social History Narrative   2 dogs, muts [place] and [place], exercise 3-4 days per week with walking, Yoga, weights, CPA.  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   01/2020   Social Determinants of Health   Financial Resource Strain: Not on file  Food Insecurity: Not on file  Transportation Needs: Not on file  Physical Activity: Not on file  Stress: Not on file  Social Connections: Not on file  Intimate Partner Violence: Not on file    Review of Systems:    Constitutional: No weight loss, fever or  chills Cardiovascular: No chest pain Respiratory: No SOB Gastrointestinal: See HPI and otherwise negative   Physical Exam:  Vital signs: BP 104/65   Pulse (!) 59   Ht 5' 2"$  (1.575 m)   Wt 106 lb (48.1 kg)   LMP 10/31/2016   BMI 19.39 kg/m    Constitutional:   Pleasant Caucasian female appears to be in NAD, Well developed, Well nourished, alert and cooperative Respiratory: Respirations even and unlabored. Lungs clear to auscultation bilaterally.   No wheezes, crackles, or rhonchi.  Cardiovascular: Normal S1, S2. No MRG. Regular rate and rhythm. No peripheral edema, cyanosis or pallor.  Gastrointestinal:  Soft, nondistended, nontender. No rebound or guarding. Normal bowel sounds. No appreciable masses or hepatomegaly. Rectal:  Not performed.  Psychiatric: Demonstrates good judgement and reason without abnormal affect or behaviors.  RELEVANT LABS AND IMAGING: CBC    Component Value Date/Time   WBC 4.7 01/27/2022 0925   WBC 10.0 09/04/2019 1647   RBC 4.07 01/27/2022 0925   RBC 3.92 09/04/2019 1647   HGB 12.8 01/27/2022 0925   HCT 38.0 01/27/2022 0925   PLT 351 01/27/2022 0925   MCV 93 01/27/2022 0925   MCH 31.4 01/27/2022 0925   MCH 32.7 09/04/2019 1647   MCHC 33.7 01/27/2022 0925   MCHC 33.6 09/04/2019 1647   RDW 11.6 (L) 01/27/2022 0925   LYMPHSABS 1.5 01/27/2022 0925   MONOABS 0.7 09/04/2019 1647   EOSABS 0.2 01/27/2022 0925   BASOSABS 0.1 01/27/2022 0925    CMP     Component Value Date/Time   NA 139 01/27/2022 0925   K 4.5 01/27/2022 0925   CL 102 01/27/2022 0925   CO2 24  01/27/2022 0925   GLUCOSE 92 01/27/2022 0925   GLUCOSE 91 09/04/2019 1647   BUN 15 01/27/2022 0925   CREATININE 0.85 01/27/2022 0925   CREATININE 0.74 10/21/2016 0812   CALCIUM 10.0 01/27/2022 0925   PROT 6.7 01/27/2022 0925   ALBUMIN 4.7 01/27/2022 0925   AST 17 01/27/2022 0925   ALT 12 01/27/2022 0925   ALKPHOS 72 01/27/2022 0925   BILITOT 0.5 01/27/2022 0925   GFRNONAA >60  09/04/2019 1647   GFRAA >60 09/04/2019 1647    Assessment: 1.  Lower abdominal pain: Better over the past 6 weeks with addition of fiber supplement, no further fecal smearing; likely IBS 2.  Esophageal spasm: Relieved by a round of Budesonide slurry  Plan: 1.  Continue BeneFiber 1 tablespoon in the morning. 2.  Patient will call if she has return of symptoms or any further problems.  Otherwise we will follow in clinic as needed with Dr. Hilarie Fredrickson and myself.  Ellouise Newer, PA-C [facility] 05/08/2022, 10:07 AM  Cc: Carlena Hurl, PA-C

## 2022-05-08 NOTE — Patient Instructions (Signed)
_______________________________________________________  If your blood pressure at your visit was 140/90 or greater, please contact your primary care physician to follow up on this.  _______________________________________________________  If you are age 61 or older, your body mass index should be between 23-30. Your Body mass index is 19.39 kg/m. If this is out of the aforementioned range listed, please consider follow up with your Primary Care Provider.  If you are age 48 or younger, your body mass index should be between 19-25. Your Body mass index is 19.39 kg/m. If this is out of the aformentioned range listed, please consider follow up with your Primary Care Provider.   ________________________________________________________  The [facility] providers would like to encourage you to use [facility] to communicate with providers for non-urgent requests or questions.  Due to long hold times on the telephone, sending your provider a message by [facility] may be a faster and more efficient way to get a response.  Please allow 48 business hours for a response.  Please remember that this is for non-urgent requests.  _______________________________________________________  Follow up as needed.

## 2022-05-12 NOTE — Progress Notes (Signed)
Addendum: Reviewed and agree with assessment and management plan. Andy Allende M, MD  

## 2022-06-09 DIAGNOSIS — F411 Generalized anxiety disorder: Secondary | ICD-10-CM | POA: Diagnosis not present

## 2022-06-19 DIAGNOSIS — F411 Generalized anxiety disorder: Secondary | ICD-10-CM | POA: Diagnosis not present

## 2022-06-23 ENCOUNTER — Encounter: Payer: Self-pay | Admitting: Cardiovascular Disease

## 2022-06-23 NOTE — Telephone Encounter (Signed)
If we can get the ECG tracings from [facility], please do so. Otherwise please schedule for a plain treadmill ECG test

## 2022-06-25 NOTE — Telephone Encounter (Signed)
Better by mail I think

## 2022-07-03 DIAGNOSIS — F411 Generalized anxiety disorder: Secondary | ICD-10-CM | POA: Diagnosis not present

## 2022-07-09 DIAGNOSIS — F411 Generalized anxiety disorder: Secondary | ICD-10-CM | POA: Diagnosis not present

## 2022-07-17 DIAGNOSIS — F411 Generalized anxiety disorder: Secondary | ICD-10-CM | POA: Diagnosis not present

## 2022-07-24 ENCOUNTER — Encounter: Payer: Self-pay | Admitting: Cardiovascular Disease

## 2022-07-31 DIAGNOSIS — F411 Generalized anxiety disorder: Secondary | ICD-10-CM | POA: Diagnosis not present

## 2022-08-07 DIAGNOSIS — F411 Generalized anxiety disorder: Secondary | ICD-10-CM | POA: Diagnosis not present

## 2022-08-11 DIAGNOSIS — F411 Generalized anxiety disorder: Secondary | ICD-10-CM | POA: Diagnosis not present

## 2022-08-21 DIAGNOSIS — F411 Generalized anxiety disorder: Secondary | ICD-10-CM | POA: Diagnosis not present

## 2022-09-04 DIAGNOSIS — F411 Generalized anxiety disorder: Secondary | ICD-10-CM | POA: Diagnosis not present

## 2022-09-09 DIAGNOSIS — F411 Generalized anxiety disorder: Secondary | ICD-10-CM | POA: Diagnosis not present

## 2022-09-15 DIAGNOSIS — F411 Generalized anxiety disorder: Secondary | ICD-10-CM | POA: Diagnosis not present

## 2022-10-13 ENCOUNTER — Other Ambulatory Visit: Payer: Self-pay | Admitting: Physician Assistant

## 2022-10-17 DIAGNOSIS — F411 Generalized anxiety disorder: Secondary | ICD-10-CM | POA: Diagnosis not present

## 2022-10-27 DIAGNOSIS — F411 Generalized anxiety disorder: Secondary | ICD-10-CM | POA: Diagnosis not present

## 2022-10-28 ENCOUNTER — Telehealth: Payer: Self-pay | Admitting: Physician Assistant

## 2022-10-28 NOTE — Telephone Encounter (Signed)
Inbound call from patient stating that her anus is hurting and is requesting to speak with nurse. Please advise.

## 2022-10-28 NOTE — Telephone Encounter (Signed)
Returned call to patient. Recommended in person evaluation for possible fissure. Pt did not wish to see PCP or go to urgent care. Pt will wait to be seen next Wednesday, 11/05/22 at 1:30 pm with Rebecca Mulling, PA-C. Pt had no concerns at the end of the call.

## 2022-10-28 NOTE — Telephone Encounter (Signed)
Called and spoke with patient. Pt reports that she has had consistent rectal pain for the last week. Pt states that "it hurts in there". Pt denies any rectal burning or itching. Pt denies that she is constipated or having to strain to have a BM. Patient reports that her stools are smaller, but firm. Pt denies any diarrhea or rectal bleeding. I told pt that it is possible that she has internal hemorrhoids. I told pt to try sitz baths, using wet wipes instead of toilet paper, try preparation H suppositories if case she does have internal hemorrhoids. Any other recommendations?

## 2022-11-04 NOTE — Progress Notes (Unsigned)
11/05/2022 [name] [id number] [birth year]  Referring provider: Jac Canavan, PA-C Primary GI doctor: Dr. Rhea Belton  ASSESSMENT AND PLAN:  Rectal pain with IBS with constipation and lower abdominal discomfort and bloating Anorectal pain may be caused by hemorrhoids, anorectal abscesses, anal fissures, rectocele, rectal cancer, inflammatory bowel disease, pelvic inflammatory disease, chronic benign prostatitis, and coccydynia.  Colonoscopy 2020 recall 2027 No rectal bleeding, no weight loss, no alarm symptoms. On rectal exam negative Hemoccult, possible internal hemorrhoids, hard larger stool burden. Think this is either more of an IBS with constipation, internal hemorrhoids, or possible pelvic floor dysfunction. Will get labs right now to evaluate for inflammation, anemia. Will get squatty potty, FODMAP diet given, MiraLAX fiber initiated.  If does not help can do trial of Linzess. Will recheck thyroid with worsening constipation. Hydrocortisone suppositories given Previous unremarkable CT scan 2021, had pelvic ultrasound with GYN unremarkable, if symptoms do not improve or continue we will consider CT AB and pelvis/imaging of pelvis Close follow-up 2 to 3 months, will call sooner if any issues  Esophagitis, eosinophilic with GERD Controlled at this time with Protonix 40 mg daily Has had intermittent fluticasone slurry that has helped Any further symptoms consider repeat endoscopy but last endoscopy 2018 without any evidence of EOE    Patient Care Team: Tysinger, Kermit Balo, PA-C as PCP - General (Family Medicine) Croitoru, Rachelle Hora, MD as PCP - Cardiology (Cardiology) Elon Spanner, Madelaine Etienne, MD as Consulting Physician (Obstetrics and Gynecology) Pyrtle, Carie Caddy, MD as Consulting Physician (Gastroenterology) Marcelyn Bruins, MD as Consulting Physician (Allergy) Jerilee Field, MD as Consulting Physician (Urology)  HISTORY OF PRESENT ILLNESS: 62 y.o. female with a  past medical history of GERD, esophageal stricture, SIBO, esophageal spasm, chronic back pain, depression, anxiety, IBS and others listed below presents for evaluation of rectal pain.   2015 EGD in [facility] with biopsies consistent with eosinophilic esophagitis with abundant eosinophilic infiltrates greater than 100 per high-power field and eosinophilic microabscesses, savory dilated empirically for complaints of dysphagia and had a nonobstructive stricture.  Also noted to have small white plaques in the esophagus. 11/2016 EGD normal with biopsies showing no evidence of eosinophilic esophagitis, gastric biopsies with mild chronic gastritis, no H. pylori and small bowel biopsies were negative.  08/2018 colonoscopy with 2 small tubular adenomas otherwise normal.  Repeat recommended in 7 years. Recall 2027 something  08/23/2020 seen by Mike Gip for epigastric pain and regurgitation as well as dysphagia.  At that time suspected to have exacerbation of eosinophilic esophagitis.  At that time Pantoprazole increased to 40 mg twice a day for 4 weeks then decrease to 1 p.o. every morning if symptoms were better.  Started Budesonide slurry 2 mg per 10 mL - 5 cc twice daily, changed to once daily dosing due to patient not being able to sleep if you take it at night.  Discussed EGD if symptoms persisted.  03/27/2022 OV [name] having some fecal smearing/lower abdominal discomfort improved with fiber thought to be IBS.  Was also having esophageal spasm relieved with budesonide slurry 10/28/2022 patient called with consistent rectal pain for a week. States stools have been smaller and firm, denies straining or constipation.  Denies diarrhea or hematochezia.  Has tried sitz bath's, wet wipes, Preparation H without help.  She has been having lower suprapubic AB pain since Jan, saw GYN/urologist with cystoscopy and pelvis US without any issues. She has had that pain intermittent, once a week, can be all day. No  urinary, fever, chills, stool changes, nausea or vomiting.  Has had rectal pain last 2 weeks. No nocturnal symptoms. Does not hurt daily, achy rectal pain. Gradually gets worse as the day goes on, better in the morning. She continues to have smaller stools and will have incomplete feeling of emptying with 2-3 small Bm's. Feels never cleans completed, can have fecal smearing between Bm's. Having lower back pain different from her normal, lower pain. Can have vaginal heaviness.  No vaginal births, s/p appy but no other AB surgeries.  No blood in stool no melena.  States her GERD and EOE/swallowing is controlled at this time on once a day. She does have AB bloating No weight loss.  She has voltern pills and takes for back pain, will take once every 6-9 months.  No NSAIDS. Has one large glass of wine every evening for the last year.  She denies blood thinner use.  She denies tobacco use.  She denies drug use.    She  reports that she has never smoked. She has never used smokeless tobacco. She reports current alcohol use of about 4.0 standard drinks of alcohol per week. She reports that she does not use drugs.  RELEVANT LABS AND IMAGING: CBC    Component Value Date/Time   WBC 4.7 01/27/2022 0925   WBC 10.0 09/04/2019 1647   RBC 4.07 01/27/2022 0925   RBC 3.92 09/04/2019 1647   HGB 12.8 01/27/2022 0925   HCT 38.0 01/27/2022 0925   PLT 351 01/27/2022 0925   MCV 93 01/27/2022 0925   MCH 31.4 01/27/2022 0925   MCH 32.7 09/04/2019 1647   MCHC 33.7 01/27/2022 0925   MCHC 33.6 09/04/2019 1647   RDW 11.6 (L) 01/27/2022 0925   LYMPHSABS 1.5 01/27/2022 0925   MONOABS 0.7 09/04/2019 1647   EOSABS 0.2 01/27/2022 0925   BASOSABS 0.1 01/27/2022 0925   Recent Labs    01/27/22 0925  HGB 12.8    CMP     Component Value Date/Time   NA 139 01/27/2022 0925   K 4.5 01/27/2022 0925   CL 102 01/27/2022 0925   CO2 24 01/27/2022 0925   GLUCOSE 92 01/27/2022 0925   GLUCOSE 91 09/04/2019 1647    BUN 15 01/27/2022 0925   CREATININE 0.85 01/27/2022 0925   CREATININE 0.74 10/21/2016 0812   CALCIUM 10.0 01/27/2022 0925   PROT 6.7 01/27/2022 0925   ALBUMIN 4.7 01/27/2022 0925   AST 17 01/27/2022 0925   ALT 12 01/27/2022 0925   ALKPHOS 72 01/27/2022 0925   BILITOT 0.5 01/27/2022 0925   GFRNONAA >60 09/04/2019 1647   GFRAA >60 09/04/2019 1647      Latest Ref Rng & Units 01/27/2022    9:25 AM 01/25/2021    9:10 AM 09/04/2019    4:47 PM  Hepatic Function  Total Protein 6.0 - 8.5 g/dL 6.7  7.1  7.1   Albumin 3.8 - 4.9 g/dL 4.7  4.8  4.1   AST 0 - 40 IU/L 17  22  23    ALT 0 - 32 IU/L 12  17  24    Alk Phosphatase 44 - 121 IU/L 72  75  73   Total Bilirubin 0.0 - 1.2 mg/dL 0.5  0.4  0.5       Current Medications:   Current Outpatient Medications (Endocrine & Metabolic):    estradiol (VIVELLE-DOT) 0.025 MG/24HR, Place 1 patch onto the skin 2 (two) times a week.    progesterone (PROMETRIUM) 200 MG capsule,  Take 200 mg by mouth daily.    Current Outpatient Medications (Respiratory):    fluticasone (FLONASE) 50 MCG/ACT nasal spray, Place 2 sprays into both nostrils daily.   fluticasone (FLOVENT HFA) 220 MCG/ACT inhaler, Inhale 2 puffs into the lungs as needed. Inhale two puffs one to two times daily to prevent cough or wheeze. Rinse, gargle, and spit after use.   VENTOLIN HFA 108 (90 Base) MCG/ACT inhaler, TAKE 2 PUFFS BY MOUTH EVERY 6 HOURS AS NEEDED FOR WHEEZE OR SHORTNESS OF BREATH  Current Outpatient Medications (Analgesics):    diclofenac (VOLTAREN) 75 MG EC tablet, Take 1 tablet (75 mg total) by mouth 2 (two) times daily.   Current Outpatient Medications (Other):    betamethasone dipropionate 0.05 % cream, Apply topically 2 (two) times daily as needed.   cholecalciferol (VITAMIN D3) 25 MCG (1000 UT) tablet, Take 2 tablets (2,000 Units total) by mouth daily.   hydrocortisone (ANUSOL-HC) 2.5 % rectal cream, Place 1 Application rectally 2 (two) times daily.   hydrocortisone  (ANUSOL-HC) 25 MG suppository, Place 1 suppository (25 mg total) rectally 2 (two) times daily.   LORazepam (ATIVAN) 0.5 MG tablet, Take 1 tablet (0.5 mg total) by mouth 2 (two) times daily as needed for anxiety.   methocarbamol (ROBAXIN) 500 MG tablet, Take 1 tablet (500 mg total) by mouth 2 (two) times daily as needed (back pain).   pantoprazole (PROTONIX) 20 MG tablet, TAKE ONE TABLET BY MOUTH ONE TIME DAILY  Medical History:  Past Medical History:  Diagnosis Date   Allergy    Anxiety    Asthma    Chronic back pain    Depression    Eosinophilic esophagitis    Esophageal spasm    Esophageal stricture    Gastritis    GERD (gastroesophageal reflux disease)    Headache    occasional   IBS (irritable bowel syndrome)    Kidney stone 2015   gets every few years, hx/o calcium oxalate stones   Osteoporosis    Scoliosis    Seasonal allergic conjunctivitis    Seasonal allergic rhinitis    Small intestinal bacterial overgrowth    Vitamin D deficiency    Wears glasses    Allergies: No Known Allergies   Surgical History:  She  has a past surgical history that includes Appendectomy; Esophageal dilation; Esophagoplasty (07/2014); Colonoscopy (2013); Kidney stone surgery (1986); Lithotripsy; Incision and drainage abscess (Bilateral, 09/14/2015); Breast excisional biopsy; Ganglion cyst excision (Right, 12/31/2017); and Colonoscopy (08/2018). Family History:  Her family history includes Barrett's esophagus in her mother and paternal grandfather; Diabetes in her brother, father, and sister; Headache in her brother; Heart attack in her father; Heart disease in her maternal grandfather; Heart disease (age of onset: 47) in her maternal grandmother; Heart disease (age of onset: 31) in her father; Hypertension in her father; Kidney Stones in her brother and father; Migraines in her brother; Urolithiasis in her brother.  REVIEW OF SYSTEMS  : All other systems reviewed and negative except where noted in  the History of Present Illness.  PHYSICAL EXAM: BP 100/60   Pulse (!) 50   Ht 5\' 2"  (1.575 m)   Wt 108 lb (49 kg)   LMP 10/31/2016   BMI 19.75 kg/m  General Appearance: Well nourished, in no apparent distress. Head:   Normocephalic and atraumatic. Eyes:  sclerae anicteric,conjunctive pink  Respiratory: Respiratory effort normal, BS equal bilaterally without rales, rhonchi, wheezing. Cardio: RRR with no MRGs. Peripheral pulses intact.  Abdomen: Soft,  Non-distended ,active bowel sounds. No tenderness . Without guarding and Without rebound. No masses. Rectal: Normal external rectal exam, normal rectal tone, appreciated internal hemorrhoids, non-tender, no masses, large volume hard brown stool, hemoccult Negative Musculoskeletal: Full ROM, Normal gait. Without edema. Skin:  Dry and intact without significant lesions or rashes Neuro: Alert and  oriented x4;  No focal deficits. Psych:  Cooperative. Normal mood and affect.    Doree Albee, PA-C 2:38 PM

## 2022-11-05 ENCOUNTER — Encounter: Payer: Self-pay | Admitting: Physician Assistant

## 2022-11-05 ENCOUNTER — Ambulatory Visit: Payer: BC Managed Care – PPO | Admitting: Physician Assistant

## 2022-11-05 ENCOUNTER — Other Ambulatory Visit (INDEPENDENT_AMBULATORY_CARE_PROVIDER_SITE_OTHER): Payer: BC Managed Care – PPO

## 2022-11-05 VITALS — BP 100/60 | HR 50 | Ht 62.0 in | Wt 108.0 lb

## 2022-11-05 DIAGNOSIS — K6289 Other specified diseases of anus and rectum: Secondary | ICD-10-CM

## 2022-11-05 DIAGNOSIS — K581 Irritable bowel syndrome with constipation: Secondary | ICD-10-CM

## 2022-11-05 DIAGNOSIS — R1030 Lower abdominal pain, unspecified: Secondary | ICD-10-CM | POA: Diagnosis not present

## 2022-11-05 DIAGNOSIS — K200 Eosinophilic esophagitis: Secondary | ICD-10-CM

## 2022-11-05 DIAGNOSIS — K219 Gastro-esophageal reflux disease without esophagitis: Secondary | ICD-10-CM

## 2022-11-05 LAB — COMPREHENSIVE METABOLIC PANEL
ALT: 15 U/L (ref 0–35)
AST: 17 U/L (ref 0–37)
Albumin: 4.4 g/dL (ref 3.5–5.2)
Alkaline Phosphatase: 77 U/L (ref 39–117)
BUN: 16 mg/dL (ref 6–23)
CO2: 30 mEq/L (ref 19–32)
Calcium: 9.3 mg/dL (ref 8.4–10.5)
Chloride: 102 mEq/L (ref 96–112)
Creatinine, Ser: 1.04 mg/dL (ref 0.40–1.20)
GFR: 58.34 mL/min — ABNORMAL LOW (ref >60.00)
Glucose, Bld: 94 mg/dL (ref 70–99)
Potassium: 4.1 mEq/L (ref 3.5–5.1)
Sodium: 137 mEq/L (ref 135–145)
Total Bilirubin: 0.4 mg/dL (ref 0.2–1.2)
Total Protein: 7 g/dL (ref 6.0–8.3)

## 2022-11-05 LAB — CBC WITH DIFFERENTIAL/PLATELET
Basophils Absolute: 0.1 10*3/uL (ref 0.0–0.1)
Basophils Relative: 1.4 % (ref 0.0–3.0)
Eosinophils Absolute: 0.4 10*3/uL (ref 0.0–0.7)
Eosinophils Relative: 6.9 % — ABNORMAL HIGH (ref 0.0–5.0)
HCT: 39.8 % (ref 36.0–46.0)
Hemoglobin: 13.3 g/dL (ref 12.0–15.0)
Lymphocytes Relative: 31.2 % (ref 12.0–46.0)
Lymphs Abs: 1.8 10*3/uL (ref 0.7–4.0)
MCHC: 33.4 g/dL (ref 30.0–36.0)
MCV: 95.4 fl (ref 78.0–100.0)
Monocytes Absolute: 0.4 10*3/uL (ref 0.1–1.0)
Monocytes Relative: 7 % (ref 3.0–12.0)
Neutro Abs: 3.1 10*3/uL (ref 1.4–7.7)
Neutrophils Relative %: 53.5 % (ref 43.0–77.0)
Platelets: 351 10*3/uL (ref 150.0–400.0)
RBC: 4.17 Mil/uL (ref 3.87–5.11)
RDW: 12.3 % (ref 11.5–15.5)
WBC: 5.9 10*3/uL (ref 4.0–10.5)

## 2022-11-05 LAB — TSH: TSH: 1.9 u[IU]/mL (ref 0.35–5.50)

## 2022-11-05 LAB — SEDIMENTATION RATE: Sed Rate: 5 mm/hr (ref 0–30)

## 2022-11-05 MED ORDER — HYDROCORTISONE (PERIANAL) 2.5 % EX CREA: 1.0000 | TOPICAL_CREAM | Freq: Two times a day (BID) | CUTANEOUS | 2 refills | Status: DC

## 2022-11-05 MED ORDER — HYDROCORTISONE ACETATE 25 MG RE SUPP: 25.0000 mg | Freq: Two times a day (BID) | RECTAL | 0 refills | Status: DC

## 2022-11-05 NOTE — Patient Instructions (Addendum)
Your provider has requested that you go to the basement level for lab work before leaving today. Press "B" on the elevator. The lab is located at the first door on the left as you exit the elevator.   Miralax is an osmotic laxative.  It only brings more water into the stool.  This is safe to take daily.  Can take up to 17 gram of miralax twice a day.  Mix with juice or coffee.  Start 1 capful at night for 3-4 days and reassess your response in 3-4 days.  You can increase and decrease the dose based on your response.  Remember, it can take up to 3-4 days to take effect OR for the effects to wear off.   I often pair this with benefiber in the morning to help assure the stool is not too loose.   Please do sitz baths- these can be found at the pharmacy. It is a Chief Operating Officer that is put in your toliet.  Please increase fiber or add benefiber, increase water and increase acitivity.  Will send in hydrocoritsone suppository, cheapest with GOODRX from [employer], [employer], [employer] or [employer] if your insurance does not pay for it. If the hemorrhoid suppository sent in is too expensive you can do this over the counter trick.  Apply a pea size amount of over the counter Anusol HC cream to the tip of an over the counter PrepH suppository and insert rectally once every night for at least 7 nights.  If this does not improve there are procedures that can be done.   Toileting tips to help with your constipation - Drink at least 64-80 ounces of water/liquid per day. - Establish a time to try to move your bowels every day.  For many people, this is after a cup of coffee or after a meal such as breakfast. - Sit all of the way back on the toilet keeping your back fairly straight and while sitting up, try to rest the tops of your forearms on your upper thighs.   - Raising your feet with a step stool/squatty potty can be helpful to improve the angle that allows your stool to pass through the rectum. - Relax  the rectum feeling it bulge toward the toilet water.  If you feel your rectum raising toward your body, you are contracting rather than relaxing. - Breathe in and slowly exhale. "Belly breath" by expanding your belly towards your belly button. Keep belly expanded as you gently direct pressure down and back to the anus.  A low pitched GRRR sound can assist with increasing intra-abdominal pressure.  (Can also trying to blow on a pinwheel and make it move, this helps with the same belly breathing) - Repeat 3-4 times. If unsuccessful, contract the pelvic floor to restore normal tone and get off the toilet.  Avoid excessive straining. - To reduce excessive wiping by teaching your anus to normally contract, place hands on outer aspect of knees and resist knee movement outward.  Hold 5-10 second then place hands just inside of knees and resist inward movement of knees.  Hold 5 seconds.  Repeat a few times each way.  Go to the ER if unable to pass gas, severe AB pain, unable to hold down food, any shortness of breath of chest pain.  Linzess CAN GIVE SAMPLES 72 MCG FOR YOU TRY IF MIRALAX CAUSES GAS *IBS-C patients may begin to experience relief from belly pain and overall abdominal symptoms (pain, discomfort, and bloating) in  about 1 week,  with symptoms typically improving over 12 weeks.  Take at least 30 minutes before the first meal of the day on an empty stomach You can have a loose stool if you eat a high-fat breakfast. Give it at least 7 days, may have more bowel movements during that time.   The diarrhea should go away and you should start having normal, complete, full bowel movements.  It may be helpful to start treatment when you can be near the comfort of your own bathroom, such as a weekend.  After you are out we can send in a prescription if you did well, there is a prescription card   About Hemorrhoids  Hemorrhoids are swollen veins in the lower rectum and anus.  Also called piles,  hemorrhoids are a common problem.  Hemorrhoids may be internal (inside the rectum) or external (around the anus).  Internal Hemorrhoids  Internal hemorrhoids are often painless, but they rarely cause bleeding.  The internal veins may stretch and fall down (prolapse) through the anus to the outside of the body.  The veins may then become irritated and painful.  External Hemorrhoids  External hemorrhoids can be easily seen or felt around the anal opening.  They are under the skin around the anus.  When the swollen veins are scratched or broken by straining, rubbing or wiping they sometimes bleed.  How Hemorrhoids Occur  Veins in the rectum and around the anus tend to swell under pressure.  Hemorrhoids can result from increased pressure in the veins of your anus or rectum.  Some sources of pressure are:  Straining to have a bowel movement because of constipation Waiting too long to have a bowel movement Coughing and sneezing often Sitting for extended periods of time, including on the toilet Diarrhea Obesity Trauma or injury to the anus Some liver diseases Stress Family history of hemorrhoids Pregnancy  Pregnant women should try to avoid becoming constipated, because they are more likely to have hemorrhoids during pregnancy.  In the last trimester of pregnancy, the enlarged uterus may press on blood vessels and causes hemorrhoids.  In addition, the strain of childbirth sometimes causes hemorrhoids after the birth.  Symptoms of Hemorrhoids  Some symptoms of hemorrhoids include: Swelling and/or a tender lump around the anus Itching, mild burning and bleeding around the anus Painful bowel movements with or without constipation Bright red blood covering the stool, on toilet paper or in the toilet bowel.   Symptoms usually go away within a few days.  Always talk to your doctor about any bleeding to make sure it is not from some other causes.  Diagnosing and Treating  Hemorrhoids  Diagnosis is made by an examination by your healthcare provider.  Special test can be performed by your doctor.    Most cases of hemorrhoids can be treated with: High-fiber diet: Eat more high-fiber foods, which help prevent constipation.  Ask for more detailed fiber information on types and sources of fiber from your healthcare provider. Fluids: Drink plenty of water.  This helps soften bowel movements so they are easier to pass. Sitz baths and cold packs: Sitting in lukewarm water two or three times a day for 15 minutes cleases the anal area and may relieve discomfort.  If the water is too hot, swelling around the anus will get worse.  Placing a cloth-covered ice pack on the anus for ten minutes four times a day can also help reduce selling.  Gently pushing a prolapsed hemorrhoid back inside  after the bath or ice pack can be helpful. Medications: For mild discomfort, your healthcare provider may suggest over-the-counter pain medication or prescribe a cream or ointment for topical use.  The cream may contain witch hazel, zinc oxide or petroleum jelly.  Medicated suppositories are also a treatment option.  Always consult your doctor before applying medications or creams. Procedures and surgeries: There are also a number of procedures and surgeries to shrink or remove hemorrhoids in more serious cases.  Talk to your physician about these options.  You can often prevent hemorrhoids or keep them from becoming worse by maintaining a healthy lifestyle.  Eat a fiber-rich diet of fruits, vegetables and whole grains.  Also, drink plenty of water and exercise regularly.   2007, [employer] Doc.30  Here some information about pelvic floor dysfunction. This may be contributing to some of your symptoms. We will continue with our evaluation but I do want you to consider adding on fiber supplement with low-dose MiraLAX daily. We could also refer to pelvic floor physical  therapy.   Pelvic Floor Dysfunction, Female Pelvic floor dysfunction (PFD) is a condition that results when the group of muscles and connective tissues that support the organs in the pelvis (pelvic floor muscles) do not work well. These muscles and their connections form a sling that supports the colon and bladder. In women, they also support the uterus. PFD causes pelvic floor muscles to be too weak, too tight, or both. In PFD, muscle movements are not coordinated. This may cause bowel or bladder problems. It may also cause pain. What are the causes? This condition may be caused by an injury to the pelvic area or by a weakening of pelvic muscles. This often results from pregnancy and childbirth or other types of strain. In many cases, the exact cause is not known. What increases the risk? The following factors may make you more likely to develop this condition: Having chronic bladder tissue inflammation (interstitial cystitis). Being an older person. Being overweight. History of radiation treatment for cancer in the pelvic region. Previous pelvic surgery, such as removal of the uterus (hysterectomy). What are the signs or symptoms? Symptoms of this condition vary and may include: Bladder symptoms, such as: Trouble starting urination and emptying the bladder. Frequent urinary tract infections. Leaking urine when coughing, laughing, or exercising (stress incontinence). Having to pass urine urgently or frequently. Pain when passing urine. Bowel symptoms, such as: Constipation. Urgent or frequent bowel movements. Incomplete bowel movements. Painful bowel movements. Leaking stool or gas. Unexplained genital or rectal pain. Genital or rectal muscle spasms. Low back pain. Other symptoms may include: A heavy, full, or aching feeling in the vagina. A bulge that protrudes into the vagina. Pain during or after sex. How is this diagnosed? This condition may be diagnosed based on: Your  symptoms and medical history. A physical exam. During the exam, your health care provider may check your pelvic muscles for tightness, spasm, pain, or weakness. This may include a rectal exam and a pelvic exam. In some cases, you may have diagnostic tests, such as: Electrical muscle function tests. Urine flow testing. X-ray tests of bowel function. Ultrasound of the pelvic organs. How is this treated? Treatment for this condition depends on the symptoms. Treatment options include: Physical therapy. This may include Kegel exercises to help relax or strengthen the pelvic floor muscles. Biofeedback. This type of therapy provides feedback on how tight your pelvic floor muscles are so that you can learn to control them. Internal  or external massage therapy. A treatment that involves electrical stimulation of the pelvic floor muscles to help control pain (transcutaneous electrical nerve stimulation, or TENS). Sound wave therapy (ultrasound) to reduce muscle spasms. Medicines, such as: Muscle relaxants. Bladder control medicines. Surgery to reconstruct or support pelvic floor muscles may be an option if other treatments do not help. Follow these instructions at home: Activity Do your usual activities as told by your health care provider. Ask your health care provider if you should modify any activities. Do pelvic floor strengthening or relaxing exercises at home as told by your physical therapist. Lifestyle Maintain a healthy weight. Eat foods that are high in fiber, such as beans, whole grains, and fresh fruits and vegetables. Limit foods that are high in fat and processed sugars, such as fried or sweet foods. Manage stress with relaxation techniques such as yoga or meditation. General instructions If you have problems with leakage: Use absorbable pads or wear padded underwear. Wash frequently with mild soap. Keep your genital and anal area as clean and dry as possible. Ask your health care  provider if you should try a barrier cream to prevent skin irritation. Take warm baths to relieve pelvic muscle tension or spasms. Take over-the-counter and prescription medicines only as told by your health care provider. Keep all follow-up visits. How is this prevented? The cause of PFD is not always known, but there are a few things you can do to reduce the risk of developing this condition, including: Staying at a healthy weight. Getting regular exercise. Managing stress. Contact a health care provider if: Your symptoms are not improving with home care. You have signs or symptoms of PFD that get worse at home. You develop new signs or symptoms. You have signs of a urinary tract infection, such as: Fever. Chills. Increased urinary frequency. A burning feeling when urinating. You have not had a bowel movement in 3 days (constipation). Summary Pelvic floor dysfunction results when the muscles and connective tissues in your pelvic floor do not work well. These muscles and their connections form a sling that supports your colon and bladder. In women, they also support the uterus. PFD may be caused by an injury to the pelvic area or by a weakening of pelvic muscles. PFD causes pelvic floor muscles to be too weak, too tight, or a combination of both. Symptoms may vary from person to person. In most cases, PFD can be treated with physical therapies and medicines. Surgery may be an option if other treatments do not help. This information is not intended to replace advice given to you by your health care provider. Make sure you discuss any questions you have with your health care provider. Document Revised: 07/18/2020 Document Reviewed: 07/18/2020 Elsevier Patient Education  2022 [employer].

## 2022-11-07 NOTE — Progress Notes (Signed)
Addendum: Reviewed and agree with assessment and management plan. Pyrtle, Jay M, MD  

## 2022-11-14 DIAGNOSIS — F411 Generalized anxiety disorder: Secondary | ICD-10-CM | POA: Diagnosis not present

## 2022-11-27 DIAGNOSIS — F411 Generalized anxiety disorder: Secondary | ICD-10-CM | POA: Diagnosis not present

## 2022-12-09 DIAGNOSIS — F411 Generalized anxiety disorder: Secondary | ICD-10-CM | POA: Diagnosis not present

## 2022-12-15 ENCOUNTER — Encounter: Payer: Self-pay | Admitting: Internal Medicine

## 2022-12-15 DIAGNOSIS — Z681 Body mass index (BMI) 19 or less, adult: Secondary | ICD-10-CM | POA: Diagnosis not present

## 2022-12-15 DIAGNOSIS — Z124 Encounter for screening for malignant neoplasm of cervix: Secondary | ICD-10-CM | POA: Diagnosis not present

## 2022-12-15 DIAGNOSIS — Z13820 Encounter for screening for osteoporosis: Secondary | ICD-10-CM | POA: Diagnosis not present

## 2022-12-15 DIAGNOSIS — Z1231 Encounter for screening mammogram for malignant neoplasm of breast: Secondary | ICD-10-CM | POA: Diagnosis not present

## 2022-12-15 DIAGNOSIS — Z01419 Encounter for gynecological examination (general) (routine) without abnormal findings: Secondary | ICD-10-CM | POA: Diagnosis not present

## 2022-12-15 LAB — HM DEXA SCAN

## 2022-12-15 LAB — HM MAMMOGRAPHY

## 2022-12-17 DIAGNOSIS — F411 Generalized anxiety disorder: Secondary | ICD-10-CM | POA: Diagnosis not present

## 2022-12-22 DIAGNOSIS — F411 Generalized anxiety disorder: Secondary | ICD-10-CM | POA: Diagnosis not present

## 2022-12-26 ENCOUNTER — Emergency Department (HOSPITAL_BASED_OUTPATIENT_CLINIC_OR_DEPARTMENT_OTHER): Payer: BC Managed Care – PPO

## 2022-12-26 ENCOUNTER — Emergency Department (HOSPITAL_BASED_OUTPATIENT_CLINIC_OR_DEPARTMENT_OTHER)
Admission: EM | Admit: 2022-12-26 | Discharge: 2022-12-26 | Disposition: A | Discharge status: Home / Self Care | Payer: BC Managed Care – PPO | Attending: Emergency Medicine | Admitting: Emergency Medicine

## 2022-12-26 ENCOUNTER — Encounter (HOSPITAL_BASED_OUTPATIENT_CLINIC_OR_DEPARTMENT_OTHER): Payer: Self-pay | Admitting: Pediatrics

## 2022-12-26 ENCOUNTER — Other Ambulatory Visit: Payer: Self-pay

## 2022-12-26 DIAGNOSIS — R072 Precordial pain: Secondary | ICD-10-CM | POA: Insufficient documentation

## 2022-12-26 DIAGNOSIS — R0789 Other chest pain: Secondary | ICD-10-CM | POA: Diagnosis not present

## 2022-12-26 DIAGNOSIS — R079 Chest pain, unspecified: Secondary | ICD-10-CM | POA: Diagnosis not present

## 2022-12-26 LAB — CBC
HCT: 38.8 % (ref 36.0–46.0)
Hemoglobin: 13.4 g/dL (ref 12.0–15.0)
MCH: 32 pg (ref 26.0–34.0)
MCHC: 34.5 g/dL (ref 30.0–36.0)
MCV: 92.6 fL (ref 80.0–100.0)
Platelets: 308 10*3/uL (ref 150–400)
RBC: 4.19 MIL/uL (ref 3.87–5.11)
RDW: 11.4 % — ABNORMAL LOW (ref 11.5–15.5)
WBC: 9.8 10*3/uL (ref 4.0–10.5)
nRBC: 0 % (ref 0.0–0.2)

## 2022-12-26 LAB — HEPATIC FUNCTION PANEL
ALT: 21 U/L (ref 0–44)
AST: 23 U/L (ref 15–41)
Albumin: 4 g/dL (ref 3.5–5.0)
Alkaline Phosphatase: 63 U/L (ref 38–126)
Bilirubin, Direct: 0.2 mg/dL (ref 0.0–0.2)
Indirect Bilirubin: 1.1 mg/dL — ABNORMAL HIGH (ref 0.3–0.9)
Total Bilirubin: 1.3 mg/dL — ABNORMAL HIGH (ref 0.3–1.2)
Total Protein: 6.9 g/dL (ref 6.5–8.1)

## 2022-12-26 LAB — BASIC METABOLIC PANEL
Anion gap: 14 (ref 5–15)
BUN: 20 mg/dL (ref 6–20)
CO2: 25 mmol/L (ref 22–32)
Calcium: 9.2 mg/dL (ref 8.9–10.3)
Chloride: 97 mmol/L — ABNORMAL LOW (ref 98–111)
Creatinine, Ser: 0.93 mg/dL (ref 0.44–1.00)
GFR, Estimated: >60 mL/min (ref >60)
Glucose, Bld: 99 mg/dL (ref 70–99)
Potassium: 4 mmol/L (ref 3.5–5.1)
Sodium: 136 mmol/L (ref 135–145)

## 2022-12-26 LAB — TROPONIN I (HIGH SENSITIVITY)
Troponin I (High Sensitivity): <2 ng/L (ref <18)
Troponin I (High Sensitivity): <2 ng/L (ref <18)

## 2022-12-26 LAB — LIPASE, BLOOD: Lipase: 28 U/L (ref 11–51)

## 2022-12-26 MED ORDER — ACETAMINOPHEN 325 MG PO TABS
650.0000 mg | ORAL_TABLET | Freq: Once | ORAL | Status: AC
  Administered 2022-12-26: 650 mg via ORAL
  Filled 2022-12-26: qty 2

## 2022-12-26 MED ORDER — ALUM & MAG HYDROXIDE-SIMETH 200-200-20 MG/5ML PO SUSP
30.0000 mL | Freq: Once | ORAL | Status: AC
  Administered 2022-12-26: 30 mL via ORAL
  Filled 2022-12-26: qty 30

## 2022-12-26 MED ORDER — LIDOCAINE VISCOUS HCL 2 % MT SOLN
15.0000 mL | Freq: Once | OROMUCOSAL | Status: AC
  Administered 2022-12-26: 15 mL via ORAL
  Filled 2022-12-26: qty 15

## 2022-12-26 MED ORDER — SUCRALFATE 1 G PO TABS: 1.0000 g | ORAL_TABLET | Freq: Three times a day (TID) | ORAL | 0 refills | Status: DC

## 2022-12-26 NOTE — ED Triage Notes (Signed)
C/O chest pain that is stabbing in midsternal and goes through her back. Patient reports she does have hx of esophagitis and has been vomiting all night.

## 2022-12-26 NOTE — ED Provider Notes (Signed)
[facility] Provider Note   CSN: [id number] Arrival date & time: 12/26/22  1229     History  Chief Complaint  Patient presents with   Chest Pain    [name] is a 62 y.o. female.  62 y.o. female presenting for 24 hours of nausea and vomiting accompanied with intermittent acute midsternal chest pain that radiates to her back.  She reports eating dinner last night and then becoming extremely nauseated vomiting and then having sharp chest pain that lasted 30 to 45 minutes.  Chest pain was described as an ice pick going through her sternum into her back.  Pain was not associated with activity and appeared at random.  She has a history of eosinophilic esophagitis which she takes 40 mg Protonix daily.  She also has a history of an esophageal tear several years ago.  She denies hematemesis and reports her last episode of vomiting was around 4 AM this morning.  She endorses lack of appetite but has been able to tolerate liquids.  She denies abdominal pain, diarrhea, recent fever or sick contacts.  Last bowel movement was yesterday and was normal.  She took 40 mg Protonix this morning without symptomatic relief and Tylenol without significant benefit.  She is not having any chest discomfort currently and pain is not worse with deep inspiration.  She denies recent travel by plane or automobile.  She has never been on blood thinners or had any heart problems.  She denies having a blood clot.  Family history is significant for coronary artery disease in her father who had stents placed in 2017.  Surgical history significant for appendectomy.  She denies ever having problems with her gallbladder.  The history is provided by the patient. No language interpreter was used.  Chest Pain Pain location:  Substernal area Pain quality: stabbing   Pain radiates to:  Mid back Associated symptoms: nausea and vomiting   Associated symptoms: no abdominal pain, no  back pain, no cough, no dizziness, no fatigue, no fever, no palpitations and no shortness of breath        Home Medications Prior to Admission medications   Medication Sig Start Date End Date Taking? Authorizing Provider  sucralfate (CARAFATE) 1 g tablet Take 1 tablet (1 g total) by mouth 4 (four) times daily -  with meals and at bedtime. 12/26/22  Yes Glendale Chard, DO  betamethasone dipropionate 0.05 % cream Apply topically 2 (two) times daily as needed. 04/16/21   [provider]  cholecalciferol (VITAMIN D3) 25 MCG (1000 UT) tablet Take 2 tablets (2,000 Units total) by mouth daily. 01/25/19   Tysinger, Kermit Balo, PA-C  diclofenac (VOLTAREN) 75 MG EC tablet Take 1 tablet (75 mg total) by mouth 2 (two) times daily. 01/24/19   Tysinger, Kermit Balo, PA-C  estradiol (VIVELLE-DOT) 0.025 MG/24HR Place 1 patch onto the skin 2 (two) times a week.  12/05/18   [provider]  fluticasone (FLONASE) 50 MCG/ACT nasal spray Place 2 sprays into both nostrils daily. 09/08/18   Marcelyn Bruins, MD  fluticasone (FLOVENT HFA) 220 MCG/ACT inhaler Inhale 2 puffs into the lungs as needed. Inhale two puffs one to two times daily to prevent cough or wheeze. Rinse, gargle, and spit after use. 12/18/21   Tysinger, Kermit Balo, PA-C  hydrocortisone (ANUSOL-HC) 2.5 % rectal cream Place 1 Application rectally 2 (two) times daily. 11/05/22   Doree Albee, PA-C  hydrocortisone (ANUSOL-HC) 25 MG suppository Place 1  suppository (25 mg total) rectally 2 (two) times daily. 11/05/22   Doree Albee, PA-C  LORazepam (ATIVAN) 0.5 MG tablet Take 1 tablet (0.5 mg total) by mouth 2 (two) times daily as needed for anxiety. 01/27/22   Tysinger, Kermit Balo, PA-C  methocarbamol (ROBAXIN) 500 MG tablet Take 1 tablet (500 mg total) by mouth 2 (two) times daily as needed (back pain). 01/27/22   Tysinger, Kermit Balo, PA-C  pantoprazole (PROTONIX) 20 MG tablet TAKE ONE TABLET BY MOUTH ONE TIME DAILY 10/13/22   Unk Lightning, PA  progesterone (PROMETRIUM) 200 MG capsule Take 200 mg by mouth daily.  08/23/19   [provider]  VENTOLIN HFA 108 (90 Base) MCG/ACT inhaler TAKE 2 PUFFS BY MOUTH EVERY 6 HOURS AS NEEDED FOR WHEEZE OR SHORTNESS OF BREATH 11/14/19   Marcelyn Bruins, MD      Allergies    Patient has no known allergies.    Review of Systems   Review of Systems  Constitutional:  Positive for appetite change. Negative for activity change, fatigue, fever and unexpected weight change.  Respiratory:  Negative for cough, chest tightness and shortness of breath.   Cardiovascular:  Positive for chest pain. Negative for palpitations.  Gastrointestinal:  Positive for nausea and vomiting. Negative for abdominal distention, abdominal pain, constipation and diarrhea.  Musculoskeletal:  Negative for back pain.  Neurological:  Negative for dizziness, syncope and light-headedness.    Physical Exam Updated Vital Signs BP 106/62   Pulse 70   Temp 98.5 F (36.9 C) (Oral)   Resp 15   Ht 5\' 2"  (1.575 m)   Wt 46.7 kg   LMP 10/31/2016   SpO2 97%   BMI 18.84 kg/m  Physical Exam Constitutional:      General: She is not in acute distress.    Appearance: She is well-developed and normal weight.  Eyes:     Pupils: Pupils are equal, round, and reactive to light.  Cardiovascular:     Rate and Rhythm: Normal rate and regular rhythm.     Heart sounds: Normal heart sounds. No murmur heard. Pulmonary:     Effort: Pulmonary effort is normal. No tachypnea or respiratory distress.     Breath sounds: Normal breath sounds.  Chest:     Chest wall: No tenderness.  Musculoskeletal:     Cervical back: Normal range of motion.  Skin:    Capillary Refill: Capillary refill takes less than 2 seconds.  Neurological:     General: No focal deficit present.     Mental Status: She is alert and oriented to person, place, and time.     Cranial Nerves: No cranial nerve deficit.  Psychiatric:        Mood and  Affect: Mood normal.        Behavior: Behavior normal.     ED Results / Procedures / Treatments   Labs (all labs ordered are listed, but only abnormal results are displayed) Labs Reviewed  BASIC METABOLIC PANEL - Abnormal; Notable for the following components:      Result Value   Chloride 97 (*)    All other components within normal limits  CBC - Abnormal; Notable for the following components:   RDW 11.4 (*)    All other components within normal limits  HEPATIC FUNCTION PANEL - Abnormal; Notable for the following components:   Total Bilirubin 1.3 (*)    Indirect Bilirubin 1.1 (*)    All other components within normal limits  LIPASE,  BLOOD  TROPONIN I (HIGH SENSITIVITY)  TROPONIN I (HIGH SENSITIVITY)    EKG EKG Interpretation Date/Time:  Friday December 26 2022 12:44:21 EDT Ventricular Rate:  82 PR Interval:  144 QRS Duration:  75 QT Interval:  368 QTC Calculation: 430 R Axis:   10  Text Interpretation: Sinus rhythm when compared to prior, similer appearance. No STEMI Confirmed by Theda Belfast ([id number]) on 12/26/2022 3:16:01 PM  Radiology DG Chest 2 View  Result Date: 12/26/2022 CLINICAL DATA:  Chest pain. EXAM: CHEST - 2 VIEW COMPARISON:  September 21, 2015. FINDINGS: The heart size and mediastinal contours are within normal limits. Both lungs are clear. The visualized skeletal structures are unremarkable. IMPRESSION: No active cardiopulmonary disease. Electronically Signed   By: Lupita Raider M.D.   On: 12/26/2022 15:09    Procedures Procedures    Medications Ordered in ED Medications  acetaminophen (TYLENOL) tablet 650 mg (650 mg Oral Given 12/26/22 1600)  alum & mag hydroxide-simeth (MAALOX/MYLANTA) 200-200-20 MG/5ML suspension 30 mL (30 mLs Oral Given 12/26/22 1601)    And  lidocaine (XYLOCAINE) 2 % viscous mouth solution 15 mL (15 mLs Oral Given 12/26/22 1601)    ED Course/ Medical Decision Making/ A&P                                 Medical Decision Making 62  y.o. female presenting with substernal chest pain radiating to the back.  On presentation vital signs stable.  On physical exam she appears uncomfortable but clinically stable.  No abdominal pain.  Differential for this presentation includes esophagitis exacerbated by vomiting, gastroenteritis, esophageal perforation, ACS, pancreatitis, cholecystitis.  History is concerning for esophageal perforation as she had an iatrogenic perforation several years ago after undergoing an EGD.  Discussed this with patient and potential for reinjury of the esophagus after vomiting.  Shared decision-making with patient to trial GI cocktail monitor her and if she improves to discharge home with close outpatient GI follow-up.  Reassuringly patient reports pain is not similar to prior perforation, chest x-ray is negative for air in the mediastinum, and she has not had hematemesis.  Ordered lipase and hepatic function panel to rule out cholecystitis and pancreatitis.  Low suspicion for ACS with normal troponin, EKG, chest x-ray.  Patient was given Tylenol and GI cocktail and on reassessment pain was much improved and she was ready to be discharged home.  Hepatic function panel and lipase within normal limits - with normal physical exam low suspicion for pancreatitis or cholecystitis.  Patient has an appointment with gastroenterology scheduled for later this month.  In the meantime instructed patient to increase Protonix to 40 mg and gave prescription for Carafate while she waits to see GI.  Patient in agreement with plan and will follow-up with the ED if her symptoms worsen.   Amount and/or Complexity of Data Reviewed Independent Historian: parent External Data Reviewed: labs and notes. Labs: ordered. Decision-making details documented in ED Course. Radiology: ordered. Decision-making details documented in ED Course. ECG/medicine tests: ordered. Decision-making details documented in ED Course.  Risk OTC drugs. Prescription  drug management.          Final Clinical Impression(s) / ED Diagnoses Final diagnoses:  Nonspecific chest pain    Rx / DC Orders ED Discharge Orders          Ordered    sucralfate (CARAFATE) 1 g tablet  3 times daily with meals & bedtime  12/26/22 1715              Glendale Chard, DO 12/26/22 1720    Tegeler, Canary Brim, MD 12/27/22 [phone]

## 2022-12-26 NOTE — Discharge Instructions (Addendum)
Increase your Protonix to 40 mg while you wait to see gastroenterology. You can also try taking Carafate to help sooth your esophagus. If you begin to have worsening symptoms or if your pain starts to feel like your prior perforation, please return to the ED for evaluation.

## 2023-01-20 NOTE — Progress Notes (Unsigned)
01/21/2023 [name] [id number] May 19, 1960  Referring provider: Jac Canavan, PA-C Primary GI doctor: Dr. Rhea Belton  ASSESSMENT AND PLAN:   Rectal pain/IBS-C/hemorrhoids with some fecal smearing Colonoscopy 2020 recall 2027 Ongoing fecal smearing despite improvement in rectal discomfort and stool consistency. Possible contributing factors include enlarged hemorrhoids and pelvic floor dysfunction. -Increase dietary fiber, add fiber -Consider referral to pelvic floor physical therapy. -Schedule appointment with Dr. Rhea Belton to discuss potential hemorrhoidal banding.  Gastroesophageal Reflux Disease with EOE Recent episode of severe burning sensation during vomiting and ongoing sensation of food getting stuck.  Last endoscopy in 2018. -Continue Protonix 20mg  twice daily. -Start budesonide slurry. -Schedule endoscopy to assess for need of dilation and evaluate esophageal condition. -Provide information on Dupixent as a potential treatment option for eosinophilic esophagitis and asthma after EGD.  Asthma Seasonal exacerbation. -discuss with PCP on flovent RX, check insurance coverage.   Patient Care Team: Tysinger, Kermit Balo, PA-C as PCP - General (Family Medicine) Croitoru, Rachelle Hora, MD as PCP - Cardiology (Cardiology) Elon Spanner Madelaine Etienne, MD as Consulting Physician (Obstetrics and Gynecology) Rhea Belton, Carie Caddy, MD as Consulting Physician (Gastroenterology) Marcelyn Bruins, MD as Consulting Physician (Allergy) Jerilee Field, MD as Consulting Physician (Urology)  HISTORY OF PRESENT ILLNESS: 62 y.o. female with a past medical history of GERD, esophageal stricture, SIBO, esophageal spasm, chronic back pain, depression, anxiety, IBS and others listed below presents for evaluation of rectal pain.   2015 EGD in [facility] with biopsies consistent with eosinophilic esophagitis with abundant eosinophilic infiltrates greater than 100 per high-power field and eosinophilic  microabscesses, savory dilated empirically for complaints of dysphagia and had a nonobstructive stricture.  Also noted to have small white plaques in the esophagus. 11/2016 EGD normal with biopsies showing no evidence of eosinophilic esophagitis, gastric biopsies with mild chronic gastritis, no H. pylori and small bowel biopsies were negative.  08/2018 colonoscopy with 2 small tubular adenomas otherwise normal.  Repeat recommended in 7 years. Recall 2027 something  08/23/2020 seen by Mike Gip for epigastric pain and regurgitation as well as dysphagia.  At that time suspected to have exacerbation of eosinophilic esophagitis.  At that time Pantoprazole increased to 40 mg twice a day for 4 weeks then decrease to 1 p.o. every morning if symptoms were better.  Started Budesonide slurry 2 mg per 10 mL - 5 cc twice daily, changed to once daily dosing due to patient not being able to sleep if you take it at night.  Discussed EGD if symptoms persisted. 03/27/2022 OV [name] having some fecal smearing/lower abdominal discomfort improved with fiber thought to be IBS.  Was also having esophageal spasm relieved with budesonide slurry 11/05/2022 office visit with myself for rectal pain, normal colon 2020 recall 2027 negative Hemoccult, hard large stool burden. Sed rate negative, thyroid normal, CBC without anemia, c-Met normal kidneys liver.  Given hydrocortisone MiraLAX with fiber. 12/26/2022 ER visit at [facility] for chest pain radiates to her back worse after dinner with nausea and vomiting sharp chest pain.  Improved with GI cocktail chest x-ray negative for air, lipase and liver function negative normal troponin, EKG and chest x-ray. Discussed the use of AI scribe software for clinical note transcription with the patient, who gave verbal consent to proceed.  The patient, with a history of gastroesophageal reflux disease (GERD), presented with a recent episode of severe vomiting and epigastric pain.  The patient reported a burning sensation from the stomach to the back of the throat  during the episode. The patient also reported an increase in reflux symptoms recently, despite being on Protonix 20mg  daily, since the ER increased to twice a day. The patient expressed concern about the long-term use of Protonix due to potential bone health implications. The patient also reported difficulty swallowing, with a sensation of food getting stuck but eventually passing down. The patient denied any instances of food impaction. The patient's last endoscopy was in 2018.  The patient also reported a history of fecal incontinence, described as a small amount of fecal 'smearing' a few hours after bowel movements. The patient noted an improvement in rectal discomfort and firmer stools after the introduction of MiraLAX and fiber to her regimen. However, the fecal incontinence persisted.  The patient also mentioned a history of asthma, which typically worsens around May and November. The patient expressed concern about the availability of Flovent, her usual asthma medication, due to potential insurance coverage issues.  She denies blood thinner use.  She denies tobacco use.  She denies drug use.    She  reports that she has never smoked. She has never used smokeless tobacco. She reports current alcohol use of about 4.0 standard drinks of alcohol per week. She reports that she does not use drugs.  RELEVANT LABS AND IMAGING: CBC    Component Value Date/Time   WBC 9.8 12/26/2022 1255   RBC 4.19 12/26/2022 1255   HGB 13.4 12/26/2022 1255   HGB 12.8 01/27/2022 0925   HCT 38.8 12/26/2022 1255   HCT 38.0 01/27/2022 0925   PLT 308 12/26/2022 1255   PLT 351 01/27/2022 0925   MCV 92.6 12/26/2022 1255   MCV 93 01/27/2022 0925   MCH 32.0 12/26/2022 1255   MCHC 34.5 12/26/2022 1255   RDW 11.4 (L) 12/26/2022 1255   RDW 11.6 (L) 01/27/2022 0925   LYMPHSABS 1.8 11/05/2022 1425   LYMPHSABS 1.5 01/27/2022 0925    MONOABS 0.4 11/05/2022 1425   EOSABS 0.4 11/05/2022 1425   EOSABS 0.2 01/27/2022 0925   BASOSABS 0.1 11/05/2022 1425   BASOSABS 0.1 01/27/2022 0925   Recent Labs    01/27/22 0925 11/05/22 1425 12/26/22 1255  HGB 12.8 13.3 13.4    CMP     Component Value Date/Time   NA 136 12/26/2022 1255   NA 139 01/27/2022 0925   K 4.0 12/26/2022 1255   CL 97 (L) 12/26/2022 1255   CO2 25 12/26/2022 1255   GLUCOSE 99 12/26/2022 1255   BUN 20 12/26/2022 1255   BUN 15 01/27/2022 0925   CREATININE 0.93 12/26/2022 1255   CREATININE 0.74 10/21/2016 0812   CALCIUM 9.2 12/26/2022 1255   PROT 6.9 12/26/2022 1539   PROT 6.7 01/27/2022 0925   ALBUMIN 4.0 12/26/2022 1539   ALBUMIN 4.7 01/27/2022 0925   AST 23 12/26/2022 1539   ALT 21 12/26/2022 1539   ALKPHOS 63 12/26/2022 1539   BILITOT 1.3 (H) 12/26/2022 1539   BILITOT 0.5 01/27/2022 0925   GFRNONAA >60 12/26/2022 1255   GFRAA >60 09/04/2019 1647      Latest Ref Rng & Units 12/26/2022    3:39 PM 11/05/2022    2:25 PM 01/27/2022    9:25 AM  Hepatic Function  Total Protein 6.5 - 8.1 g/dL 6.9  7.0  6.7   Albumin 3.5 - 5.0 g/dL 4.0  4.4  4.7   AST 15 - 41 U/L 23  17  17    ALT 0 - 44 U/L 21  15  12  Alk Phosphatase 38 - 126 U/L 63  77  72   Total Bilirubin 0.3 - 1.2 mg/dL 1.3  0.4  0.5   Bilirubin, Direct 0.0 - 0.2 mg/dL 0.2         Current Medications:   Current Outpatient Medications (Endocrine & Metabolic):    estradiol (VIVELLE-DOT) 0.025 MG/24HR, Place 1 patch onto the skin 2 (two) times a week.    progesterone (PROMETRIUM) 200 MG capsule, Take 200 mg by mouth daily.    Current Outpatient Medications (Respiratory):    fluticasone (FLONASE) 50 MCG/ACT nasal spray, Place 2 sprays into both nostrils daily.   fluticasone (FLOVENT HFA) 220 MCG/ACT inhaler, Inhale 2 puffs into the lungs as needed. Inhale two puffs one to two times daily to prevent cough or wheeze. Rinse, gargle, and spit after use.   VENTOLIN HFA 108 (90 Base) MCG/ACT  inhaler, TAKE 2 PUFFS BY MOUTH EVERY 6 HOURS AS NEEDED FOR WHEEZE OR SHORTNESS OF BREATH  Current Outpatient Medications (Analgesics):    diclofenac (VOLTAREN) 75 MG EC tablet, Take 1 tablet (75 mg total) by mouth 2 (two) times daily.   Current Outpatient Medications (Other):    AMBULATORY NON FORMULARY MEDICATION, Budesonide slurry 2mg /82ml  Sig: 5cc daily x 8 weeks   betamethasone dipropionate 0.05 % cream, Apply topically 2 (two) times daily as needed.   cholecalciferol (VITAMIN D3) 25 MCG (1000 UT) tablet, Take 2 tablets (2,000 Units total) by mouth daily.   hydrocortisone (ANUSOL-HC) 2.5 % rectal cream, Place 1 Application rectally 2 (two) times daily.   hydrocortisone (ANUSOL-HC) 25 MG suppository, Place 1 suppository (25 mg total) rectally 2 (two) times daily.   LORazepam (ATIVAN) 0.5 MG tablet, Take 1 tablet (0.5 mg total) by mouth 2 (two) times daily as needed for anxiety.   methocarbamol (ROBAXIN) 500 MG tablet, Take 1 tablet (500 mg total) by mouth 2 (two) times daily as needed (back pain).   pantoprazole (PROTONIX) 20 MG tablet, TAKE ONE TABLET BY MOUTH ONE TIME DAILY   sucralfate (CARAFATE) 1 g tablet, Take 1 tablet (1 g total) by mouth 4 (four) times daily -  with meals and at bedtime.  Medical History:  Past Medical History:  Diagnosis Date   Allergy    Anxiety    Asthma    Chronic back pain    Depression    Eosinophilic esophagitis    Esophageal spasm    Esophageal stricture    Gastritis    GERD (gastroesophageal reflux disease)    Headache    occasional   IBS (irritable bowel syndrome)    Kidney stone 2015   gets every few years, hx/o calcium oxalate stones   Osteoporosis    Scoliosis    Seasonal allergic conjunctivitis    Seasonal allergic rhinitis    Small intestinal bacterial overgrowth    Vitamin D deficiency    Wears glasses    Allergies: No Known Allergies   Surgical History:  She  has a past surgical history that includes Appendectomy; Esophageal  dilation; Esophagoplasty (07/2014); Colonoscopy (2013); Kidney stone surgery (1986); Lithotripsy; Incision and drainage abscess (Bilateral, 09/14/2015); Breast excisional biopsy; Ganglion cyst excision (Right, 12/31/2017); and Colonoscopy (08/2018). Family History:  Her family history includes Barrett's esophagus in her mother and paternal grandfather; Diabetes in her brother, father, and sister; Headache in her brother; Heart attack in her father; Heart disease in her maternal grandfather; Heart disease (age of onset: 54) in her maternal grandmother; Heart disease (age of onset: 45) in  her father; Hypertension in her father; Kidney Stones in her brother and father; Migraines in her brother; Urolithiasis in her brother.  REVIEW OF SYSTEMS  : All other systems reviewed and negative except where noted in the History of Present Illness.  PHYSICAL EXAM: BP 110/70 (BP Location: Left Arm, Patient Position: Sitting, Cuff Size: Normal)   Pulse 88   Ht 5\' 2"  (1.575 m)   Wt 109 lb 2 oz (49.5 kg)   LMP 10/31/2016   BMI 19.96 kg/m  General Appearance: Well nourished, in no apparent distress. Head:   Normocephalic and atraumatic. Eyes:  sclerae anicteric,conjunctive pink  Respiratory: Respiratory effort normal, BS equal bilaterally without rales, rhonchi, wheezing. Cardio: RRR with no MRGs. Peripheral pulses intact.  Abdomen: Soft,  Non-distended ,active bowel sounds. No tenderness . Without guarding and Without rebound. No masses. Rectal: declines Musculoskeletal: Full ROM, Normal gait. Without edema. Skin:  Dry and intact without significant lesions or rashes Neuro: Alert and  oriented x4;  No focal deficits. Psych:  Cooperative. Normal mood and affect.    Doree Albee, PA-C 3:37 PM

## 2023-01-21 ENCOUNTER — Encounter: Payer: Self-pay | Admitting: Physician Assistant

## 2023-01-21 ENCOUNTER — Ambulatory Visit: Payer: BC Managed Care – PPO | Admitting: Physician Assistant

## 2023-01-21 VITALS — BP 110/70 | HR 88 | Ht 62.0 in | Wt 109.1 lb

## 2023-01-21 DIAGNOSIS — K6289 Other specified diseases of anus and rectum: Secondary | ICD-10-CM

## 2023-01-21 DIAGNOSIS — K581 Irritable bowel syndrome with constipation: Secondary | ICD-10-CM

## 2023-01-21 DIAGNOSIS — K200 Eosinophilic esophagitis: Secondary | ICD-10-CM

## 2023-01-21 DIAGNOSIS — K219 Gastro-esophageal reflux disease without esophagitis: Secondary | ICD-10-CM

## 2023-01-21 MED ORDER — AMBULATORY NON FORMULARY MEDICATION: 0 refills | Status: DC

## 2023-01-21 NOTE — Patient Instructions (Addendum)
Dupixent (dupilumab) therapy approved for Eosinophilic esophagitis  (Should not receive a live vaccine, planning on becoming pregnant)  -300 mg weekly SQ injectable that is a biologic.  -In clinical trials to pick sent reduced eosinophils in the esophagus, and help decrease symptoms. -The most common side effects or injection site reactions, upper respiratory infections, cold sores and joint pains. -Please notify us right away if you have an allergic reaction to Dupixent such as breathing problems, wheezing, swelling of your lips mouth face, tongue, hives, general ill feeling.  Please notify us if you have trouble walking or moving your joints, very rare cases I need to have hospitalization. -Would suggest getting flu vaccinations yearly, do not get any live vaccinations.  I encourage you to go to sites below or sign up for Dupixent my way  -You will receive a welcome call from a nurse educator who will help share resources and tools.  You can get a copay card from online or if you enroll in dupixent my way.  1) dupixent.com  2) call 184 for Dupixent  You have been scheduled for an endoscopy. Please follow written instructions given to you at your visit today.  If you use inhalers (even only as needed), please bring them with you on the day of your procedure.  If you take any of the following medications, they will need to be adjusted prior to your procedure:   DO NOT TAKE 7 DAYS PRIOR TO TEST- Trulicity (dulaglutide) Ozempic, Wegovy (semaglutide) Mounjaro (tirzepatide) Bydureon Bcise (exanatide extended release)  DO NOT TAKE 1 DAY PRIOR TO YOUR TEST Rybelsus (semaglutide) Adlyxin (lixisenatide) Victoza (liraglutide) Byetta (exanatide) ___________________________________________________________________________    _______________________________________________________  If your blood pressure at your visit was 140/90 or greater, please contact your primary care physician to  follow up on this.  _______________________________________________________  If you are age 75 or older, your body mass index should be between 23-30. Your Body mass index is 19.96 kg/m. If this is out of the aforementioned range listed, please consider follow up with your Primary Care Provider.  If you are age 70 or younger, your body mass index should be between 19-25. Your Body mass index is 19.96 kg/m. If this is out of the aformentioned range listed, please consider follow up with your Primary Care Provider.   ________________________________________________________  The [facility] providers would like to encourage you to use [facility] to communicate with providers for non-urgent requests or questions.  Due to long hold times on the telephone, sending your provider a message by [facility] may be a faster and more efficient way to get a response.  Please allow 48 business hours for a response.  Please remember that this is for non-urgent requests.  _______________________________________________________ It was a pleasure to see you today!  Thank you for trusting me with your gastrointestinal care!

## 2023-01-22 NOTE — Progress Notes (Signed)
Addendum: Reviewed and agree with assessment and management plan. Would proceed with referral for pelvic floor physical therapy Kyla Duffy, Carie Caddy, MD

## 2023-01-30 ENCOUNTER — Ambulatory Visit (INDEPENDENT_AMBULATORY_CARE_PROVIDER_SITE_OTHER): Payer: BC Managed Care – PPO | Admitting: Medical

## 2023-01-30 ENCOUNTER — Encounter: Payer: Self-pay | Admitting: Medical

## 2023-01-30 VITALS — BP 104/68 | HR 73 | Ht 62.0 in | Wt 107.0 lb

## 2023-01-30 DIAGNOSIS — F419 Anxiety disorder, unspecified: Secondary | ICD-10-CM

## 2023-01-30 DIAGNOSIS — M810 Age-related osteoporosis without current pathological fracture: Secondary | ICD-10-CM

## 2023-01-30 DIAGNOSIS — Z0000 Encounter for general adult medical examination without abnormal findings: Secondary | ICD-10-CM

## 2023-01-30 DIAGNOSIS — Z13220 Encounter for screening for lipoid disorders: Secondary | ICD-10-CM | POA: Diagnosis not present

## 2023-01-30 DIAGNOSIS — J4530 Mild persistent asthma, uncomplicated: Secondary | ICD-10-CM | POA: Diagnosis not present

## 2023-01-30 DIAGNOSIS — K219 Gastro-esophageal reflux disease without esophagitis: Secondary | ICD-10-CM

## 2023-01-30 DIAGNOSIS — Z7185 Encounter for immunization safety counseling: Secondary | ICD-10-CM

## 2023-01-30 MED ORDER — ALBUTEROL SULFATE HFA 108 (90 BASE) MCG/ACT IN AERS: 2.0000 | INHALATION_SPRAY | Freq: Four times a day (QID) | RESPIRATORY_TRACT | 2 refills | Status: AC | PRN

## 2023-01-30 MED ORDER — LORAZEPAM 0.5 MG PO TABS: 0.5000 mg | ORAL_TABLET | Freq: Two times a day (BID) | ORAL | 1 refills | Status: AC | PRN

## 2023-01-30 MED ORDER — FLUTICASONE-SALMETEROL 115-21 MCG/ACT IN AERO: 2.0000 | INHALATION_SPRAY | Freq: Two times a day (BID) | RESPIRATORY_TRACT | 12 refills | Status: AC

## 2023-01-30 NOTE — Addendum Note (Signed)
Addended by: Jac Canavan on: 01/30/2023 09:17 AM   Modules accepted: Orders

## 2023-01-30 NOTE — Progress Notes (Addendum)
Subjective:   HPI  [name] is a 62 y.o. female who presents for Chief Complaint  Patient presents with   Annual Exam    Fasting Cpe, no concerns    Patient Care Team: Harvie Morua, Kermit Balo, PA-C as PCP - General (Family Medicine) Croitoru, Rachelle Hora, MD as PCP - Cardiology (Cardiology) Elon Spanner Madelaine Etienne, MD as Consulting Physician (Obstetrics and Gynecology) Rhea Belton, Carie Caddy, MD as Consulting Physician (Gastroenterology) Marcelyn Bruins, MD as Consulting Physician (Allergy) Jerilee Field, MD as Consulting Physician (Urology) Dermatology Sees dentist:Dr. Adonis Huguenin eye doctor: Loann Quill   Concerns: Osteoporosis-currently on hormones , has been very reluctant to other medications given risks.  She is doing aerobic and weightbearing exercise.  Asthma-uses inhaler this time of year, needs her maintennce inhaler.  Had issues with insurance coverage last year  Reviewed their medical, surgical, family, social, medication, and allergy history and updated chart as appropriate.  Past Medical History:  Diagnosis Date   Allergy    Anxiety    Asthma    Chronic back pain    Depression    Eosinophilic esophagitis    Esophageal spasm    Esophageal stricture    Gastritis    GERD (gastroesophageal reflux disease)    Headache    occasional   IBS (irritable bowel syndrome)    Kidney stone 2015   gets every few years, hx/o calcium oxalate stones   Osteoporosis    Scoliosis    Seasonal allergic conjunctivitis    Seasonal allergic rhinitis    Small intestinal bacterial overgrowth    Vitamin D deficiency    Wears glasses     Past Surgical History:  Procedure Laterality Date   APPENDECTOMY     BREAST EXCISIONAL BIOPSY     left   COLONOSCOPY  2013   [place], [place]   COLONOSCOPY  08/2018   TA, diverticula   ESOPHAGEAL DILATION     several times prior, [place], [place]   ESOPHAGOPLASTY  07/2014   complication of esophageal dilitation, hospitalization at  [facility]   GANGLION CYST EXCISION Right 12/31/2017   Procedure: EXCISION RIGHT WRIST DORSAL GANGLION;  Surgeon: Betha Loa, MD;  Location: [facility];  Service: Orthopedics;  Laterality: Right;  Bier block   INCISION AND DRAINAGE ABSCESS Bilateral 09/14/2015   Procedure: INCISION AND DRAINAGE ABSCESS;  Surgeon: Betha Loa, MD;  Location:  SURGERY CENTER;  Service: Orthopedics;  Laterality: Bilateral;   KIDNEY STONE SURGERY  1986   LITHOTRIPSY     4x prior as of 05/2014      Family History  Problem Relation Age of Onset   Barrett's esophagus Mother    Diabetes Father    Kidney Stones Father    Hypertension Father    Heart attack Father    Heart disease Father 63       MI , stent   Diabetes Sister    Kidney Stones Brother    Migraines Brother    Urolithiasis Brother    Diabetes Brother    Headache Brother    Heart disease Maternal Grandfather        CABG   Barrett's esophagus Paternal Grandfather    Heart disease Maternal Grandmother 60       rheumatic related heart disesae   Cancer Neg Hx    Stroke Neg Hx    Colon cancer Neg Hx    Esophageal cancer Neg Hx    Stomach cancer Neg Hx    Rectal  cancer Neg Hx      Current Outpatient Medications:    AMBULATORY NON FORMULARY MEDICATION, Budesonide slurry 2mg /45ml  Sig: 5cc daily x 8 weeks, Disp: 300 mL, Rfl: 0   betamethasone dipropionate 0.05 % cream, Apply topically 2 (two) times daily as needed., Disp: , Rfl:    cholecalciferol (VITAMIN D3) 25 MCG (1000 UT) tablet, Take 2 tablets (2,000 Units total) by mouth daily., Disp: 180 tablet, Rfl: 3   diclofenac (VOLTAREN) 75 MG EC tablet, Take 1 tablet (75 mg total) by mouth 2 (two) times daily., Disp: 45 tablet, Rfl: 2   estradiol (VIVELLE-DOT) 0.025 MG/24HR, Place 1 patch onto the skin 2 (two) times a week. , Disp: , Rfl:    fluticasone (FLONASE) 50 MCG/ACT nasal spray, Place 2 sprays into both nostrils daily., Disp: 16 g, Rfl: 5   fluticasone (FLOVENT  HFA) 220 MCG/ACT inhaler, Inhale 2 puffs into the lungs as needed. Inhale two puffs one to two times daily to prevent cough or wheeze. Rinse, gargle, and spit after use., Disp: 1 each, Rfl: 1   fluticasone-salmeterol (ADVAIR HFA) 115-21 MCG/ACT inhaler, Inhale 2 puffs into the lungs 2 (two) times daily., Disp: 1 each, Rfl: 12   pantoprazole (PROTONIX) 20 MG tablet, TAKE ONE TABLET BY MOUTH ONE TIME DAILY, Disp: 90 tablet, Rfl: 1   progesterone (PROMETRIUM) 200 MG capsule, Take 200 mg by mouth daily. , Disp: , Rfl:    albuterol (VENTOLIN HFA) 108 (90 Base) MCG/ACT inhaler, Inhale 2 puffs into the lungs every 6 (six) hours as needed for wheezing or shortness of breath., Disp: 18 g, Rfl: 2   LORazepam (ATIVAN) 0.5 MG tablet, Take 1 tablet (0.5 mg total) by mouth 2 (two) times daily as needed for anxiety., Disp: 60 tablet, Rfl: 1   methocarbamol (ROBAXIN) 500 MG tablet, Take 1 tablet (500 mg total) by mouth 2 (two) times daily as needed (back pain). (Patient not taking: Reported on 01/30/2023), Disp: 45 tablet, Rfl: 2  No Known Allergies   Review of Systems  Constitutional:  Negative for chills, fever, malaise/fatigue and weight loss.  HENT:  Negative for congestion, ear pain, hearing loss, sore throat and tinnitus.   Eyes:  Negative for blurred vision, pain and redness.  Respiratory:  Positive for shortness of breath. Negative for cough and hemoptysis.   Cardiovascular:  Negative for chest pain, palpitations, orthopnea, claudication and leg swelling.  Gastrointestinal:  Negative for abdominal pain, blood in stool, constipation, diarrhea, nausea and vomiting.  Genitourinary:  Negative for dysuria, flank pain, frequency, hematuria and urgency.  Musculoskeletal:  Negative for falls, joint pain and myalgias.  Skin:  Negative for itching and rash.  Neurological:  Negative for dizziness, tingling, speech change, weakness and headaches.  Endo/Heme/Allergies:  Negative for polydipsia. Does not bruise/bleed  easily.  Psychiatric/Behavioral:  Negative for depression and memory loss. The patient is not nervous/anxious and does not have insomnia.          Objective:  BP 104/68   Pulse 73   Ht 5\' 2"  (1.575 m)   Wt 107 lb (48.5 kg)   LMP 10/31/2016   BMI 19.57 kg/m   Wt Readings from Last 3 Encounters:  01/30/23 107 lb (48.5 kg)  01/21/23 109 lb 2 oz (49.5 kg)  12/26/22 103 lb (46.7 kg)     General appearence: alert, no distress, WD/WN, lean white female Skin: Scattered macules, no worrisome lesions  HEENT: normocephalic, sclerae anicteric, PERRLA, EOMi, nares patent, no discharge or erythema, pharynx  normal Oral cavity: MMM, no lesions Neck: supple, no lymphadenopathy, no thyromegaly, no masses Heart: RRR, normal S1, S2, no murmurs Lungs: CTA bilaterally, no wheezes, rhonchi, or rales Abdomen: +bs, soft, non tender, non distended, no masses, no hepatomegaly, no splenomegaly Back: non tender, scoliosis noted Musculoskeletal: nontender, no swelling, positive scoliosis Extremities: no edema, no cyanosis, no clubbing Pulses: 2+ symmetric, upper and lower extremities, normal cap refill Neurological: alert, oriented x 3, CN2-12 intact, strength normal upper extremities and lower extremities, sensation normal throughout, DTRs 2+ throughout, no cerebellar signs, gait normal Psychiatric: normal affect, behavior normal, pleasant  Breast, GYN, rectal-deferred to gynecology    Assessment and Plan :   Encounter Diagnoses  Name Primary?   Routine general medical examination at a health care facility Yes   Screening for lipid disorders    Anxiety    Mild persistent asthma without complication    Gastroesophageal reflux disease without esophagitis    Osteoporosis, unspecified osteoporosis type, unspecified pathological fracture presence    Vaccine counseling      This visit was a preventative care visit, also known as wellness visit or routine physical.   Topics typically include  healthy lifestyle, diet, exercise, preventative care, vaccinations, sick and well care, proper use of emergency dept and after hours care, as well as other concerns.     Recommendations: Continue to return yearly for your annual wellness and preventative care visits.  This gives [place] a chance to discuss healthy lifestyle, exercise, vaccinations, review your chart record, and perform screenings where appropriate.  I recommend you see your eye doctor yearly for routine vision care.  I recommend you see your dentist yearly for routine dental care including hygiene visits twice yearly.   Vaccination recommendations were reviewed Immunization History  Administered Date(s) Administered   Influenza,inj,Quad PF,6+ Mos 01/25/2021   Influenza-Unspecified 02/03/2011, 12/22/2021, 02/04/2022   Pfizer(Comirnaty)Fall Seasonal Vaccine 12 years and older 01/27/2022   Tdap 09/13/2015    Vaccine recommended include yearly flu shot, shingrix. Declines vaccines today  Screening for cancer: Colon cancer screening: I reviewed your colonoscopy on file that is up to date from 08/2018.  Repeat in 2027.  Breast cancer screening: You should perform a self breast exam monthly.   We reviewed recommendations for regular mammograms and breast cancer screening.  Cervical cancer screening: We reviewed recommendations for pap smear screening.   Skin cancer screening: Check your skin regularly for new changes, growing lesions, or other lesions of concern Come in for evaluation if you have skin lesions of concern.  Lung cancer screening: If you have a greater than 20 pack year history of tobacco use, then you may qualify for lung cancer screening with a chest CT scan.   Please call your insurance company to inquire about coverage for this test.  We currently don't have screenings for other cancers besides breast, cervical, colon, and lung cancers.  If you have a strong family history of cancer or have other cancer  screening concerns, please let me know.    Bone health: Get at least 150 minutes of aerobic exercise weekly Get weight bearing exercise at least once weekly Bone density test:  A bone density test is an imaging test that uses a type of X-ray to measure the amount of calcium and other minerals in your bones. The test may be used to diagnose or screen you for a condition that causes weak or thin bones (osteoporosis), predict your risk for a broken bone (fracture), or determine how well your  osteoporosis treatment is working. The bone density test is recommended for females 65 and older, or females or males <65 if certain risk factors such as thyroid disease, long term use of steroids such as for asthma or rheumatological issues, vitamin D deficiency, estrogen deficiency, family history of osteoporosis, self or family history of fragility fracture in first degree relative.  Osteoporosis on 11/2022 bone density study   Heart health: Get at least 150 minutes of aerobic exercise weekly Limit alcohol It is important to maintain a healthy blood pressure and healthy cholesterol numbers  Heart disease screening: Screening for heart disease includes screening for blood pressure, fasting lipids, glucose/diabetes screening, BMI height to weight ratio, reviewed of smoking status, physical activity, and diet.    Goals include blood pressure 120/80 or less, maintaining a healthy lipid/cholesterol profile, preventing diabetes or keeping diabetes numbers under good control, not smoking or using tobacco products, exercising most days per week or at least 150 minutes per week of exercise, and eating healthy variety of fruits and vegetables, healthy oils, and avoiding unhealthy food choices like fried food, fast food, high sugar and high cholesterol foods.    Other tests may possibly include EKG test, CT coronary calcium score, echocardiogram, exercise treadmill stress test.    CT coronary heart tests  06/2021: IMPRESSION: 1. Coronary calcium score of 0. This was 1st percentile for age, gender, and race matched controls.    Medical care options: I recommend you continue to seek care here first for routine care.  We try really hard to have available appointments Monday through Friday daytime hours for sick visits, acute visits, and physicals.  Urgent care should be used for after hours and weekends for significant issues that cannot wait till the next day.  The emergency department should be used for significant potentially life-threatening emergencies.  The emergency department is expensive, can often have long wait times for less significant concerns, so try to utilize primary care, urgent care, or telemedicine when possible to avoid unnecessary trips to the emergency department.  Virtual visits and telemedicine have been introduced since the pandemic started in 2020, and can be convenient ways to receive medical care.  We offer virtual appointments as well to assist you in a variety of options to seek medical care.   Advanced Directives: I recommend you consider completing a Health Care Power of Attorney and Living Will.   These documents respect your wishes and help alleviate burdens on your loved ones if you were to become terminally ill or be in a position to need those documents enforced.    You can complete Advanced Directives yourself, have them notarized, then have copies made for our office, for you and for anybody you feel should have them in safe keeping.  Or, you can have an attorney prepare these documents.   If you haven't updated your Last Will and Testament in a while, it may be worthwhile having an attorney prepare these documents together and save on some costs.        Separate significant issues discussed: I reviewed her recent blood work from October 2024 in the chart record.  No major worrisome findings with the liver, kidney, thyroid, electrolytes.  Asthma and  allergies-begin trial of Advair.  Discussed proper use of medication.  Continue albuterol as needed   Vitamin D deficiency -continue supplement, counseled on diet, sun exposure  Anxiety, concentration deficit-uses lorazepam as needed.  GERD-discussed risk and benefits of medicine, does not tolerate being off PPI  Osteoporosis-discussed weightbearing exercise, continue hormone therapy through gynecology, discussed other medication options and possible referral to endocrinology if she wants further discussion on options   Rebecca "Andrey Campanile" was seen today for annual exam.  Diagnoses and all orders for this visit:  Routine general medical examination at a health care facility -     Cancel: NMR, lipoprofile -     NMR, lipoprofile  Screening for lipid disorders -     Cancel: NMR, lipoprofile -     NMR, lipoprofile  Anxiety  Mild persistent asthma without complication  Gastroesophageal reflux disease without esophagitis  Osteoporosis, unspecified osteoporosis type, unspecified pathological fracture presence  Vaccine counseling  Other orders -     fluticasone-salmeterol (ADVAIR HFA) 115-21 MCG/ACT inhaler; Inhale 2 puffs into the lungs 2 (two) times daily. -     albuterol (VENTOLIN HFA) 108 (90 Base) MCG/ACT inhaler; Inhale 2 puffs into the lungs every 6 (six) hours as needed for wheezing or shortness of breath. -     LORazepam (ATIVAN) 0.5 MG tablet; Take 1 tablet (0.5 mg total) by mouth 2 (two) times daily as needed for anxiety.   Follow-up pending labs, yearly for physical

## 2023-01-31 LAB — NMR, LIPOPROFILE
Cholesterol, Total: 192 mg/dL (ref 100–199)
HDL Particle Number: 38.3 umol/L (ref >=30.5)
HDL-C: 90 mg/dL (ref >39)
LDL Particle Number: 948 nmol/L (ref <1000)
LDL Size: 21.2 nm (ref >20.5)
LDL-C (NIH Calc): 92 mg/dL (ref 0–99)
LP-IR Score: <25 (ref <=45)
Small LDL Particle Number: <90 nmol/L (ref <=527)
Triglycerides: 55 mg/dL (ref 0–149)

## 2023-02-01 NOTE — Progress Notes (Signed)
Results sent through MyChart

## 2023-02-16 ENCOUNTER — Other Ambulatory Visit: Payer: Self-pay

## 2023-02-16 ENCOUNTER — Telehealth: Payer: Self-pay | Admitting: Physician Assistant

## 2023-02-16 DIAGNOSIS — R1032 Left lower quadrant pain: Secondary | ICD-10-CM

## 2023-02-16 MED ORDER — DICYCLOMINE HCL 20 MG PO TABS: 20.0000 mg | ORAL_TABLET | Freq: Three times a day (TID) | ORAL | 0 refills | Status: AC | PRN

## 2023-02-16 NOTE — Telephone Encounter (Signed)
Schedule CT AB and pelvis with contrast stat for possible diverticulitis, can take dicyclomine ( [facility] to [facility]) as needed for pain, IBGARD, soft diet. Add on miralax one capful daily.   Advised to go to the ER if there is any severe abdominal pain, unable to hold down food/water, blood in stool or vomit, chest pain, shortness of breath, or any worsening symptoms.

## 2023-02-16 NOTE — Telephone Encounter (Signed)
Pt scheduled for CT scan. Left message for pt that the prescription for bentyl has been sent in for her for the abdominal discomfort.

## 2023-02-16 NOTE — Telephone Encounter (Signed)
Inbound call from patient stating that she has a severe pain in her abdomen and is having constipation. Patient is requesting a call to discuss further. Please advise, thank you.

## 2023-02-16 NOTE — Telephone Encounter (Signed)
Pt states she started having a little pain in her LLQ on Saturday,states on Sunday during the day it got a little better, but last night the pain kept her up at night. She reports the spot in her LLQ if you press on it is very painful. On a scale of 1-10 she rates it at a 7. The pain is pretty constant, states she does not think she has a fever. Last good BM was a week ago. Reports she has passed a tiny amount of poop. Asked pt if she has ever had diverticulitis and she states she has not. Please advise.

## 2023-02-18 ENCOUNTER — Telehealth: Payer: Self-pay | Admitting: Physician Assistant

## 2023-02-18 ENCOUNTER — Other Ambulatory Visit: Payer: Self-pay | Admitting: Physician Assistant

## 2023-02-18 ENCOUNTER — Ambulatory Visit
Admission: RE | Admit: 2023-02-18 | Discharge: 2023-02-18 | Disposition: A | Discharge status: Home / Self Care | Payer: BC Managed Care – PPO | Source: Ambulatory Visit | Attending: Physician Assistant | Admitting: Physician Assistant

## 2023-02-18 DIAGNOSIS — K5792 Diverticulitis of intestine, part unspecified, without perforation or abscess without bleeding: Secondary | ICD-10-CM

## 2023-02-18 DIAGNOSIS — R1032 Left lower quadrant pain: Secondary | ICD-10-CM | POA: Diagnosis not present

## 2023-02-18 DIAGNOSIS — N200 Calculus of kidney: Secondary | ICD-10-CM | POA: Diagnosis not present

## 2023-02-18 DIAGNOSIS — K5732 Diverticulitis of large intestine without perforation or abscess without bleeding: Secondary | ICD-10-CM | POA: Diagnosis not present

## 2023-02-18 DIAGNOSIS — R197 Diarrhea, unspecified: Secondary | ICD-10-CM | POA: Diagnosis not present

## 2023-02-18 MED ORDER — IOPAMIDOL (ISOVUE-300) INJECTION 61%: 100.0000 mL | Freq: Once | INTRAVENOUS | Status: DC | PRN

## 2023-02-18 MED ORDER — AMOXICILLIN-POT CLAVULANATE 875-125 MG PO TABS: 1.0000 | ORAL_TABLET | Freq: Two times a day (BID) | ORAL | 0 refills | Status: DC

## 2023-02-18 NOTE — Telephone Encounter (Signed)
Has some AB pain still, diarrhea, some chills on Monday but no objective fever. She has been on soft diet, no nausea, vomiting.  Given ER precautions and low threshold to go to the ER if not responding to the oral ABX for IV ABX.

## 2023-02-25 NOTE — Telephone Encounter (Signed)
Patient already has an appt scheduled with Dr. Rhea Belton 04/02/23 at 8:50am.

## 2023-02-27 ENCOUNTER — Other Ambulatory Visit: Payer: BC Managed Care – PPO

## 2023-02-28 DIAGNOSIS — N200 Calculus of kidney: Secondary | ICD-10-CM | POA: Diagnosis not present

## 2023-02-28 DIAGNOSIS — K5732 Diverticulitis of large intestine without perforation or abscess without bleeding: Secondary | ICD-10-CM | POA: Diagnosis not present

## 2023-02-28 DIAGNOSIS — R109 Unspecified abdominal pain: Secondary | ICD-10-CM | POA: Diagnosis not present

## 2023-02-28 DIAGNOSIS — K5720 Diverticulitis of large intestine with perforation and abscess without bleeding: Secondary | ICD-10-CM | POA: Diagnosis not present

## 2023-02-28 DIAGNOSIS — R188 Other ascites: Secondary | ICD-10-CM | POA: Diagnosis not present

## 2023-02-28 DIAGNOSIS — M545 Low back pain, unspecified: Secondary | ICD-10-CM | POA: Diagnosis not present

## 2023-02-28 DIAGNOSIS — R935 Abnormal findings on diagnostic imaging of other abdominal regions, including retroperitoneum: Secondary | ICD-10-CM | POA: Diagnosis not present

## 2023-02-28 DIAGNOSIS — K5792 Diverticulitis of intestine, part unspecified, without perforation or abscess without bleeding: Secondary | ICD-10-CM | POA: Diagnosis not present

## 2023-03-03 DIAGNOSIS — H16223 Keratoconjunctivitis sicca, not specified as Sjogren's, bilateral: Secondary | ICD-10-CM | POA: Diagnosis not present

## 2023-03-09 ENCOUNTER — Encounter: Payer: Self-pay | Admitting: Internal Medicine

## 2023-03-24 ENCOUNTER — Encounter: Payer: BC Managed Care – PPO | Admitting: Internal Medicine

## 2023-04-02 ENCOUNTER — Encounter: Payer: Self-pay | Admitting: Internal Medicine

## 2023-04-02 ENCOUNTER — Ambulatory Visit: Payer: BC Managed Care – PPO | Admitting: Internal Medicine

## 2023-04-02 VITALS — BP 120/60 | HR 56 | Ht 62.0 in | Wt 110.0 lb

## 2023-04-02 DIAGNOSIS — K5732 Diverticulitis of large intestine without perforation or abscess without bleeding: Secondary | ICD-10-CM

## 2023-04-02 DIAGNOSIS — K200 Eosinophilic esophagitis: Secondary | ICD-10-CM | POA: Diagnosis not present

## 2023-04-02 DIAGNOSIS — K219 Gastro-esophageal reflux disease without esophagitis: Secondary | ICD-10-CM

## 2023-04-02 DIAGNOSIS — R1310 Dysphagia, unspecified: Secondary | ICD-10-CM

## 2023-04-02 DIAGNOSIS — Z860100 Personal history of colon polyps, unspecified: Secondary | ICD-10-CM

## 2023-04-02 DIAGNOSIS — Z860101 Personal history of adenomatous and serrated colon polyps: Secondary | ICD-10-CM

## 2023-04-02 MED ORDER — SUTAB 1479-225-188 MG PO TABS: 24.0000 | ORAL_TABLET | ORAL | 0 refills | Status: DC

## 2023-04-02 NOTE — Progress Notes (Signed)
 Subjective:    Patient ID: [name], female    DOB: [birth year], 63 y.o.   MRN: [id number]  HPI Sharyl Panchal is a 63 year old female with a history of GERD, EoE, nonadvanced adenomatous polyps of the colon, diverticulosis with recent diverticulitis who is here for follow-up.  She was seen here in late November by Alan Coombs, PA-C and had a CT scan which revealed diverticulitis.    She reports a recent episode of diverticulitis, which was treated with two courses of antibiotics.  She was having significant left lower quadrant abdominal pain.  The first course was prescribed by Alan Coombs, PA-C for Augmentin  x 14 days and the second by an ER physician after the symptoms recurred.  The symptoms had improved but then about day 9 while still on Augmentin  symptoms recurred and she went back to the ER.  Repeat CT scan showed continued diverticulitis without complication and she was represcribed an additional 14 days of Augmentin  which she completed.  Since this time no current abdominal pain or other symptoms related to diverticulitis.  The patient's EOE has been managed with pantoprazole  twice daily for several years, with intermittent use of budesonide  during flare-ups. She reports a recent flare-up of EOE symptoms, including pain and difficulty swallowing solid foods, even softer ones like eggs and toast. The patient has restarted budesonide  and notes a significant improvement in symptoms with its use. She also reports a sensation of spasms or quivering in the esophagus.  The patient has a history of food allergies and notes that certain foods, such as dairy and chicken, seem to exacerbate her EOE symptoms. She has been taking calcium  supplements and trying to reduce her intake of dairy products. She has not noticed any blood in her stool or other alarming symptoms.   Review of Systems As per HPI, otherwise negative  Current Medications, Allergies, Past Medical History, Past Surgical  History, Family History and Social History were reviewed in [employer] record.    Objective:   Physical Exam BP 120/60   Pulse (!) 56   Ht 5' 2 (1.575 m)   Wt 110 lb (49.9 kg)   LMP 10/31/2016   BMI 20.12 kg/m  Gen: awake, alert, NAD HEENT: anicteric  Ext: no c/c/e Neuro: nonfocal  CT ABDOMEN AND PELVIS WITH CONTRAST   TECHNIQUE:  Multidetector CT imaging of the abdomen and pelvis was performed  using the standard protocol following bolus administration of  intravenous contrast.   RADIATION DOSE REDUCTION: This exam was performed according to the  departmental dose-optimization program which includes automated  exposure control, adjustment of the mA and/or kV according to  patient size and/or use of iterative reconstruction technique.   CONTRAST:  80 mL iohexoL (OMNIPAQUE) 350 mg iodine/mL injection  (MDV) 80 mL   COMPARISON:  02/18/2023   FINDINGS:  Lower chest:   Unremarkable.   Hepatobiliary: No suspicious focal abnormality within the liver  parenchyma. Small area of low attenuation in the anterior liver,  adjacent to the falciform ligament, is in a characteristic location  for focal fatty deposition. There is no evidence for gallstones,  gallbladder wall thickening, or pericholecystic fluid. No  intrahepatic or extrahepatic biliary dilation.   Pancreas: No focal mass lesion. No dilatation of the main duct. No  intraparenchymal cyst. No peripancreatic edema.   Spleen: No splenomegaly. No suspicious focal mass lesion.   Adrenals/Urinary Tract: No adrenal nodule or mass. At least 4  nonobstructing stones are seen in the left  kidney ranging in size  from 2 mm up to a dominant 8 mm upper pole stone. 2 mm  nonobstructing stone identified interpolar left kidney No evidence  for hydroureter. The urinary bladder appears normal for the degree  of distention.   Stomach/Bowel: Stomach is unremarkable. No gastric wall thickening.  No evidence of  outlet obstruction. Duodenum is normally positioned  as is the ligament of Treitz. No small bowel wall thickening. No  small bowel dilatation. Surgical clips are noted in the ileocolic  mesentery. The terminal ileum is normal. The appendix is not well  visualized, but there is no edema or inflammation in the region of  the cecal tip to suggest appendicitis. Scattered diverticuli are  seen in the left colon. There is a segment of sigmoid colon  demonstrating circumferential edematous wall thickening with  pericolonic edema/inflammation and enlarged ill-defined diverticulum  (image 56/2). There is some fluid in the sigmoid mesocolon but no  evidence for extraluminal gas or left lower quadrant abscess.   Vascular/Lymphatic: No abdominal aortic aneurysm. No abdominal  aortic atherosclerotic calcification. There is no gastrohepatic or  hepatoduodenal ligament lymphadenopathy. No retroperitoneal or  mesenteric lymphadenopathy. No pelvic sidewall lymphadenopathy.   Reproductive: The uterus is unremarkable.  There is no adnexal mass.   Other: No intraperitoneal free fluid.   Musculoskeletal: No worrisome lytic or sclerotic osseous  abnormality.   IMPRESSION:  1. Acute sigmoid diverticulitis. No evidence for perforation or  abscess.  2. Bilateral nonobstructing renal stones.    Electronically Signed    By: Camellia Candle M.D.    On: 02/28/2023 05:52  CT ABDOMEN AND PELVIS WITHOUT CONTRAST   TECHNIQUE: Multidetector CT imaging of the abdomen and pelvis was performed following the standard protocol without IV contrast.   RADIATION DOSE REDUCTION: This exam was performed according to the departmental dose-optimization program which includes automated exposure control, adjustment of the mA and/or kV according to patient size and/or use of iterative reconstruction technique.   COMPARISON:  09/04/2019   FINDINGS: Lower chest: No acute findings.   Hepatobiliary: No mass visualized on  this unenhanced exam. Gallbladder is unremarkable. No evidence of biliary ductal dilatation.   Pancreas: No mass or inflammatory process visualized on this unenhanced exam.   Spleen:  Within normal limits in size.   Adrenals/Urinary tract: Bilateral renal calculi are seen, largest measuring 6 mm on the right. No evidence of ureteral calculi or hydronephrosis. Unremarkable unopacified urinary bladder.   Stomach/Bowel: Mild-to-moderate diverticulitis is seen involving the proximal sigmoid colon. A few tiny pericolonic air bubbles are noted, consistent with micro perforation. No evidence of free intraperitoneal air or abscess.   Vascular/Lymphatic: No pathologically enlarged lymph nodes identified. No evidence of abdominal aortic aneurysm.   Reproductive:  No mass or other significant abnormality.   Other:  None.   Musculoskeletal:  No suspicious bone lesions identified.   IMPRESSION: Mild-to-moderate proximal sigmoid diverticulitis, with micro-perforation. No evidence of abscess or free intraperitoneal air.   Bilateral nephrolithiasis. No evidence of ureteral calculi or hydronephrosis.     Electronically Signed   By: Norleen DELENA Kil M.D.   On: 02/18/2023 12:09      Assessment & Plan:  Diverticulitis Recent episode and 1st every episode of uncomplicated diverticulitis treated with antibiotics. No current symptoms. -Plan for colonoscopy at this time given recent diverticulitis and history of colon polyps.  Eosinophilic Esophagitis (EOE) Chronic condition, currently experiencing symptoms of food sticking. On Pantoprazole  40 mg twice daily for years and recently restarted Budesonide . -  Continue Budesonide  slurry 2 mg once daily for 4-8 weeks. -Consider elimination of dairy from diet to potentially reduce flares. -Plan for upper endoscopy to assess for strictures and inflammation. -Continue pantoprazole  40 mg twice daily -Consider future treatment with Dupixent if multiple  rounds of steroids are needed.  Gastroesophageal Reflux Disease (GERD) Long-standing condition managed with Pantoprazole . -Ensure refills on Pantoprazole  are available.  History of adenomatous colon polyps Colonoscopy technically due in 2 years but proceeding sooner based on recent diverticulitis attack  40 minutes total spent today including patient facing time, coordination of care, reviewing medical history/procedures/pertinent radiology studies, and documentation of the encounter.

## 2023-04-02 NOTE — Patient Instructions (Addendum)
 Continue pantoprazole  twice daily and budesonide  slurry daily.   Try to eliminate dairy products.   You have been scheduled for an endoscopy and colonoscopy. Please follow the written instructions given to you at your visit today.  Please pick up your prep supplies at the pharmacy within the next 1-3 days.  If you use inhalers (even only as needed), please bring them with you on the day of your procedure.  DO NOT TAKE 7 DAYS PRIOR TO TEST- Trulicity (dulaglutide) Ozempic, Wegovy (semaglutide) Mounjaro (tirzepatide) Bydureon Bcise (exanatide extended release)  DO NOT TAKE 1 DAY PRIOR TO YOUR TEST Rybelsus (semaglutide) Adlyxin (lixisenatide) Victoza (liraglutide) Byetta (exanatide) ___________________________________________________________________________  [name] will receive your bowel preparation through [facility], which ensures the lowest copay and home delivery, with outreach via text or call from an [phone] number. Please respond promptly to avoid rescheduling. If you are interested in alternative options or have any questions please contact them at [phone]  Your Provider Has Sent Your Bowel Prep Regimen To [facility] What to expect. [facility] will contact you to verify your information and collect your copay, if applicable. Enjoy the comfort of your home while we deliver your prescription to you, free of any shipping charges. Fast, FREE delivery or shipping. [facility] accepts all major insurance benefits and applies discounts & coupons  Have additional questions? [facility]'s patient care team is always here to help.  Chat: [email or web address] Call: [phone] Email: [email or web address] NCPDP: [id number] How will we contact you? Welcome Phone call  a Welcome text and a Checkout link in a text Texts you receive from [phone] Are Not Spam.   *To set up delivery, you must complete the checkout process via link or speak to one of our patient care  representatives. If we are unable to reach you, your prescription may be delayed.  _______________________________________________________  If your blood pressure at your visit was 140/90 or greater, please contact your primary care physician to follow up on this.  _______________________________________________________  If you are age 98 or older, your body mass index should be between 23-30. Your Body mass index is 20.12 kg/m. If this is out of the aforementioned range listed, please consider follow up with your Primary Care Provider.  If you are age 35 or younger, your body mass index should be between 19-25. Your Body mass index is 20.12 kg/m. If this is out of the aformentioned range listed, please consider follow up with your Primary Care Provider.   ________________________________________________________  The [facility] providers would like to encourage you to use MYCHART to communicate with providers for non-urgent requests or questions.  Due to long hold times on the telephone, sending your provider a message by [facility] may be a faster and more efficient way to get a response.  Please allow 48 business hours for a response.  Please remember that this is for non-urgent requests.  _______________________________________________________

## 2023-04-21 ENCOUNTER — Ambulatory Visit: Payer: BC Managed Care – PPO | Admitting: Internal Medicine

## 2023-05-06 DIAGNOSIS — L818 Other specified disorders of pigmentation: Secondary | ICD-10-CM | POA: Diagnosis not present

## 2023-05-06 DIAGNOSIS — D225 Melanocytic nevi of trunk: Secondary | ICD-10-CM | POA: Diagnosis not present

## 2023-05-06 DIAGNOSIS — L821 Other seborrheic keratosis: Secondary | ICD-10-CM | POA: Diagnosis not present

## 2023-05-14 ENCOUNTER — Other Ambulatory Visit: Payer: Self-pay

## 2023-05-14 MED ORDER — AMBULATORY NON FORMULARY MEDICATION: 0 refills | Status: AC

## 2023-05-14 NOTE — Telephone Encounter (Signed)
According to the last office note in January 2025 with Dr Brunetta Jeans is to take the Budesonide slurry for 4- 8 weeks. She has an upcoming appointment with Dr Rhea Belton early March. Refill faxed to [facility].

## 2023-05-26 ENCOUNTER — Encounter: Payer: Self-pay | Admitting: Certified Registered Nurse Anesthetist

## 2023-05-28 ENCOUNTER — Encounter: Payer: Self-pay | Admitting: Internal Medicine

## 2023-05-28 ENCOUNTER — Ambulatory Visit: Payer: BC Managed Care – PPO | Admitting: Internal Medicine

## 2023-05-28 VITALS — BP 127/64 | HR 78 | Temp 98.0°F | Resp 13 | Ht 62.0 in | Wt 110.0 lb

## 2023-05-28 DIAGNOSIS — K2289 Other specified disease of esophagus: Secondary | ICD-10-CM | POA: Diagnosis not present

## 2023-05-28 DIAGNOSIS — Z1211 Encounter for screening for malignant neoplasm of colon: Secondary | ICD-10-CM | POA: Diagnosis not present

## 2023-05-28 DIAGNOSIS — K317 Polyp of stomach and duodenum: Secondary | ICD-10-CM | POA: Diagnosis not present

## 2023-05-28 DIAGNOSIS — K5730 Diverticulosis of large intestine without perforation or abscess without bleeding: Secondary | ICD-10-CM | POA: Diagnosis not present

## 2023-05-28 DIAGNOSIS — Z860100 Personal history of colon polyps, unspecified: Secondary | ICD-10-CM

## 2023-05-28 DIAGNOSIS — D123 Benign neoplasm of transverse colon: Secondary | ICD-10-CM

## 2023-05-28 DIAGNOSIS — Z860101 Personal history of adenomatous and serrated colon polyps: Secondary | ICD-10-CM

## 2023-05-28 DIAGNOSIS — R1310 Dysphagia, unspecified: Secondary | ICD-10-CM | POA: Diagnosis not present

## 2023-05-28 DIAGNOSIS — K449 Diaphragmatic hernia without obstruction or gangrene: Secondary | ICD-10-CM

## 2023-05-28 DIAGNOSIS — K222 Esophageal obstruction: Secondary | ICD-10-CM

## 2023-05-28 DIAGNOSIS — K635 Polyp of colon: Secondary | ICD-10-CM

## 2023-05-28 DIAGNOSIS — K200 Eosinophilic esophagitis: Secondary | ICD-10-CM

## 2023-05-28 DIAGNOSIS — K2100 Gastro-esophageal reflux disease with esophagitis, without bleeding: Secondary | ICD-10-CM

## 2023-05-28 MED ORDER — SODIUM CHLORIDE 0.9 % IV SOLN: 500.0000 mL | INTRAVENOUS | Status: DC

## 2023-05-28 NOTE — Op Note (Signed)
 [facility] Patient Name: [name] Procedure Date: 05/28/2023 8:33 AM MRN: [id number] Endoscopist: Beverley Fiedler , MD, [id number] Age: 63 Referring MD:  Date of Birth: 1960/07/24 Gender: Female Account #: [id number] Procedure:                Upper GI endoscopy Indications:              Dysphagia, Eosinophilic esophagitis diagnosed in                            2015 (initial esophageal bx with eosinophils >                            100/hpf), 2018 after treatment EoE negative, recent                            pantoprazole 20 mg daily and liquid budesonide                            (last dose 2 days ago) Medicines:                Monitored Anesthesia Care Procedure:                Pre-Anesthesia Assessment:                           - Prior to the procedure, a History and Physical                            was performed, and patient medications and                            allergies were reviewed. The patient's tolerance of                            previous anesthesia was also reviewed. The risks                            and benefits of the procedure and the sedation                            options and risks were discussed with the patient.                            All questions were answered, and informed consent                            was obtained. Prior Anticoagulants: The patient has                            taken no anticoagulant or antiplatelet agents. ASA                            Grade Assessment: II - A patient with mild systemic  disease. After reviewing the risks and benefits,                            the patient was deemed in satisfactory condition to                            undergo the procedure.                           After obtaining informed consent, the endoscope was                            passed under direct vision. Throughout the                            procedure, the patient's blood pressure,  pulse, and                            oxygen saturations were monitored continuously. The                            Olympus scope [id number] was introduced through the                            mouth, and advanced to the second part of duodenum.                            The upper GI endoscopy was accomplished without                            difficulty. The patient tolerated the procedure                            well. Scope In: Scope Out: Findings:                 Mucosal changes were found in the entire esophagus.                            Esophageal findings were graded using the                            Eosinophilic Esophagitis Endoscopic Reference Score                            (EoE-EREFS) as: Edema Grade 0 Normal (distinct                            vascular markings), Rings Grade 1 Mild (subtle                            circumferential ridges seen on esophageal                            distension), Exudates Grade 0 None (no white  lesions seen), Furrows Grade 0 None (no vertical                            lines seen) and Stricture present (12 mm luminal                            diameter). Biopsies were obtained from the proximal                            and distal esophagus with cold forceps for                            histology of suspected eosinophilic esophagitis. A                            TTS dilator was passed through the scope. Dilation                            with a 15-16.5-18 mm balloon dilator was performed                            to 16.5 mm. The dilation site was examined and                            showed moderate mucosal disruption.                           A 2 cm hiatal hernia was present.                           A few small sessile polyps were found in the                            gastric fundus and in the gastric body.                           The exam of the stomach was otherwise normal.                            The examined duodenum was normal. Complications:            No immediate complications. Estimated Blood Loss:     Estimated blood loss was minimal. Impression:               - Esophageal mucosal changes secondary to                            eosinophilic esophagitis. Minimal changes today (in                            presence of budesonide). Dilated GE Junction                            stricture to 16.5 mm.                           -  2 cm hiatal hernia.                           - A few gastric polyps. Benign with typical                            appearance of fundic gland polyps (5 mm or smaller).                           - Normal examined duodenum.                           - Biopsies were taken with a cold forceps for                            evaluation of eosinophilic esophagitis. Recommendation:           - Patient has a contact number available for                            emergencies. The signs and symptoms of potential                            delayed complications were discussed with the                            patient. Return to normal activities tomorrow.                            Written discharge instructions were provided to the                            patient.                           - Post-dilation diet and then advance diet as                            tolerated.                           - Continue present medications. If need for                            frequent budesonide Dupixent should be considered.                           - Await pathology results. Beverley Fiedler, MD 05/28/2023 9:08:57 AM This report has been signed electronically.

## 2023-05-28 NOTE — Progress Notes (Signed)
 Called to room to assist during endoscopic procedure.  Patient ID and intended procedure confirmed with present staff. Received instructions for my participation in the procedure from the performing physician.

## 2023-05-28 NOTE — Op Note (Signed)
 [facility] Patient Name: [name] Procedure Date: 05/28/2023 8:32 AM MRN: [id number] Endoscopist: Beverley Fiedler , MD, [id number] Age: 63 Referring MD:  Date of Birth: 01-Jun-1960 Gender: Female Account #: [id number] Procedure:                Colonoscopy Indications:              High risk colon cancer surveillance: Personal                            history of non-advanced adenoma, Last colonoscopy:                            June 2020; diverticulitis Dec 2024 now resolved Medicines:                Monitored Anesthesia Care Procedure:                Pre-Anesthesia Assessment:                           - Prior to the procedure, a History and Physical                            was performed, and patient medications and                            allergies were reviewed. The patient's tolerance of                            previous anesthesia was also reviewed. The risks                            and benefits of the procedure and the sedation                            options and risks were discussed with the patient.                            All questions were answered, and informed consent                            was obtained. Prior Anticoagulants: The patient has                            taken no anticoagulant or antiplatelet agents. ASA                            Grade Assessment: II - A patient with mild systemic                            disease. After reviewing the risks and benefits,                            the patient was deemed in satisfactory condition to  undergo the procedure.                           After obtaining informed consent, the colonoscope                            was passed under direct vision. Throughout the                            procedure, the patient's blood pressure, pulse, and                            oxygen saturations were monitored continuously. The                            Olympus Scope  SN [id number] was introduced through the                            anus and advanced to the cecum, identified by                            appendiceal orifice and ileocecal valve. The                            colonoscopy was performed without difficulty. The                            patient tolerated the procedure well. The quality                            of the bowel preparation was good. The ileocecal                            valve, appendiceal orifice, and rectum were                            photographed. Scope In: 8:50:17 AM Scope Out: 9:02:18 AM Scope Withdrawal Time: 0 hours 10 minutes 8 seconds  Total Procedure Duration: 0 hours 12 minutes 1 second  Findings:                 The digital rectal exam was normal.                           A 4 mm polyp was found in the transverse colon. The                            polyp was sessile. The polyp was removed with a                            cold snare. Resection and retrieval were complete.                           Multiple medium-mouthed and small-mouthed  diverticula were found in the sigmoid colon and                            descending colon.                           The retroflexed view of the distal rectum and anal                            verge was normal and showed no anal or rectal                            abnormalities. Complications:            No immediate complications. Estimated Blood Loss:     Estimated blood loss was minimal. Impression:               - One 4 mm polyp in the transverse colon, removed                            with a cold snare. Resected and retrieved.                           - Mild diverticulosis in the sigmoid colon and in                            the descending colon.                           - The distal rectum and anal verge are normal on                            retroflexion view. Recommendation:           - Patient has a contact number  available for                            emergencies. The signs and symptoms of potential                            delayed complications were discussed with the                            patient. Return to normal activities tomorrow.                            Written discharge instructions were provided to the                            patient.                           - Resume previous diet.                           - Continue present medications.                           -  Await pathology results.                           - Repeat colonoscopy is recommended for                            surveillance. The colonoscopy date will be                            determined after pathology results from today's                            exam become available for review. Beverley Fiedler, MD 05/28/2023 9:11:11 AM This report has been signed electronically.

## 2023-05-28 NOTE — Progress Notes (Signed)
 0901 Ephedrine 5 mg given IV due to low BP, MD updated.

## 2023-05-28 NOTE — Progress Notes (Signed)
 Pt's states no medical or surgical changes since previsit or office visit.

## 2023-05-28 NOTE — Progress Notes (Signed)
0800 Robinul 0.1 mg IV given due large amount of secretions upon assessment.  MD made aware, vss 

## 2023-05-28 NOTE — Progress Notes (Signed)
 GASTROENTEROLOGY PROCEDURE H&P NOTE   Primary Care Physician: Jac Canavan, PA-C    Reason for Procedure:  EoE, dysphagia and history of polyps with recent diverticulitis  Plan:    EGD and colonoscopy  Patient is appropriate for endoscopic procedure(s) in the ambulatory ([facility]) setting.  The nature of the procedure, as well as the risks, benefits, and alternatives were carefully and thoroughly reviewed with the patient. Ample time for discussion and questions allowed. The patient understood, was satisfied, and agreed to proceed.     HPI: [name] is a 63 y.o. female who presents for EGD and colonoscopy.  Medical history as below.  Tolerated the prep.  No recent chest pain or shortness of breath.  No abdominal pain today.  Past Medical History:  Diagnosis Date   Allergy    Anxiety    Asthma    Chronic back pain    Depression    Eosinophilic esophagitis    Esophageal spasm    Esophageal stricture    Gastritis    GERD (gastroesophageal reflux disease)    Headache    occasional   IBS (irritable bowel syndrome)    Kidney stone 2015   gets every few years, hx/o calcium oxalate stones   Osteoporosis    Scoliosis    Seasonal allergic conjunctivitis    Seasonal allergic rhinitis    Small intestinal bacterial overgrowth    Vitamin D deficiency    Wears glasses     Past Surgical History:  Procedure Laterality Date   APPENDECTOMY     BREAST EXCISIONAL BIOPSY     left   COLONOSCOPY  2013   [place], [place]   COLONOSCOPY  08/2018   TA, diverticula   ESOPHAGEAL DILATION     several times prior, [place], [place]   ESOPHAGOPLASTY  07/2014   complication of esophageal dilitation, hospitalization at [facility]   GANGLION CYST EXCISION Right 12/31/2017   Procedure: EXCISION RIGHT WRIST DORSAL GANGLION;  Surgeon: Betha Loa, MD;  Location: [facility];  Service: Orthopedics;  Laterality: Right;  Bier block   INCISION AND DRAINAGE ABSCESS Bilateral  09/14/2015   Procedure: INCISION AND DRAINAGE ABSCESS;  Surgeon: Betha Loa, MD;  Location: [facility];  Service: Orthopedics;  Laterality: Bilateral;   KIDNEY STONE SURGERY  1986   LITHOTRIPSY     4x prior as of 05/2014    Prior to Admission medications   Medication Sig Start Date End Date Taking? Authorizing Provider  AMBULATORY NON FORMULARY MEDICATION Budesonide slurry 2mg /24ml  Sig: 5cc daily 05/14/23  Yes Mikhaela Zaugg, Carie Caddy, MD  betamethasone dipropionate 0.05 % cream Apply topically 2 (two) times daily as needed. 04/16/21  Yes [provider]  cholecalciferol (VITAMIN D3) 25 MCG (1000 UT) tablet Take 2 tablets (2,000 Units total) by mouth daily. 01/25/19  Yes Tysinger, Kermit Balo, PA-C  estradiol (VIVELLE-DOT) 0.025 MG/24HR Place 1 patch onto the skin 2 (two) times a week.  12/05/18  Yes [provider]  fluticasone (FLONASE) 50 MCG/ACT nasal spray Place 2 sprays into both nostrils daily. 09/08/18  Yes Marcelyn Bruins, MD  pantoprazole (PROTONIX) 20 MG tablet TAKE ONE TABLET BY MOUTH ONE TIME DAILY 10/13/22  Yes Unk Lightning, PA  progesterone (PROMETRIUM) 200 MG capsule Take 200 mg by mouth daily.  08/23/19  Yes [provider]  albuterol (VENTOLIN HFA) 108 (90 Base) MCG/ACT inhaler Inhale 2 puffs into the lungs every 6 (six) hours as needed for wheezing  or shortness of breath. 01/30/23   Tysinger, Kermit Balo, PA-C  diclofenac (VOLTAREN) 75 MG EC tablet Take 1 tablet (75 mg total) by mouth 2 (two) times daily. 01/24/19   Tysinger, Kermit Balo, PA-C  dicyclomine (BENTYL) 20 MG tablet Take 1 tablet (20 mg total) by mouth 3 (three) times daily as needed for spasms. 02/16/23   Doree Albee, PA-C  fluticasone (FLOVENT HFA) 220 MCG/ACT inhaler Inhale 2 puffs into the lungs as needed. Inhale two puffs one to two times daily to prevent cough or wheeze. Rinse, gargle, and spit after use. Patient not taking: Reported on 05/28/2023 12/18/21   Jac Canavan,  PA-C  fluticasone-salmeterol (ADVAIR HFA) [phone] MCG/ACT inhaler Inhale 2 puffs into the lungs 2 (two) times daily. Patient not taking: Reported on 05/28/2023 01/30/23   Tysinger, Kermit Balo, PA-C  LORazepam (ATIVAN) 0.5 MG tablet Take 1 tablet (0.5 mg total) by mouth 2 (two) times daily as needed for anxiety. 01/30/23   Tysinger, Kermit Balo, PA-C  methocarbamol (ROBAXIN) 500 MG tablet Take 1 tablet (500 mg total) by mouth 2 (two) times daily as needed (back pain). 01/27/22   Tysinger, Kermit Balo, PA-C    Current Outpatient Medications  Medication Sig Dispense Refill   AMBULATORY NON FORMULARY MEDICATION Budesonide slurry 2mg /60ml  Sig: 5cc daily 300 mL 0   betamethasone dipropionate 0.05 % cream Apply topically 2 (two) times daily as needed.     cholecalciferol (VITAMIN D3) 25 MCG (1000 UT) tablet Take 2 tablets (2,000 Units total) by mouth daily. 180 tablet 3   estradiol (VIVELLE-DOT) 0.025 MG/24HR Place 1 patch onto the skin 2 (two) times a week.      fluticasone (FLONASE) 50 MCG/ACT nasal spray Place 2 sprays into both nostrils daily. 16 g 5   pantoprazole (PROTONIX) 20 MG tablet TAKE ONE TABLET BY MOUTH ONE TIME DAILY 90 tablet 1   progesterone (PROMETRIUM) 200 MG capsule Take 200 mg by mouth daily.      albuterol (VENTOLIN HFA) 108 (90 Base) MCG/ACT inhaler Inhale 2 puffs into the lungs every 6 (six) hours as needed for wheezing or shortness of breath. 18 g 2   diclofenac (VOLTAREN) 75 MG EC tablet Take 1 tablet (75 mg total) by mouth 2 (two) times daily. 45 tablet 2   dicyclomine (BENTYL) 20 MG tablet Take 1 tablet (20 mg total) by mouth 3 (three) times daily as needed for spasms. 50 tablet 0   fluticasone (FLOVENT HFA) 220 MCG/ACT inhaler Inhale 2 puffs into the lungs as needed. Inhale two puffs one to two times daily to prevent cough or wheeze. Rinse, gargle, and spit after use. (Patient not taking: Reported on 05/28/2023) 1 each 1   fluticasone-salmeterol (ADVAIR HFA) 115-21 MCG/ACT inhaler Inhale 2  puffs into the lungs 2 (two) times daily. (Patient not taking: Reported on 05/28/2023) 1 each 12   LORazepam (ATIVAN) 0.5 MG tablet Take 1 tablet (0.5 mg total) by mouth 2 (two) times daily as needed for anxiety. 60 tablet 1   methocarbamol (ROBAXIN) 500 MG tablet Take 1 tablet (500 mg total) by mouth 2 (two) times daily as needed (back pain). 45 tablet 2   Current Facility-Administered Medications  Medication Dose Route Frequency Provider Last Rate Last Admin   0.9 %  sodium chloride infusion  500 mL Intravenous Continuous Zada Haser, Carie Caddy, MD        Allergies as of 05/28/2023   (No Known Allergies)    Family History  Problem Relation  Age of Onset   Barrett's esophagus Mother    Diabetes Father    Kidney Stones Father    Hypertension Father    Heart attack Father    Heart disease Father 3       MI , stent   Diabetes Sister    Kidney Stones Brother    Migraines Brother    Urolithiasis Brother    Diabetes Brother    Headache Brother    Heart disease Maternal Grandfather        CABG   Barrett's esophagus Paternal Grandfather    Heart disease Maternal Grandmother 36       rheumatic related heart disesae   Cancer Neg Hx    Stroke Neg Hx    Colon cancer Neg Hx    Esophageal cancer Neg Hx    Stomach cancer Neg Hx    Rectal cancer Neg Hx     Social History   Socioeconomic History   Marital status: Single    Spouse name: Not on file   Number of children: 0   Years of education: Not on file   Highest education level: Not on file  Occupational History   Occupation: Writer    Comment: [employer]  Tobacco Use   Smoking status: Never   Smokeless tobacco: Never  Vaping Use   Vaping status: Never Used  Substance and Sexual Activity   Alcohol use: Yes    Alcohol/week: 5.0 standard drinks of alcohol    Types: 5 Glasses of wine per week    Comment: social   Drug use: Never   Sexual activity: Not Currently  Other Topics Concern   Not on file  Social History Narrative    2 dogs, muts [place] and [place], exercise 3-4 days per week with walking, Yoga, weights, CPA.  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  01/2023.   Social Drivers of Corporate investment banker Strain: Low Risk  (01/30/2023)   Overall Financial Resource Strain (CARDIA)    Difficulty of Paying Living Expenses: Not very hard  Food Insecurity: No Food Insecurity (01/30/2023)   Hunger Vital Sign    Worried About Running Out of Food in the Last Year: Never true    Ran Out of Food in the Last Year: Never true  Transportation Needs: No Transportation Needs (01/30/2023)   PRAPARE - Administrator, Civil Service (Medical): No    Lack of Transportation (Non-Medical): No  Physical Activity: Sufficiently Active (01/30/2023)   Exercise Vital Sign    Days of Exercise per Week: 7 days    Minutes of Exercise per Session: 60 min  Stress: Stress Concern Present (01/30/2023)   [employer] of Occupational Health - Occupational Stress Questionnaire    Feeling of Stress : To some extent  Social Connections: Socially Isolated (01/30/2023)   Social Connection and Isolation Panel [NHANES]    Frequency of Communication with Friends and Family: More than three times a week    Frequency of Social Gatherings with Friends and Family: Once a week    Attends Religious Services: Never    Database administrator or Organizations: No    Attends Banker Meetings: Never    Marital Status: Widowed  Intimate Partner Violence: Not At Risk (01/30/2023)   Humiliation, Afraid, Rape, and Kick questionnaire    Fear of Current or Ex-Partner: No    Emotionally Abused: No    Physically Abused: No    Sexually Abused: No  Physical Exam: Vital signs in last 24 hours: @BP  107/70   Pulse 70   Temp 98 F (36.7 C) (Temporal)   Ht 5\' 2"  (1.575 m)   Wt 110 lb (49.9 kg)   LMP 10/31/2016   SpO2 99%   BMI 20.12 kg/m  GEN: NAD EYE: Sclerae anicteric ENT: MMM CV: Non-tachycardic Pulm: CTA  b/l GI: Soft, NT/ND NEURO:  Alert & Oriented x 3   Rebecca Blinks, MD [facility]  05/28/2023 8:29 AM

## 2023-05-28 NOTE — Progress Notes (Signed)
 Report given to PACU, vss

## 2023-05-28 NOTE — Patient Instructions (Signed)
 Educational handout provided to patient related to Polyps, and Diverticulosis, StrictrureS  POST DILATION DIET  Continue present medications  Awaiting pathology results   YOU HAD AN ENDOSCOPIC PROCEDURE TODAY AT THE [facility]:   Refer to the procedure report that was given to you for any specific questions about what was found during the examination.  If the procedure report does not answer your questions, please call your gastroenterologist to clarify.  If you requested that your care partner not be given the details of your procedure findings, then the procedure report has been included in a sealed envelope for you to review at your convenience later.  YOU SHOULD EXPECT: Some feelings of bloating in the abdomen. Passage of more gas than usual.  Walking can help get rid of the air that was put into your GI tract during the procedure and reduce the bloating. If you had a lower endoscopy (such as a colonoscopy or flexible sigmoidoscopy) you may notice spotting of blood in your stool or on the toilet paper. If you underwent a bowel prep for your procedure, you may not have a normal bowel movement for a few days.  Please Note:  You might notice some irritation and congestion in your nose or some drainage.  This is from the oxygen used during your procedure.  There is no need for concern and it should clear up in a day or so.  SYMPTOMS TO REPORT IMMEDIATELY:  Following lower endoscopy (colonoscopy or flexible sigmoidoscopy):  Excessive amounts of blood in the stool  Significant tenderness or worsening of abdominal pains  Swelling of the abdomen that is new, acute  Fever of 100F or higher  Following upper endoscopy (EGD)  Vomiting of blood or coffee ground material  New chest pain or pain under the shoulder blades  Painful or persistently difficult swallowing  New shortness of breath  Fever of 100F or higher  Black, tarry-looking stools  For urgent or emergent issues, a  gastroenterologist can be reached at any hour by calling [phone]. Do not use MyChart messaging for urgent concerns.    DIET:  We do recommend a small meal at first, but then you may proceed to your regular diet.  Drink plenty of fluids but you should avoid alcoholic beverages for 24 hours.  ACTIVITY:  You should plan to take it easy for the rest of today and you should NOT DRIVE or use heavy machinery until tomorrow (because of the sedation medicines used during the test).    FOLLOW UP: Our staff will call the number listed on your records the next business day following your procedure.  We will call around 7:15- 8:00 am to check on you and address any questions or concerns that you may have regarding the information given to you following your procedure. If we do not reach you, we will leave a message.     If any biopsies were taken you will be contacted by phone or by letter within the next 1-3 weeks.  Please call us at [phone] if you have not heard about the biopsies in 3 weeks.    SIGNATURES/CONFIDENTIALITY: You and/or your care partner have signed paperwork which will be entered into your electronic medical record.  These signatures attest to the fact that that the information above on your After Visit Summary has been reviewed and is understood.  Full responsibility of the confidentiality of this discharge information lies with you and/or your care-partner.

## 2023-05-29 ENCOUNTER — Telehealth: Payer: Self-pay

## 2023-05-29 NOTE — Telephone Encounter (Signed)
  Follow up Call-     05/28/2023    7:29 AM  Call back number  Post procedure Call Back phone  # [phone]  Permission to leave phone message Yes     Patient questions:  Do you have a fever, pain , or abdominal swelling? No. Pain Score  2  Have you tolerated food without any problems? Yes.    Have you been able to return to your normal activities? Yes.    Do you have any questions about your discharge instructions: Diet   No. Medications  No. Follow up visit  No.  Do you have questions or concerns about your Care? Yes    Esophagus  'discomfort" , started this morning, denies pain, able to eat and drink ok,  Instruct to take liquid tylenol, drink cold liquids, call if worsens, pt states she is going to resume taking budesonide while waiting for biopsies to come back Actions: * If pain score is 4 or above: No action needed, pain <4.

## 2023-06-03 LAB — SURGICAL PATHOLOGY

## 2023-06-08 ENCOUNTER — Encounter: Payer: Self-pay | Admitting: Internal Medicine

## 2023-06-12 DIAGNOSIS — L290 Pruritus ani: Secondary | ICD-10-CM | POA: Diagnosis not present

## 2023-06-12 DIAGNOSIS — N760 Acute vaginitis: Secondary | ICD-10-CM | POA: Diagnosis not present

## 2023-06-29 DIAGNOSIS — N761 Subacute and chronic vaginitis: Secondary | ICD-10-CM | POA: Diagnosis not present

## 2023-06-29 DIAGNOSIS — N760 Acute vaginitis: Secondary | ICD-10-CM | POA: Diagnosis not present

## 2023-06-29 DIAGNOSIS — N771 Vaginitis, vulvitis and vulvovaginitis in diseases classified elsewhere: Secondary | ICD-10-CM | POA: Diagnosis not present

## 2023-07-13 ENCOUNTER — Other Ambulatory Visit: Payer: Self-pay | Admitting: Physician Assistant

## 2023-08-10 DIAGNOSIS — L820 Inflamed seborrheic keratosis: Secondary | ICD-10-CM | POA: Diagnosis not present

## 2023-10-15 DIAGNOSIS — F411 Generalized anxiety disorder: Secondary | ICD-10-CM | POA: Diagnosis not present

## 2023-10-19 DIAGNOSIS — L281 Prurigo nodularis: Secondary | ICD-10-CM | POA: Diagnosis not present

## 2023-10-19 DIAGNOSIS — L820 Inflamed seborrheic keratosis: Secondary | ICD-10-CM | POA: Diagnosis not present

## 2023-10-23 DIAGNOSIS — M9901 Segmental and somatic dysfunction of cervical region: Secondary | ICD-10-CM | POA: Diagnosis not present

## 2023-10-23 DIAGNOSIS — M9902 Segmental and somatic dysfunction of thoracic region: Secondary | ICD-10-CM | POA: Diagnosis not present

## 2023-10-30 DIAGNOSIS — M9901 Segmental and somatic dysfunction of cervical region: Secondary | ICD-10-CM | POA: Diagnosis not present

## 2023-10-30 DIAGNOSIS — M9902 Segmental and somatic dysfunction of thoracic region: Secondary | ICD-10-CM | POA: Diagnosis not present

## 2023-10-30 DIAGNOSIS — M542 Cervicalgia: Secondary | ICD-10-CM | POA: Diagnosis not present

## 2023-11-05 DIAGNOSIS — F411 Generalized anxiety disorder: Secondary | ICD-10-CM | POA: Diagnosis not present

## 2023-11-06 DIAGNOSIS — M9901 Segmental and somatic dysfunction of cervical region: Secondary | ICD-10-CM | POA: Diagnosis not present

## 2023-11-06 DIAGNOSIS — M9902 Segmental and somatic dysfunction of thoracic region: Secondary | ICD-10-CM | POA: Diagnosis not present

## 2023-11-13 DIAGNOSIS — M9901 Segmental and somatic dysfunction of cervical region: Secondary | ICD-10-CM | POA: Diagnosis not present

## 2023-11-13 DIAGNOSIS — M9902 Segmental and somatic dysfunction of thoracic region: Secondary | ICD-10-CM | POA: Diagnosis not present

## 2023-12-11 DIAGNOSIS — M9901 Segmental and somatic dysfunction of cervical region: Secondary | ICD-10-CM | POA: Diagnosis not present

## 2023-12-11 DIAGNOSIS — M9902 Segmental and somatic dysfunction of thoracic region: Secondary | ICD-10-CM | POA: Diagnosis not present

## 2023-12-17 DIAGNOSIS — N760 Acute vaginitis: Secondary | ICD-10-CM | POA: Diagnosis not present

## 2023-12-17 DIAGNOSIS — Z01419 Encounter for gynecological examination (general) (routine) without abnormal findings: Secondary | ICD-10-CM | POA: Diagnosis not present

## 2023-12-17 DIAGNOSIS — Z681 Body mass index (BMI) 19 or less, adult: Secondary | ICD-10-CM | POA: Diagnosis not present

## 2023-12-17 DIAGNOSIS — Z1231 Encounter for screening mammogram for malignant neoplasm of breast: Secondary | ICD-10-CM | POA: Diagnosis not present

## 2023-12-25 DIAGNOSIS — M9901 Segmental and somatic dysfunction of cervical region: Secondary | ICD-10-CM | POA: Diagnosis not present

## 2023-12-25 DIAGNOSIS — M9902 Segmental and somatic dysfunction of thoracic region: Secondary | ICD-10-CM | POA: Diagnosis not present

## 2023-12-28 DIAGNOSIS — F411 Generalized anxiety disorder: Secondary | ICD-10-CM | POA: Diagnosis not present

## 2024-01-10 ENCOUNTER — Emergency Department (HOSPITAL_COMMUNITY)
Admission: EM | Admit: 2024-01-10 | Discharge: 2024-01-11 | Disposition: A | Discharge status: Home / Self Care | Attending: Emergency Medicine | Admitting: Emergency Medicine

## 2024-01-10 ENCOUNTER — Encounter (HOSPITAL_COMMUNITY): Payer: Self-pay

## 2024-01-10 DIAGNOSIS — R10A1 Flank pain, right side: Secondary | ICD-10-CM | POA: Insufficient documentation

## 2024-01-10 DIAGNOSIS — R109 Unspecified abdominal pain: Secondary | ICD-10-CM | POA: Diagnosis not present

## 2024-01-10 LAB — URINALYSIS, ROUTINE W REFLEX MICROSCOPIC
Bacteria, UA: NONE SEEN
Bilirubin Urine: NEGATIVE
Glucose, UA: NEGATIVE mg/dL
Ketones, ur: NEGATIVE mg/dL
Leukocytes,Ua: NEGATIVE
Nitrite: NEGATIVE
Protein, ur: 100 mg/dL — AB
RBC / HPF: >50 RBC/hpf (ref 0–5)
Specific Gravity, Urine: 1.03 (ref 1.005–1.030)
pH: 5 (ref 5.0–8.0)

## 2024-01-10 MED ORDER — OXYCODONE-ACETAMINOPHEN 5-325 MG PO TABS
1.0000 | ORAL_TABLET | ORAL | Status: DC | PRN
  Administered 2024-01-10: 1 via ORAL
  Filled 2024-01-10: qty 1

## 2024-01-10 NOTE — ED Provider Notes (Signed)
 [place] EMERGENCY DEPARTMENT AT [facility] Provider Note   CSN: [id number] Arrival date & time: 01/10/24  2124     Patient presents with: Flank Pain   [name] is a 63 y.o. female.  Patient with past medical history significant for IBS, GERD, chronic back pain, reported kidney stones presents to the emergency room complaining of right sided flank pain.  She states the pain began this morning, went away, and returned this evening.  She felt like it radiated down towards her bladder.  Her pain has now subsided significantly.  She states it feels like previous kidney stones.  She endorses some nausea prior to arrival which has now subsided.  Pain medication was given during triage and pain is now under control.  She denies chest pain, other abdominal pain, shortness of breath, fever.  {Add pertinent medical, surgical, social history, OB history to HPI:32947}  Flank Pain       Prior to Admission medications   Medication Sig Start Date End Date Taking? Authorizing Provider  albuterol  (VENTOLIN  HFA) 108 (90 Base) MCG/ACT inhaler Inhale 2 puffs into the lungs every 6 (six) hours as needed for wheezing or shortness of breath. 01/30/23   Tysinger, Alm RAMAN, PA-C  AMBULATORY NON FORMULARY MEDICATION Budesonide  slurry 2mg /68ml  Sig: 5cc daily 05/14/23   Pyrtle, Gordy HERO, MD  betamethasone dipropionate 0.05 % cream Apply topically 2 (two) times daily as needed. 04/16/21   [provider]  cholecalciferol (VITAMIN D3) 25 MCG (1000 UT) tablet Take 2 tablets (2,000 Units total) by mouth daily. 01/25/19   Tysinger, Alm RAMAN, PA-C  diclofenac  (VOLTAREN ) 75 MG EC tablet Take 1 tablet (75 mg total) by mouth 2 (two) times daily. 01/24/19   Tysinger, Alm RAMAN, PA-C  dicyclomine  (BENTYL ) 20 MG tablet Take 1 tablet (20 mg total) by mouth 3 (three) times daily as needed for spasms. 02/16/23   Craig Alan SAUNDERS, PA-C  estradiol (VIVELLE-DOT) 0.025 MG/24HR Place 1 patch onto the skin 2 (two)  times a week.  12/05/18   [provider]  fluticasone  (FLONASE ) 50 MCG/ACT nasal spray Place 2 sprays into both nostrils daily. 09/08/18   Jeneal Danita Macintosh, MD  fluticasone  (FLOVENT  HFA) 220 MCG/ACT inhaler Inhale 2 puffs into the lungs as needed. Inhale two puffs one to two times daily to prevent cough or wheeze. Rinse, gargle, and spit after use. Patient not taking: Reported on 05/28/2023 12/18/21   Bulah Alm RAMAN, PA-C  fluticasone -salmeterol (ADVAIR HFA) 115-21 MCG/ACT inhaler Inhale 2 puffs into the lungs 2 (two) times daily. Patient not taking: Reported on 05/28/2023 01/30/23   Tysinger, Alm RAMAN, PA-C  LORazepam  (ATIVAN ) 0.5 MG tablet Take 1 tablet (0.5 mg total) by mouth 2 (two) times daily as needed for anxiety. 01/30/23   Tysinger, Alm RAMAN, PA-C  methocarbamol  (ROBAXIN ) 500 MG tablet Take 1 tablet (500 mg total) by mouth 2 (two) times daily as needed (back pain). 01/27/22   Tysinger, Alm RAMAN, PA-C  pantoprazole  (PROTONIX ) 20 MG tablet TAKE ONE TABLET BY MOUTH ONE TIME DAILY 07/13/23   Beather Delon Gibson, PA  progesterone (PROMETRIUM) 200 MG capsule Take 200 mg by mouth daily.  08/23/19   [provider]    Allergies: Patient has no known allergies.    Review of Systems  Genitourinary:  Positive for flank pain.    Updated Vital Signs BP (!) 150/76 (BP Location: Right Arm)   Pulse (!) 55   Temp 97.6 F (36.4 C) (Oral)  Resp 18   Ht 5' 2 (1.575 m)   Wt 47.6 kg   LMP 10/31/2016   SpO2 100%   BMI 19.20 kg/m   Physical Exam Vitals and nursing note reviewed.  Constitutional:      General: She is not in acute distress.    Appearance: She is well-developed.  HENT:     Head: Normocephalic and atraumatic.  Eyes:     Conjunctiva/sclera: Conjunctivae normal.  Cardiovascular:     Rate and Rhythm: Normal rate and regular rhythm.     Heart sounds: No murmur heard. Pulmonary:     Effort: Pulmonary effort is normal. No respiratory distress.     Breath  sounds: Normal breath sounds.  Abdominal:     Palpations: Abdomen is soft.     Tenderness: There is no abdominal tenderness. There is no right CVA tenderness or left CVA tenderness.  Musculoskeletal:        General: No swelling.     Cervical back: Neck supple.  Skin:    General: Skin is warm and dry.     Capillary Refill: Capillary refill takes less than 2 seconds.  Neurological:     Mental Status: She is alert.  Psychiatric:        Mood and Affect: Mood normal.     (all labs ordered are listed, but only abnormal results are displayed) Labs Reviewed  URINALYSIS, ROUTINE W REFLEX MICROSCOPIC - Abnormal; Notable for the following components:      Result Value   APPearance HAZY (*)    Hgb urine dipstick LARGE (*)    Protein, ur 100 (*)    All other components within normal limits  BASIC METABOLIC PANEL WITH GFR  CBC WITH DIFFERENTIAL/PLATELET    EKG: None  Radiology: No results found.  {Document cardiac monitor, telemetry assessment procedure when appropriate:32947} Procedures   Medications Ordered in the ED  oxyCODONE -acetaminophen  (PERCOCET/ROXICET) 5-325 MG per tablet 1 tablet (1 tablet Oral Given 01/10/24 2142)      {Click here for ABCD2, HEART and other calculators REFRESH Note before signing:1}                              Medical Decision Making Amount and/or Complexity of Data Reviewed Labs: ordered.   This patient presents to the ED for concern of flank pain, this involves an extensive number of treatment options, and is a complaint that carries with it a high risk of complications and morbidity.  The differential diagnosis includes nephrolithiasis, pyelonephritis, hydronephrosis, cystitis, musculoskeletal pain, others   Co morbidities / Chronic conditions that complicate the patient evaluation  As noted in HPI   Additional history obtained:  Additional history obtained from EMR External records from outside source obtained and reviewed including  outside emergency department notes showing history of diverticulitis   Lab Tests:  I Ordered, and personally interpreted labs.  The pertinent results include: UA with no bacteria, large hemoglobin, 11-20 WBC   Imaging Studies ordered:  I ordered imaging studies including ***  I independently visualized and interpreted imaging which showed *** I agree with the radiologist interpretation   Cardiac Monitoring: / EKG:  The patient was maintained on a cardiac monitor.  I personally viewed and interpreted the cardiac monitored which showed an underlying rhythm of: ***   Problem List / ED Course / Critical interventions / Medication management   I ordered medication including Percocet Reevaluation of the patient after these medicines  showed that the patient *** I have reviewed the patients home medicines and have made adjustments as needed   Consultations Obtained:  I requested consultation with the ***,  and discussed lab and imaging findings as well as pertinent plan - they recommend: ***   Social Determinants of Health:  ***   Test / Admission - Considered:  ***   {Document critical care time when appropriate  Document review of labs and clinical decision tools ie CHADS2VASC2, etc  Document your independent review of radiology images and any outside records  Document your discussion with family members, caretakers and with consultants  Document social determinants of health affecting pt's care  Document your decision making why or why not admission, treatments were needed:32947:::1}   Final diagnoses:  None    ED Discharge Orders     None

## 2024-01-10 NOTE — ED Triage Notes (Signed)
 Patient arrived with right sided flank pain that started today, hx of kidney stones in the past. No urinary symptoms but reports some nausea.

## 2024-01-11 LAB — CBC WITH DIFFERENTIAL/PLATELET
Abs Immature Granulocytes: 0.02 K/uL (ref 0.00–0.07)
Basophils Absolute: 0.1 K/uL (ref 0.0–0.1)
Basophils Relative: 1 %
Eosinophils Absolute: 0.2 K/uL (ref 0.0–0.5)
Eosinophils Relative: 2 %
HCT: 37.9 % (ref 36.0–46.0)
Hemoglobin: 12.3 g/dL (ref 12.0–15.0)
Immature Granulocytes: 0 %
Lymphocytes Relative: 19 %
Lymphs Abs: 1.5 K/uL (ref 0.7–4.0)
MCH: 31 pg (ref 26.0–34.0)
MCHC: 32.5 g/dL (ref 30.0–36.0)
MCV: 95.5 fL (ref 80.0–100.0)
Monocytes Absolute: 0.5 K/uL (ref 0.1–1.0)
Monocytes Relative: 6 %
Neutro Abs: 5.6 K/uL (ref 1.7–7.7)
Neutrophils Relative %: 72 %
Platelets: 315 K/uL (ref 150–400)
RBC: 3.97 MIL/uL (ref 3.87–5.11)
RDW: 11.3 % — ABNORMAL LOW (ref 11.5–15.5)
WBC: 7.9 K/uL (ref 4.0–10.5)
nRBC: 0 % (ref 0.0–0.2)

## 2024-01-11 LAB — BASIC METABOLIC PANEL WITH GFR
Anion gap: 9 (ref 5–15)
BUN: 16 mg/dL (ref 8–23)
CO2: 25 mmol/L (ref 22–32)
Calcium: 9.3 mg/dL (ref 8.9–10.3)
Chloride: 106 mmol/L (ref 98–111)
Creatinine, Ser: 0.86 mg/dL (ref 0.44–1.00)
GFR, Estimated: >60 mL/min (ref >60)
Glucose, Bld: 97 mg/dL (ref 70–99)
Potassium: 3.7 mmol/L (ref 3.5–5.1)
Sodium: 140 mmol/L (ref 135–145)

## 2024-01-11 NOTE — Discharge Instructions (Signed)
 You were seen this evening for right sided flank pain.  This may be related to a possible kidney stone which you may have passed.  Please take your prescribed pain medication as needed and as directed by other previous provider.  If you develop any life-threatening symptoms please return immediately to the emergency department.

## 2024-01-15 DIAGNOSIS — M9902 Segmental and somatic dysfunction of thoracic region: Secondary | ICD-10-CM | POA: Diagnosis not present

## 2024-01-15 DIAGNOSIS — M9901 Segmental and somatic dysfunction of cervical region: Secondary | ICD-10-CM | POA: Diagnosis not present

## 2024-01-29 DIAGNOSIS — M9901 Segmental and somatic dysfunction of cervical region: Secondary | ICD-10-CM | POA: Diagnosis not present

## 2024-01-29 DIAGNOSIS — M9902 Segmental and somatic dysfunction of thoracic region: Secondary | ICD-10-CM | POA: Diagnosis not present

## 2024-02-08 ENCOUNTER — Encounter: Payer: BC Managed Care – PPO | Admitting: Medical

## 2024-02-15 DIAGNOSIS — N760 Acute vaginitis: Secondary | ICD-10-CM | POA: Diagnosis not present

## 2024-05-09 ENCOUNTER — Ambulatory Visit: Admitting: Cardiovascular Disease
